# Patient Record
Sex: Male | Born: 1985 | Race: White | Hispanic: No | Marital: Married | Smoking: Never smoker
Health system: Southern US, Community
[De-identification: ages and names within clinical notes are randomized; demographics above are authoritative.]

## PROBLEM LIST (undated history)

## (undated) DIAGNOSIS — F319 Bipolar disorder, unspecified: Secondary | ICD-10-CM

## (undated) DIAGNOSIS — M109 Gout, unspecified: Secondary | ICD-10-CM

## (undated) DIAGNOSIS — I10 Essential (primary) hypertension: Secondary | ICD-10-CM

## (undated) DIAGNOSIS — G4730 Sleep apnea, unspecified: Secondary | ICD-10-CM

## (undated) DIAGNOSIS — F32A Depression, unspecified: Secondary | ICD-10-CM

## (undated) DIAGNOSIS — F329 Major depressive disorder, single episode, unspecified: Secondary | ICD-10-CM

## (undated) DIAGNOSIS — F419 Anxiety disorder, unspecified: Secondary | ICD-10-CM

## (undated) DIAGNOSIS — E785 Hyperlipidemia, unspecified: Secondary | ICD-10-CM

## (undated) DIAGNOSIS — K219 Gastro-esophageal reflux disease without esophagitis: Secondary | ICD-10-CM

## (undated) DIAGNOSIS — F909 Attention-deficit hyperactivity disorder, unspecified type: Secondary | ICD-10-CM

## (undated) DIAGNOSIS — M549 Dorsalgia, unspecified: Secondary | ICD-10-CM

## (undated) DIAGNOSIS — G8929 Other chronic pain: Secondary | ICD-10-CM

## (undated) HISTORY — DX: Bipolar disorder, unspecified: F31.9

## (undated) HISTORY — PX: KIDNEY STONE SURGERY: SHX686

## (undated) HISTORY — DX: Attention-deficit hyperactivity disorder, unspecified type: F90.9

## (undated) HISTORY — DX: Depression, unspecified: F32.A

## (undated) HISTORY — DX: Hyperlipidemia, unspecified: E78.5

## (undated) HISTORY — PX: APPENDECTOMY: SHX54

## (undated) HISTORY — DX: Anxiety disorder, unspecified: F41.9

## (undated) HISTORY — DX: Other chronic pain: G89.29

## (undated) HISTORY — DX: Dorsalgia, unspecified: M54.9

## (undated) HISTORY — DX: Major depressive disorder, single episode, unspecified: F32.9

## (undated) HISTORY — DX: Gout, unspecified: M10.9

## (undated) HISTORY — DX: Gastro-esophageal reflux disease without esophagitis: K21.9

---
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
## 2004-02-18 ENCOUNTER — Emergency Department: Payer: Self-pay | Admitting: General Practice

## 2005-06-29 ENCOUNTER — Emergency Department: Payer: Self-pay | Admitting: Emergency Medicine

## 2005-07-04 ENCOUNTER — Emergency Department: Payer: Self-pay | Admitting: Emergency Medicine

## 2006-03-22 ENCOUNTER — Other Ambulatory Visit: Payer: Self-pay

## 2006-03-22 ENCOUNTER — Emergency Department: Payer: Self-pay | Admitting: Emergency Medicine

## 2006-05-01 ENCOUNTER — Emergency Department: Payer: Self-pay | Admitting: Emergency Medicine

## 2006-10-14 ENCOUNTER — Emergency Department: Payer: Self-pay | Admitting: Emergency Medicine

## 2006-12-16 ENCOUNTER — Emergency Department: Payer: Self-pay | Admitting: Emergency Medicine

## 2007-05-25 ENCOUNTER — Emergency Department: Payer: Self-pay | Admitting: Emergency Medicine

## 2007-12-05 ENCOUNTER — Emergency Department: Payer: Self-pay | Admitting: Emergency Medicine

## 2009-02-17 ENCOUNTER — Encounter: Payer: Self-pay | Admitting: Family Medicine

## 2009-03-01 ENCOUNTER — Encounter: Payer: Self-pay | Admitting: Family Medicine

## 2010-07-23 ENCOUNTER — Ambulatory Visit: Payer: Self-pay | Admitting: Family Medicine

## 2010-07-25 ENCOUNTER — Ambulatory Visit: Payer: Self-pay | Admitting: Family Medicine

## 2012-09-12 DIAGNOSIS — N209 Urinary calculus, unspecified: Secondary | ICD-10-CM | POA: Insufficient documentation

## 2012-09-12 DIAGNOSIS — F419 Anxiety disorder, unspecified: Secondary | ICD-10-CM | POA: Insufficient documentation

## 2013-08-15 DIAGNOSIS — IMO0002 Reserved for concepts with insufficient information to code with codable children: Secondary | ICD-10-CM | POA: Insufficient documentation

## 2014-08-12 ENCOUNTER — Other Ambulatory Visit: Payer: Self-pay | Admitting: Family Medicine

## 2014-08-19 DIAGNOSIS — E782 Mixed hyperlipidemia: Secondary | ICD-10-CM

## 2014-08-19 DIAGNOSIS — F319 Bipolar disorder, unspecified: Secondary | ICD-10-CM

## 2014-08-19 DIAGNOSIS — G8929 Other chronic pain: Secondary | ICD-10-CM

## 2014-08-19 DIAGNOSIS — R740 Nonspecific elevation of levels of transaminase and lactic acid dehydrogenase [LDH]: Principal | ICD-10-CM

## 2014-08-19 DIAGNOSIS — M109 Gout, unspecified: Secondary | ICD-10-CM

## 2014-08-19 DIAGNOSIS — R7401 Elevation of levels of liver transaminase levels: Secondary | ICD-10-CM | POA: Insufficient documentation

## 2014-08-19 DIAGNOSIS — K219 Gastro-esophageal reflux disease without esophagitis: Secondary | ICD-10-CM | POA: Insufficient documentation

## 2014-08-19 DIAGNOSIS — E1169 Type 2 diabetes mellitus with other specified complication: Secondary | ICD-10-CM | POA: Insufficient documentation

## 2014-08-19 DIAGNOSIS — F909 Attention-deficit hyperactivity disorder, unspecified type: Secondary | ICD-10-CM | POA: Insufficient documentation

## 2014-08-19 DIAGNOSIS — F32A Depression, unspecified: Secondary | ICD-10-CM | POA: Insufficient documentation

## 2014-08-19 DIAGNOSIS — F419 Anxiety disorder, unspecified: Secondary | ICD-10-CM

## 2014-08-19 DIAGNOSIS — M549 Dorsalgia, unspecified: Secondary | ICD-10-CM

## 2014-08-19 DIAGNOSIS — E785 Hyperlipidemia, unspecified: Secondary | ICD-10-CM | POA: Insufficient documentation

## 2014-08-19 DIAGNOSIS — R7402 Elevation of levels of lactic acid dehydrogenase (LDH): Secondary | ICD-10-CM

## 2014-08-19 DIAGNOSIS — F329 Major depressive disorder, single episode, unspecified: Secondary | ICD-10-CM | POA: Insufficient documentation

## 2014-08-19 HISTORY — DX: Bipolar disorder, unspecified: F31.9

## 2014-08-20 ENCOUNTER — Ambulatory Visit: Payer: Self-pay | Admitting: Unknown Physician Specialty

## 2014-08-23 ENCOUNTER — Other Ambulatory Visit: Payer: Self-pay | Admitting: Unknown Physician Specialty

## 2014-08-23 MED ORDER — CLONAZEPAM 0.5 MG PO TABS: 0.5000 mg | ORAL_TABLET | Freq: Two times a day (BID) | ORAL | Status: DC | PRN

## 2014-09-08 ENCOUNTER — Other Ambulatory Visit: Payer: Self-pay | Admitting: Unknown Physician Specialty

## 2014-09-08 MED ORDER — ALLOPURINOL 300 MG PO TABS: 300.0000 mg | ORAL_TABLET | Freq: Every day | ORAL | Status: DC

## 2014-09-08 NOTE — Telephone Encounter (Signed)
I'm changing the sig for the allopurinol, he's not tapering up any more He did not keep last appt with Burna Forts -- please make sure patient has upcoming appt with Malachy Mood; he was supposed to have had liver tests done in June Thank you

## 2014-10-14 ENCOUNTER — Other Ambulatory Visit: Payer: Self-pay | Admitting: Family Medicine

## 2014-10-15 NOTE — Telephone Encounter (Signed)
Forwarding to patient's primary provider

## 2014-11-06 ENCOUNTER — Telehealth: Payer: Self-pay | Admitting: Unknown Physician Specialty

## 2014-11-06 MED ORDER — CLONAZEPAM 0.5 MG PO TABS: 0.5000 mg | ORAL_TABLET | Freq: Two times a day (BID) | ORAL | Status: DC | PRN

## 2014-11-06 NOTE — Telephone Encounter (Signed)
Needs seen further refills 

## 2014-11-06 NOTE — Telephone Encounter (Signed)
Patient was last seen 05/07/14 and practice partner number is [id number].

## 2014-11-06 NOTE — Telephone Encounter (Signed)
Pt would like a refill on klonopin

## 2014-11-14 ENCOUNTER — Other Ambulatory Visit: Payer: Self-pay | Admitting: Unknown Physician Specialty

## 2014-11-15 NOTE — Telephone Encounter (Signed)
PP reviewed; patient was due for repeat liver enzymes with CHERYL 3 months after last draw (would have been due in June) Please schedule appt with CHERYL for patient I'll be glad to refill his medicine in the meantime (last creatinine and uric acid were fine) Thank you

## 2014-11-18 NOTE — Telephone Encounter (Signed)
Pt scheduled for follow up on 11/27/14 @ 8:15am. Thanks.

## 2014-11-27 ENCOUNTER — Ambulatory Visit (INDEPENDENT_AMBULATORY_CARE_PROVIDER_SITE_OTHER): Payer: 59 | Admitting: Unknown Physician Specialty

## 2014-11-27 ENCOUNTER — Encounter: Payer: Self-pay | Admitting: Unknown Physician Specialty

## 2014-11-27 VITALS — BP 139/97 | HR 71 | Temp 98.0°F | Ht 65.6 in | Wt 231.2 lb

## 2014-11-27 DIAGNOSIS — K219 Gastro-esophageal reflux disease without esophagitis: Secondary | ICD-10-CM

## 2014-11-27 DIAGNOSIS — F329 Major depressive disorder, single episode, unspecified: Secondary | ICD-10-CM

## 2014-11-27 DIAGNOSIS — I10 Essential (primary) hypertension: Secondary | ICD-10-CM

## 2014-11-27 DIAGNOSIS — F32A Depression, unspecified: Secondary | ICD-10-CM

## 2014-11-27 DIAGNOSIS — F418 Other specified anxiety disorders: Secondary | ICD-10-CM | POA: Diagnosis not present

## 2014-11-27 DIAGNOSIS — F419 Anxiety disorder, unspecified: Secondary | ICD-10-CM

## 2014-11-27 DIAGNOSIS — M109 Gout, unspecified: Secondary | ICD-10-CM | POA: Diagnosis not present

## 2014-11-27 MED ORDER — ALLOPURINOL 300 MG PO TABS: 300.0000 mg | ORAL_TABLET | Freq: Every day | ORAL | Status: DC

## 2014-11-27 MED ORDER — CLONAZEPAM 0.5 MG PO TABS: 0.5000 mg | ORAL_TABLET | Freq: Two times a day (BID) | ORAL | Status: DC | PRN

## 2014-11-27 NOTE — Progress Notes (Signed)
   BP 139/97 mmHg  Pulse 71  Temp(Src) 98 F (36.7 C)  Ht 5' 5.6" (1.666 m)  Wt 231 lb 3.2 oz (104.872 kg)  BMI 37.78 kg/m2  SpO2 97%   Subjective:    Patient ID: Nathan Haws., male    DOB: [birth year], 29 y.o.   MRN: [id number]  HPI: Nathan Pereyra. is a 29 y.o. male  Chief Complaint  Patient presents with  . Depression  . Follow-up    pt is here for a med follow-up   Anxiety Pt has a long history of anxiety and depression and we have tried multiple medications.  He has been maintained on Clonazepam BID for about 6 years.  He is consistent with no more than twice a day.    Depression screen PHQ 2/9 11/27/2014  Decreased Interest 0  Down, Depressed, Hopeless 0  PHQ - 2 Score 0     Gout No gout attack for a year while on Allopurinol.    GERD Doing well with Nexium QD.  Getting it over the counter.    Relevant past medical, surgical, family and social history reviewed and updated as indicated. Interim medical history since our last visit reviewed. Allergies and medications reviewed and updated.  Review of Systems  Constitutional: Negative.   HENT: Negative.   Eyes: Negative.   Respiratory: Negative.   Cardiovascular: Negative.   Gastrointestinal: Negative.   Endocrine: Negative.   Genitourinary: Negative.   Skin: Negative.   Allergic/Immunologic: Negative.   Neurological: Negative.   Hematological: Negative.   Psychiatric/Behavioral: Negative.     Per HPI unless specifically indicated above     Objective:    BP 139/97 mmHg  Pulse 71  Temp(Src) 98 F (36.7 C)  Ht 5' 5.6" (1.666 m)  Wt 231 lb 3.2 oz (104.872 kg)  BMI 37.78 kg/m2  SpO2 97%  Wt Readings from Last 3 Encounters:  11/27/14 231 lb 3.2 oz (104.872 kg)  05/07/14 235 lb (106.595 kg)    Physical Exam  Constitutional: He is oriented to person, place, and time. He appears well-developed and well-nourished. No distress.  HENT:  Head: Normocephalic and atraumatic.  Eyes: Conjunctivae  and lids are normal. Right eye exhibits no discharge. Left eye exhibits no discharge. No scleral icterus.  Cardiovascular: Normal rate and regular rhythm.   Pulmonary/Chest: Effort normal. No respiratory distress.  Abdominal: Normal appearance and bowel sounds are normal. He exhibits no distension. There is no splenomegaly or hepatomegaly. There is no tenderness.  Musculoskeletal: Normal range of motion.  Neurological: He is alert and oriented to person, place, and time.  Skin: Skin is intact. No rash noted. No pallor.  Psychiatric: He has a normal mood and affect. His behavior is normal. Judgment and thought content normal.    No results found for this or any previous visit.    Assessment & Plan:   Problem List Items Addressed This Visit      Unprioritized   Anxiety and depression   GERD (gastroesophageal reflux disease)   Gout - Primary    Other Visit Diagnoses    Essential hypertension        Noted today.  First time.  Pt ed on weight loss.  Recheck at [facility]       Stable, continue present medications.  Follow up plan: Return in about 4 months (around 03/29/2015) for physical.  We will do labs then.  Special attention to BP/

## 2015-02-10 ENCOUNTER — Other Ambulatory Visit: Payer: Self-pay | Admitting: Unknown Physician Specialty

## 2015-02-10 MED ORDER — CLONAZEPAM 0.5 MG PO TABS: 0.5000 mg | ORAL_TABLET | Freq: Two times a day (BID) | ORAL | Status: DC | PRN

## 2015-02-10 NOTE — Telephone Encounter (Signed)
Pt called stated he needs a refill on Klonopin. Please call when RX is ready for pick up. Thanks.

## 2015-02-10 NOTE — Telephone Encounter (Signed)
Patient was last seen 11/27/14 and has appointment scheduled 03/21/15.

## 2015-02-10 NOTE — Telephone Encounter (Signed)
Called and let patient know rx was ready to be picked up.  

## 2015-03-21 ENCOUNTER — Ambulatory Visit (INDEPENDENT_AMBULATORY_CARE_PROVIDER_SITE_OTHER): Payer: Commercial Managed Care - HMO | Admitting: Unknown Physician Specialty

## 2015-03-21 ENCOUNTER — Encounter: Payer: Self-pay | Admitting: Unknown Physician Specialty

## 2015-03-21 VITALS — BP 141/99 | HR 80 | Temp 98.3°F | Ht 64.7 in | Wt 237.6 lb

## 2015-03-21 DIAGNOSIS — F329 Major depressive disorder, single episode, unspecified: Secondary | ICD-10-CM

## 2015-03-21 DIAGNOSIS — G4700 Insomnia, unspecified: Secondary | ICD-10-CM

## 2015-03-21 DIAGNOSIS — F1020 Alcohol dependence, uncomplicated: Secondary | ICD-10-CM

## 2015-03-21 DIAGNOSIS — Z0000 Encounter for general adult medical examination without abnormal findings: Secondary | ICD-10-CM

## 2015-03-21 DIAGNOSIS — I10 Essential (primary) hypertension: Secondary | ICD-10-CM

## 2015-03-21 DIAGNOSIS — F419 Anxiety disorder, unspecified: Secondary | ICD-10-CM

## 2015-03-21 DIAGNOSIS — E1159 Type 2 diabetes mellitus with other circulatory complications: Secondary | ICD-10-CM | POA: Insufficient documentation

## 2015-03-21 DIAGNOSIS — M109 Gout, unspecified: Secondary | ICD-10-CM | POA: Diagnosis not present

## 2015-03-21 DIAGNOSIS — F418 Other specified anxiety disorders: Secondary | ICD-10-CM

## 2015-03-21 DIAGNOSIS — F32A Depression, unspecified: Secondary | ICD-10-CM

## 2015-03-21 LAB — MICROALBUMIN, URINE WAIVED
Creatinine, Urine Waived: 100 mg/dL (ref 10–300)
Microalb, Ur Waived: 10 mg/L (ref 0–19)
Microalb/Creat Ratio: <30 mg/g (ref <30)

## 2015-03-21 MED ORDER — LISINOPRIL 5 MG PO TABS: 5.0000 mg | ORAL_TABLET | Freq: Every day | ORAL | Status: DC

## 2015-03-21 NOTE — Assessment & Plan Note (Signed)
Start Lisinopril 5 mg daily due to chronic elevated BP and multiple risk factors.

## 2015-03-21 NOTE — Assessment & Plan Note (Signed)
Stable, continue present medications.   

## 2015-03-21 NOTE — Assessment & Plan Note (Signed)
Encouraged to quit.  Pt feels this is the only thing that helps with insomnia.

## 2015-03-21 NOTE — Progress Notes (Signed)
--------------------------------- BP 141/99 mmHg  Pulse 80  Temp(Src) 98.3 F (36.8 C)  Ht 5' 4.7" (1.643 m)  Wt 237 lb 9.6 oz (107.775 kg)  BMI 39.92 kg/m2  SpO2 96%   Subjective:    Patient ID: Nathan Haws., male    DOB: May 22, 1985, 30 y.o.   MRN: [id number]  HPI: Nathan Axley. is a 30 y.o. male  Chief Complaint  Patient presents with  . Annual Exam   Anxiety Taking Clonazepam twice a day.  He has been on a nimber of meds in the past and this is the one thing that worked in long term.  He does drink ETOH which helps him sleep.  Without it, pt states he cannot sleep and has no QOL.  We have tried a number of medications that have not worked in the pase  Gout Gout is stable.    GERD Doing well since Nexium has been OTC Relevant past medical, surgical, family and social history reviewed and updated as indicated. Interim medical history since our last visit reviewed. Allergies and medications reviewed and updated.  Review of Systems  Per HPI unless specifically indicated above     Objective:    BP 141/99 mmHg  Pulse 80  Temp(Src) 98.3 F (36.8 C)  Ht 5' 4.7" (1.643 m)  Wt 237 lb 9.6 oz (107.775 kg)  BMI 39.92 kg/m2  SpO2 96%  Wt Readings from Last 3 Encounters:  03/21/15 237 lb 9.6 oz (107.775 kg)  11/27/14 231 lb 3.2 oz (104.872 kg)  05/07/14 235 lb (106.595 kg)    Physical Exam  Constitutional: He is oriented to person, place, and time. He appears well-developed and well-nourished.  HENT:  Head: Normocephalic.  Right Ear: Tympanic membrane, external ear and ear canal normal.  Left Ear: Tympanic membrane, external ear and ear canal normal.  Mouth/Throat: Uvula is midline, oropharynx is clear and moist and mucous membranes are normal.  Eyes: Pupils are equal, round, and reactive to light.  Cardiovascular: Normal rate, regular rhythm and normal heart sounds.  Exam reveals no gallop and no friction rub.   No murmur heard. Pulmonary/Chest: Effort normal  and breath sounds normal. No respiratory distress.  Abdominal: Soft. Bowel sounds are normal. He exhibits no distension. There is no tenderness.  Musculoskeletal: Normal range of motion.  Neurological: He is alert and oriented to person, place, and time. He has normal reflexes.  Skin: Skin is warm and dry.  Psychiatric: He has a normal mood and affect. His behavior is normal. Judgment and thought content normal.    No results found for this or any previous visit.    Assessment & Plan:   Problem List Items Addressed This Visit      Unprioritized   Anxiety and depression - Primary    Stable, continue present medications.        Gout    Stable, continue present medications.        Relevant Orders   Uric acid   EtOH dependence ([place])    Encouraged to quit.  Pt feels this is the only thing that helps with insomnia.      Insomnia   Hypertension    Start Lisinopril 5 mg daily due to chronic elevated BP and multiple risk factors.        Relevant Medications   lisinopril (PRINIVIL,ZESTRIL) 5 MG tablet   Other Relevant Orders   Comprehensive metabolic panel   Lipid Panel w/o Chol/HDL Ratio   Microalbumin, Urine  Waived   Uric acid    Other Visit Diagnoses    Routine general medical examination at a health care facility        Relevant Orders    HIV antibody    TSH    CBC        Follow up plan: Return in about 1 month (around 04/21/2015) for hypertension f/u.

## 2015-03-22 LAB — CBC
HEMATOCRIT: 47.3 % (ref 37.5–51.0)
Hemoglobin: 16.7 g/dL (ref 12.6–17.7)
MCH: 32.1 pg (ref 26.6–33.0)
MCHC: 35.3 g/dL (ref 31.5–35.7)
MCV: 91 fL (ref 79–97)
Platelets: 200 10*3/uL (ref 150–379)
RBC: 5.2 x10E6/uL (ref 4.14–5.80)
RDW: 13.1 % (ref 12.3–15.4)
WBC: 8.1 10*3/uL (ref 3.4–10.8)

## 2015-03-22 LAB — LIPID PANEL W/O CHOL/HDL RATIO
CHOLESTEROL TOTAL: 279 mg/dL — AB (ref 100–199)
HDL: 26 mg/dL — AB (ref >39)
Triglycerides: 443 mg/dL — ABNORMAL HIGH (ref 0–149)

## 2015-03-22 LAB — COMPREHENSIVE METABOLIC PANEL
ALK PHOS: 93 IU/L (ref 39–117)
ALT: 80 IU/L — AB (ref 0–44)
AST: 49 IU/L — AB (ref 0–40)
Albumin/Globulin Ratio: 1.8 (ref 1.1–2.5)
Albumin: 4.9 g/dL (ref 3.5–5.5)
BILIRUBIN TOTAL: 0.5 mg/dL (ref 0.0–1.2)
BUN/Creatinine Ratio: 13 (ref 8–19)
BUN: 13 mg/dL (ref 6–20)
CHLORIDE: 97 mmol/L (ref 96–106)
CO2: 25 mmol/L (ref 18–29)
CREATININE: 0.98 mg/dL (ref 0.76–1.27)
Calcium: 9.7 mg/dL (ref 8.7–10.2)
GFR calc Af Amer: 119 mL/min/{1.73_m2} (ref >59)
GFR calc non Af Amer: 103 mL/min/{1.73_m2} (ref >59)
GLUCOSE: 87 mg/dL (ref 65–99)
Globulin, Total: 2.8 g/dL (ref 1.5–4.5)
Potassium: 4.4 mmol/L (ref 3.5–5.2)
Sodium: 139 mmol/L (ref 134–144)
Total Protein: 7.7 g/dL (ref 6.0–8.5)

## 2015-03-22 LAB — URIC ACID: Uric Acid: 5.6 mg/dL (ref 3.7–8.6)

## 2015-03-22 LAB — HIV ANTIBODY (ROUTINE TESTING W REFLEX): HIV SCREEN 4TH GENERATION: NONREACTIVE

## 2015-03-22 LAB — TSH: TSH: 0.887 u[IU]/mL (ref 0.450–4.500)

## 2015-03-24 ENCOUNTER — Other Ambulatory Visit: Payer: Commercial Managed Care - HMO

## 2015-03-24 ENCOUNTER — Other Ambulatory Visit: Payer: Self-pay | Admitting: Unknown Physician Specialty

## 2015-03-24 DIAGNOSIS — E781 Pure hyperglyceridemia: Secondary | ICD-10-CM

## 2015-03-25 ENCOUNTER — Telehealth: Payer: Self-pay | Admitting: Unknown Physician Specialty

## 2015-03-25 LAB — LIPID PANEL W/O CHOL/HDL RATIO
Cholesterol, Total: 283 mg/dL — ABNORMAL HIGH (ref 100–199)
HDL: 19 mg/dL — AB (ref >39)
TRIGLYCERIDES: 885 mg/dL — AB (ref 0–149)

## 2015-03-25 MED ORDER — GEMFIBROZIL 600 MG PO TABS: 600.0000 mg | ORAL_TABLET | Freq: Two times a day (BID) | ORAL | Status: DC

## 2015-03-25 NOTE — Progress Notes (Signed)
Phone call with patient about very high Triglycerides.  Discussed lower sugar diet and to decrease ETOH.  Will start Lopid 600 mg BID.  Recehck in 6 weeks

## 2015-03-25 NOTE — Telephone Encounter (Signed)
Pt has an appt scheduled for 04/23/15. Thanks.

## 2015-03-25 NOTE — Telephone Encounter (Signed)
Discussed with patient about very high Triglycerides.  Discussed dangers of pancreatitis and decrease ETOH and sugar.  Start Lopid 600 mg BID.  Recheck in 6 weeks

## 2015-04-23 ENCOUNTER — Ambulatory Visit (INDEPENDENT_AMBULATORY_CARE_PROVIDER_SITE_OTHER): Payer: Commercial Managed Care - HMO | Admitting: Unknown Physician Specialty

## 2015-04-23 ENCOUNTER — Encounter: Payer: Self-pay | Admitting: Unknown Physician Specialty

## 2015-04-23 VITALS — BP 148/100 | HR 77 | Temp 98.4°F | Ht 65.0 in | Wt 230.4 lb

## 2015-04-23 DIAGNOSIS — R7402 Elevation of levels of lactic acid dehydrogenase (LDH): Secondary | ICD-10-CM

## 2015-04-23 DIAGNOSIS — E8881 Metabolic syndrome: Secondary | ICD-10-CM | POA: Diagnosis not present

## 2015-04-23 DIAGNOSIS — E782 Mixed hyperlipidemia: Secondary | ICD-10-CM

## 2015-04-23 DIAGNOSIS — R740 Nonspecific elevation of levels of transaminase and lactic acid dehydrogenase [LDH]: Secondary | ICD-10-CM | POA: Diagnosis not present

## 2015-04-23 DIAGNOSIS — R7401 Elevation of levels of liver transaminase levels: Secondary | ICD-10-CM

## 2015-04-23 DIAGNOSIS — I10 Essential (primary) hypertension: Secondary | ICD-10-CM | POA: Diagnosis not present

## 2015-04-23 MED ORDER — FENOFIBRATE MICRONIZED 134 MG PO CAPS: 134.0000 mg | ORAL_CAPSULE | Freq: Every day | ORAL | Status: DC

## 2015-04-23 NOTE — Progress Notes (Signed)
BP 148/100 mmHg  Pulse 77  Temp(Src) 98.4 F (36.9 C)  Ht 5\' 5"  (1.651 m)  Wt 230 lb 6.4 oz (104.509 kg)  BMI 38.34 kg/m2  SpO2 97%   Subjective:    Patient ID: Nathan Haws., male    DOB: 24-Nov-1985, 30 y.o.   MRN: [id number]  HPI: Nathan Eskra. is a 30 y.o. male  Chief Complaint  Patient presents with  . Hypertension    pt states he did not start taking the lisinopril that was given to him last visit   Hypertension Pt stated that he chose to change his diet and cut down on drinking.  He has lost 7 pounds  Elevated liver enzymes Cut back on drinking  High Triglycerides Wants to see the effect of diet and exercise.  Did not take Lipitor.    Relevant past medical, surgical, family and social history reviewed and updated as indicated. Interim medical history since our last visit reviewed. Allergies and medications reviewed and updated.  Review of Systems  Per HPI unless specifically indicated above     Objective:    BP 148/100 mmHg  Pulse 77  Temp(Src) 98.4 F (36.9 C)  Ht 5\' 5"  (1.651 m)  Wt 230 lb 6.4 oz (104.509 kg)  BMI 38.34 kg/m2  SpO2 97%  Wt Readings from Last 3 Encounters:  04/23/15 230 lb 6.4 oz (104.509 kg)  03/21/15 237 lb 9.6 oz (107.775 kg)  11/27/14 231 lb 3.2 oz (104.872 kg)    Physical Exam  Constitutional: He is oriented to person, place, and time. He appears well-developed and well-nourished. No distress.  HENT:  Head: Normocephalic and atraumatic.  Eyes: Conjunctivae and lids are normal. Right eye exhibits no discharge. Left eye exhibits no discharge. No scleral icterus.  Neck: Normal range of motion. Neck supple. No JVD present. Carotid bruit is not present.  Cardiovascular: Normal rate, regular rhythm and normal heart sounds.   Pulmonary/Chest: Effort normal and breath sounds normal. No respiratory distress.  Abdominal: Normal appearance. There is no splenomegaly or hepatomegaly.  Musculoskeletal: Normal range of motion.   Neurological: He is alert and oriented to person, place, and time.  Skin: Skin is warm, dry and intact. No rash noted. No pallor.  Psychiatric: He has a normal mood and affect. His behavior is normal. Judgment and thought content normal.    Results for orders placed or performed in visit on 03/24/15  Lipid Panel w/o Chol/HDL Ratio  Result Value Ref Range   Cholesterol, Total 283 (H) 100 - 199 mg/dL   Triglycerides 885 (HH) 0 - 149 mg/dL   HDL 19 (L) >39 mg/dL   VLDL Cholesterol Cal Comment 5 - 40 mg/dL   LDL Calculated Comment 0 - 99 mg/dL      Assessment & Plan:   Problem List Items Addressed This Visit      Unprioritized   Nonspecific elevation of levels of transaminase or lactic acid dehydrogenase (LDH)    Check CMP      Relevant Orders   Comprehensive metabolic panel   Mixed hyperlipidemia    Sample today was lipemic.  Start Arnell Asal      Relevant Medications   fenofibrate micronized (LOFIBRA) 134 MG capsule   Other Relevant Orders   Lipid Panel [place]col[place], Waived   Hypertension - Primary    Pt agrees to start Lisinopril.  He has a prescription.        Relevant Medications   fenofibrate micronized (LOFIBRA) 134  MG capsule   Metabolic syndrome       Follow up plan: Return in about 3 months (around 07/21/2015).

## 2015-04-23 NOTE — Assessment & Plan Note (Signed)
Check CMP.  ?

## 2015-04-23 NOTE — Assessment & Plan Note (Signed)
Sample today was lipemic.  Start [place]

## 2015-04-23 NOTE — Assessment & Plan Note (Signed)
Pt agrees to start Lisinopril.  He has a prescription.

## 2015-04-24 LAB — COMPREHENSIVE METABOLIC PANEL
A/G RATIO: 1.9 (ref 1.1–2.5)
ALBUMIN: 4.5 g/dL (ref 3.5–5.5)
ALK PHOS: 80 IU/L (ref 39–117)
ALT: 75 IU/L — ABNORMAL HIGH (ref 0–44)
AST: 47 IU/L — AB (ref 0–40)
BUN / CREAT RATIO: 12 (ref 8–19)
BUN: 12 mg/dL (ref 6–20)
Bilirubin Total: 0.6 mg/dL (ref 0.0–1.2)
CO2: 24 mmol/L (ref 18–29)
CREATININE: 0.99 mg/dL (ref 0.76–1.27)
Calcium: 9.6 mg/dL (ref 8.7–10.2)
Chloride: 99 mmol/L (ref 96–106)
GFR calc Af Amer: 118 mL/min/{1.73_m2} (ref >59)
GFR calc non Af Amer: 102 mL/min/{1.73_m2} (ref >59)
GLOBULIN, TOTAL: 2.4 g/dL (ref 1.5–4.5)
Glucose: 84 mg/dL (ref 65–99)
POTASSIUM: 4.8 mmol/L (ref 3.5–5.2)
SODIUM: 138 mmol/L (ref 134–144)
Total Protein: 6.9 g/dL (ref 6.0–8.5)

## 2015-04-24 LAB — LIPID PANEL W/O CHOL/HDL RATIO
CHOLESTEROL TOTAL: 255 mg/dL — AB (ref 100–199)
HDL: 22 mg/dL — ABNORMAL LOW (ref >39)
LDL CALC: 156 mg/dL — AB (ref 0–99)
Triglycerides: 384 mg/dL — ABNORMAL HIGH (ref 0–149)
VLDL Cholesterol Cal: 77 mg/dL — ABNORMAL HIGH (ref 5–40)

## 2015-04-24 LAB — SPECIMEN STATUS REPORT

## 2015-04-25 ENCOUNTER — Encounter: Payer: Self-pay | Admitting: Unknown Physician Specialty

## 2015-04-25 NOTE — Progress Notes (Signed)
Quick Note:  Letter sent. Triglycerides better and liver enzymes stable. Continue diet and exercise changes and I expect Triglycerides to continue decreasing. ______

## 2015-06-13 ENCOUNTER — Other Ambulatory Visit: Payer: Self-pay | Admitting: Unknown Physician Specialty

## 2015-07-23 ENCOUNTER — Ambulatory Visit: Payer: Commercial Managed Care - HMO | Admitting: Unknown Physician Specialty

## 2015-08-11 ENCOUNTER — Other Ambulatory Visit: Payer: Self-pay | Admitting: Unknown Physician Specialty

## 2015-08-14 ENCOUNTER — Telehealth: Payer: Self-pay | Admitting: Unknown Physician Specialty

## 2015-08-14 NOTE — Telephone Encounter (Signed)
Pt needs refill for clonazePAM (KLONOPIN) 0.5 MG tablet

## 2015-08-15 NOTE — Telephone Encounter (Signed)
Called rx into pharmacy.

## 2015-08-15 NOTE — Telephone Encounter (Signed)
Rx was written 08/12/15. Called patient to find out which pharmacy patient would like it called into and he stated [facility].

## 2015-08-25 ENCOUNTER — Other Ambulatory Visit: Payer: Self-pay | Admitting: Unknown Physician Specialty

## 2015-10-31 ENCOUNTER — Other Ambulatory Visit: Payer: Self-pay | Admitting: Unknown Physician Specialty

## 2015-11-03 ENCOUNTER — Other Ambulatory Visit: Payer: Self-pay | Admitting: Unknown Physician Specialty

## 2015-11-04 NOTE — Telephone Encounter (Signed)
Called and scheduled patient an appointment for 12/03/15.

## 2015-11-04 NOTE — Telephone Encounter (Signed)
Needs appointment for follow up ASAP- will get him enough to last to that appointment once booked

## 2015-12-03 ENCOUNTER — Ambulatory Visit: Payer: Self-pay | Admitting: Unknown Physician Specialty

## 2015-12-26 ENCOUNTER — Ambulatory Visit (INDEPENDENT_AMBULATORY_CARE_PROVIDER_SITE_OTHER): Payer: Commercial Managed Care - HMO | Admitting: Unknown Physician Specialty

## 2015-12-26 ENCOUNTER — Encounter: Payer: Self-pay | Admitting: Unknown Physician Specialty

## 2015-12-26 DIAGNOSIS — I10 Essential (primary) hypertension: Secondary | ICD-10-CM | POA: Diagnosis not present

## 2015-12-26 DIAGNOSIS — R000 Tachycardia, unspecified: Secondary | ICD-10-CM

## 2015-12-26 MED ORDER — METOPROLOL SUCCINATE ER 25 MG PO TB24: 25.0000 mg | ORAL_TABLET | Freq: Every day | ORAL | 1 refills | Status: DC

## 2015-12-26 NOTE — Assessment & Plan Note (Signed)
Rx for Metoprolol to help slow down heart rate and control BP

## 2015-12-26 NOTE — Assessment & Plan Note (Signed)
Poor control at times

## 2015-12-26 NOTE — Progress Notes (Signed)
BP (!) 136/95 (BP Location: Left Arm, Cuff Size: Large)   Pulse (!) 102   Temp 98.5 F (36.9 C)   Ht 5' 6.2" (1.681 m) Comment: pt had shoes on  Wt 240 lb 9.6 oz (109.1 kg) Comment: pt had shoes on  SpO2 97%   BMI 38.60 kg/m    Subjective:    Patient ID: Nathan Haws., male    DOB: 27-Mar-1985, 30 y.o.   MRN: [id number]  HPI: [name]. is a 30 y.o. male  Chief Complaint  Patient presents with  . ER Follow Up    pt states he went to the ER at [facility] for hypertension and tachycardia    Reviewed ER notes.  Pt presented to the ER for tachycardia and high blood pressure.  ER diagnosed pt with this due to gastric distress.  He is not sure that is the issue.  Anxiety wasn't the issue according to the pt.  He is already on Lisinopril.    Relevant past medical, surgical, family and social history reviewed and updated as indicated. Interim medical history since our last visit reviewed. Allergies and medications reviewed and updated.  Review of Systems  Per HPI unless specifically indicated above     Objective:    BP (!) 136/95 (BP Location: Left Arm, Cuff Size: Large)   Pulse (!) 102   Temp 98.5 F (36.9 C)   Ht 5' 6.2" (1.681 m) Comment: pt had shoes on  Wt 240 lb 9.6 oz (109.1 kg) Comment: pt had shoes on  SpO2 97%   BMI 38.60 kg/m   Wt Readings from Last 3 Encounters:  12/26/15 240 lb 9.6 oz (109.1 kg)  04/23/15 230 lb 6.4 oz (104.5 kg)  03/21/15 237 lb 9.6 oz (107.8 kg)    Physical Exam  Constitutional: He is oriented to person, place, and time. He appears well-developed and well-nourished. No distress.  HENT:  Head: Normocephalic and atraumatic.  Eyes: Conjunctivae and lids are normal. Right eye exhibits no discharge. Left eye exhibits no discharge. No scleral icterus.  Neck: Normal range of motion. Neck supple. No JVD present. Carotid bruit is not present.  Cardiovascular: Normal rate, regular rhythm and normal heart sounds.   Pulmonary/Chest: Effort  normal and breath sounds normal. No respiratory distress.  Abdominal: Normal appearance. There is no splenomegaly or hepatomegaly.  Musculoskeletal: Normal range of motion.  Neurological: He is alert and oriented to person, place, and time.  Skin: Skin is warm, dry and intact. No rash noted. No pallor.  Psychiatric: He has a normal mood and affect. His behavior is normal. Judgment and thought content normal.    Results for orders placed or performed in visit on 04/23/15  Comprehensive metabolic panel  Result Value Ref Range   Glucose 84 65 - 99 mg/dL   BUN 12 6 - 20 mg/dL   Creatinine, Ser 0.99 0.76 - 1.27 mg/dL   GFR calc non Af Amer 102 >59 mL/min/1.73   GFR calc Af Amer 118 >59 mL/min/1.73   BUN/Creatinine Ratio 12 8 - 19   Sodium 138 134 - 144 mmol/L   Potassium 4.8 3.5 - 5.2 mmol/L   Chloride 99 96 - 106 mmol/L   CO2 24 18 - 29 mmol/L   Calcium 9.6 8.7 - 10.2 mg/dL   Total Protein 6.9 6.0 - 8.5 g/dL   Albumin 4.5 3.5 - 5.5 g/dL   Globulin, Total 2.4 1.5 - 4.5 g/dL   Albumin/Globulin Ratio 1.9  1.1 - 2.5   Bilirubin Total 0.6 0.0 - 1.2 mg/dL   Alkaline Phosphatase 80 39 - 117 IU/L   AST 47 (H) 0 - 40 IU/L   ALT 75 (H) 0 - 44 IU/L  Lipid Panel w/o Chol/HDL Ratio  Result Value Ref Range   Cholesterol, Total 255 (H) 100 - 199 mg/dL   Triglycerides 384 (H) 0 - 149 mg/dL   HDL 22 (L) >39 mg/dL   VLDL Cholesterol Cal 77 (H) 5 - 40 mg/dL   LDL Calculated 156 (H) 0 - 99 mg/dL  Specimen status report  Result Value Ref Range   specimen status report Comment       Assessment & Plan:   Problem List Items Addressed This Visit      Unprioritized   Hypertension    Poor control at times      Relevant Medications   metoprolol succinate (TOPROL-XL) 25 MG 24 hr tablet   Tachycardia    Rx for Metoprolol to help slow down heart rate and control BP       Other Visit Diagnoses   None.      Follow up plan: Return in about 4 weeks (around 01/23/2016).

## 2015-12-29 ENCOUNTER — Other Ambulatory Visit: Payer: Self-pay | Admitting: Unknown Physician Specialty

## 2016-01-20 ENCOUNTER — Other Ambulatory Visit: Payer: Self-pay | Admitting: Unknown Physician Specialty

## 2016-01-25 ENCOUNTER — Other Ambulatory Visit: Payer: Self-pay | Admitting: Unknown Physician Specialty

## 2016-01-28 ENCOUNTER — Encounter: Payer: Self-pay | Admitting: Unknown Physician Specialty

## 2016-01-28 ENCOUNTER — Ambulatory Visit (INDEPENDENT_AMBULATORY_CARE_PROVIDER_SITE_OTHER): Payer: Commercial Managed Care - HMO | Admitting: Unknown Physician Specialty

## 2016-01-28 DIAGNOSIS — G4700 Insomnia, unspecified: Secondary | ICD-10-CM | POA: Diagnosis not present

## 2016-01-28 DIAGNOSIS — R7401 Elevation of levels of liver transaminase levels: Secondary | ICD-10-CM

## 2016-01-28 DIAGNOSIS — E782 Mixed hyperlipidemia: Secondary | ICD-10-CM

## 2016-01-28 DIAGNOSIS — F1020 Alcohol dependence, uncomplicated: Secondary | ICD-10-CM

## 2016-01-28 DIAGNOSIS — I10 Essential (primary) hypertension: Secondary | ICD-10-CM | POA: Diagnosis not present

## 2016-01-28 DIAGNOSIS — R740 Nonspecific elevation of levels of transaminase and lactic acid dehydrogenase [LDH]: Secondary | ICD-10-CM

## 2016-01-28 MED ORDER — LISINOPRIL 10 MG PO TABS: 10.0000 mg | ORAL_TABLET | Freq: Every day | ORAL | 3 refills | Status: DC

## 2016-01-28 NOTE — Progress Notes (Signed)
BP (!) 146/95 (BP Location: Left Arm, Patient Position: Sitting, Cuff Size: Large)   Pulse 89   Temp 98.3 F (36.8 C)   Wt 243 lb 3.2 oz (110.3 kg)   SpO2 97%   BMI 39.02 kg/m    Subjective:    Patient ID: [name]., male    DOB: December 22, 1985, 30 y.o.   MRN: [id number]  HPI: [name]. is a 30 y.o. male  Chief Complaint  Patient presents with  . Hypertension    pt states it has been a ruff morning at work    Hypertension Using medications without difficulty Average home BPs Not checking  No problems or lightheadedness No chest pain with exertion or shortness of breath No Edema Palpitations improved from last visit with the addition of Metoprolol Relevant past medical, surgical, family and social history reviewed and updated as indicated. Interim medical history since our last visit reviewed. Allergies and medications reviewed and updated.  ETOH Alcohol intake has been cut back to 1/2 from where he was 6 months ago.  States it is the only thing he can do to help him sleep at night.  States this is the only thing that keeps him working    Review of Systems  Per HPI unless specifically indicated above     Objective:    BP (!) 146/95 (BP Location: Left Arm, Patient Position: Sitting, Cuff Size: Large)   Pulse 89   Temp 98.3 F (36.8 C)   Wt 243 lb 3.2 oz (110.3 kg)   SpO2 97%   BMI 39.02 kg/m   Wt Readings from Last 3 Encounters:  01/28/16 243 lb 3.2 oz (110.3 kg)  12/26/15 240 lb 9.6 oz (109.1 kg)  04/23/15 230 lb 6.4 oz (104.5 kg)    Physical Exam  Constitutional: He is oriented to person, place, and time. He appears well-developed and well-nourished. No distress.  HENT:  Head: Normocephalic and atraumatic.  Eyes: Conjunctivae and lids are normal. Right eye exhibits no discharge. Left eye exhibits no discharge. No scleral icterus.  Neck: Normal range of motion. Neck supple. No JVD present. Carotid bruit is not present.  Cardiovascular: Normal  rate, regular rhythm and normal heart sounds.   Pulmonary/Chest: Effort normal and breath sounds normal. No respiratory distress.  Abdominal: Normal appearance. There is no splenomegaly or hepatomegaly.  Musculoskeletal: Normal range of motion.  Neurological: He is alert and oriented to person, place, and time.  Skin: Skin is warm, dry and intact. No rash noted. No pallor.  Psychiatric: He has a normal mood and affect. His behavior is normal. Judgment and thought content normal.    Results for orders placed or performed in visit on 04/23/15  Comprehensive metabolic panel  Result Value Ref Range   Glucose 84 65 - 99 mg/dL   BUN 12 6 - 20 mg/dL   Creatinine, Ser 0.99 0.76 - 1.27 mg/dL   GFR calc non Af Amer 102 >59 mL/min/1.73   GFR calc Af Amer 118 >59 mL/min/1.73   BUN/Creatinine Ratio 12 8 - 19   Sodium 138 134 - 144 mmol/L   Potassium 4.8 3.5 - 5.2 mmol/L   Chloride 99 96 - 106 mmol/L   CO2 24 18 - 29 mmol/L   Calcium 9.6 8.7 - 10.2 mg/dL   Total Protein 6.9 6.0 - 8.5 g/dL   Albumin 4.5 3.5 - 5.5 g/dL   Globulin, Total 2.4 1.5 - 4.5 g/dL   Albumin/Globulin Ratio 1.9 1.1 -  2.5   Bilirubin Total 0.6 0.0 - 1.2 mg/dL   Alkaline Phosphatase 80 39 - 117 IU/L   AST 47 (H) 0 - 40 IU/L   ALT 75 (H) 0 - 44 IU/L  Lipid Panel w/o Chol/HDL Ratio  Result Value Ref Range   Cholesterol, Total 255 (H) 100 - 199 mg/dL   Triglycerides 384 (H) 0 - 149 mg/dL   HDL 22 (L) >39 mg/dL   VLDL Cholesterol Cal 77 (H) 5 - 40 mg/dL   LDL Calculated 156 (H) 0 - 99 mg/dL  Specimen status report  Result Value Ref Range   specimen status report Comment       Assessment & Plan:   Problem List Items Addressed This Visit      Unprioritized   EtOH dependence ([place])    Discussed.  Unable to quit at this time      Hypertension    Not to goal.  Increase Lisinopril to 10 mg.        Relevant Medications   lisinopril (PRINIVIL,ZESTRIL) 10 MG tablet   Other Relevant Orders   Comprehensive metabolic  panel   Insomnia    Discussed sleep and ETOH       Mixed hyperlipidemia    Check today      Relevant Medications   lisinopril (PRINIVIL,ZESTRIL) 10 MG tablet   Other Relevant Orders   Comprehensive metabolic panel   Lipid Panel w/o Chol/HDL Ratio   Nonspecific elevation of levels of transaminase or lactic acid dehydrogenase (LDH)    Check CMP today          Follow up plan: Return in about 4 weeks (around 02/25/2016).

## 2016-01-28 NOTE — Assessment & Plan Note (Signed)
Check CMP today 

## 2016-01-28 NOTE — Assessment & Plan Note (Signed)
Check today 

## 2016-01-28 NOTE — Assessment & Plan Note (Signed)
Discussed.  Unable to quit at this time

## 2016-01-28 NOTE — Assessment & Plan Note (Signed)
Not to goal.  Increase Lisinopril to 10 mg.

## 2016-01-28 NOTE — Assessment & Plan Note (Signed)
Discussed sleep and ETOH

## 2016-01-28 NOTE — Patient Instructions (Addendum)
Try [employer] or [employer] for CBT for sleep  [place]

## 2016-01-29 ENCOUNTER — Other Ambulatory Visit: Payer: Self-pay | Admitting: Unknown Physician Specialty

## 2016-01-29 LAB — COMPREHENSIVE METABOLIC PANEL
A/G RATIO: 1.6 (ref 1.2–2.2)
ALBUMIN: 4.6 g/dL (ref 3.5–5.5)
ALT: 102 IU/L — ABNORMAL HIGH (ref 0–44)
AST: 82 IU/L — ABNORMAL HIGH (ref 0–40)
Alkaline Phosphatase: 97 IU/L (ref 39–117)
BILIRUBIN TOTAL: 0.5 mg/dL (ref 0.0–1.2)
BUN / CREAT RATIO: 10 (ref 9–20)
BUN: 11 mg/dL (ref 6–20)
CHLORIDE: 96 mmol/L (ref 96–106)
CO2: 22 mmol/L (ref 18–29)
Calcium: 9.5 mg/dL (ref 8.7–10.2)
Creatinine, Ser: 1.05 mg/dL (ref 0.76–1.27)
GFR calc non Af Amer: 94 mL/min/{1.73_m2} (ref >59)
GFR, EST AFRICAN AMERICAN: 109 mL/min/{1.73_m2} (ref >59)
GLOBULIN, TOTAL: 2.9 g/dL (ref 1.5–4.5)
GLUCOSE: 92 mg/dL (ref 65–99)
Potassium: 4.7 mmol/L (ref 3.5–5.2)
SODIUM: 137 mmol/L (ref 134–144)
TOTAL PROTEIN: 7.5 g/dL (ref 6.0–8.5)

## 2016-01-29 LAB — LIPID PANEL W/O CHOL/HDL RATIO
CHOLESTEROL TOTAL: 295 mg/dL — AB (ref 100–199)
HDL: 17 mg/dL — AB (ref >39)
TRIGLYCERIDES: 797 mg/dL — AB (ref 0–149)

## 2016-03-09 ENCOUNTER — Ambulatory Visit: Payer: Commercial Managed Care - HMO | Admitting: Unknown Physician Specialty

## 2016-03-10 ENCOUNTER — Other Ambulatory Visit: Payer: Self-pay | Admitting: Unknown Physician Specialty

## 2016-03-24 ENCOUNTER — Ambulatory Visit (INDEPENDENT_AMBULATORY_CARE_PROVIDER_SITE_OTHER): Payer: Commercial Managed Care - HMO | Admitting: Unknown Physician Specialty

## 2016-03-24 ENCOUNTER — Encounter: Payer: Self-pay | Admitting: Unknown Physician Specialty

## 2016-03-24 VITALS — BP 140/95 | HR 92 | Temp 97.8°F | Ht 65.9 in | Wt 243.6 lb

## 2016-03-24 DIAGNOSIS — M1A9XX0 Chronic gout, unspecified, without tophus (tophi): Secondary | ICD-10-CM | POA: Diagnosis not present

## 2016-03-24 DIAGNOSIS — I10 Essential (primary) hypertension: Secondary | ICD-10-CM | POA: Diagnosis not present

## 2016-03-24 DIAGNOSIS — F419 Anxiety disorder, unspecified: Secondary | ICD-10-CM

## 2016-03-24 DIAGNOSIS — R05 Cough: Secondary | ICD-10-CM

## 2016-03-24 DIAGNOSIS — F418 Other specified anxiety disorders: Secondary | ICD-10-CM

## 2016-03-24 DIAGNOSIS — F329 Major depressive disorder, single episode, unspecified: Secondary | ICD-10-CM

## 2016-03-24 DIAGNOSIS — F32A Depression, unspecified: Secondary | ICD-10-CM

## 2016-03-24 DIAGNOSIS — F1020 Alcohol dependence, uncomplicated: Secondary | ICD-10-CM

## 2016-03-24 DIAGNOSIS — E782 Mixed hyperlipidemia: Secondary | ICD-10-CM

## 2016-03-24 DIAGNOSIS — R059 Cough, unspecified: Secondary | ICD-10-CM

## 2016-03-24 NOTE — Progress Notes (Signed)
BP (!) 140/95   Pulse 92   Temp 97.8 F (36.6 C)   Ht 5' 5.9" (1.674 m) Comment: pt had shoes on  Wt 243 lb 9.6 oz (110.5 kg) Comment: pt had shoes on  SpO2 98%   BMI 39.44 kg/m    Subjective:    Patient ID: Nathan Haws., male    DOB: [birth year], 31 y.o.   MRN: [id number]  HPI: Nathan Cucinella. is a 31 y.o. male  Chief Complaint  Patient presents with  . Hypertension    pt states he has been taking the 5 mg of lisinopril for the past week by accident. States he took the 10 mg yesterday and today though.     Hypertension Patient reports he accidentally took only 5 mg of his lisinopril daily last week instead of 10 mg. Notes he was taking correct dosage until last week and has resumed the 10 mg for the past two days. States he has been making an effort to eat healthier lately. He does not exercise regularly, but notes that he walks and does a lot of stairs at work. Denies any edema, chest pain or shortness of breath. Admits to some headaches, which he attributes to sinus pressure he has been experiencing with a recent URI.   Anxiety Taking medications without difficulty. Notes he takes at least one clonazepam each night before bed and takes second only when needed, which he reports is about once or twice a month.  EtOH Still drinking roughly the same amount as he was at his last visit. States it is the only thing that helps him sleep at night.   Gout Taking medication without difficulty. Briefly ran out of his medicine recently, but was able to fill and resume taking it within about a week. Has not experienced any gout flares recently.  URI Patient states he has been experiencing a productive cough with some congestion, rhinorrhea, sinus pressure and sneezing for approximately 2 weeks. Reports symptoms were improving on OTC mucinex, however this medicine was causing some decreased appetite so he discontinued it. Noticed symptoms began worsening again after stopping the  mucinex and states that he plans to resume taking it today.  Relevant past medical, surgical, family and social history reviewed and updated as indicated. Interim medical history since our last visit reviewed. Allergies and medications reviewed and updated.  Review of Systems  Constitutional: Positive for activity change.  HENT: Positive for congestion, postnasal drip, rhinorrhea, sinus pain, sinus pressure and sneezing. Negative for ear pain, sore throat and trouble swallowing.   Eyes: Negative for pain, redness and visual disturbance.  Respiratory: Positive for cough. Negative for chest tightness, shortness of breath and wheezing.        SOB with exertion after 3rd floor  Cardiovascular: Negative for chest pain, palpitations and leg swelling.  Gastrointestinal: Positive for diarrhea and nausea. Negative for abdominal pain, constipation and vomiting.       Believes diarrhea is related to alcohol use  Endocrine: Negative for polydipsia and polyuria.  Genitourinary: Negative.   Musculoskeletal: Negative.   Skin: Negative for rash.  Neurological: Positive for headaches.       Believes these are related to some sinus pressure he has been experiencing.  Psychiatric/Behavioral: Negative.     Per HPI unless specifically indicated above     Objective:    BP (!) 140/95   Pulse 92   Temp 97.8 F (36.6 C)   Ht 5' 5.9" (1.674 m)  Comment: pt had shoes on  Wt 243 lb 9.6 oz (110.5 kg) Comment: pt had shoes on  SpO2 98%   BMI 39.44 kg/m   Wt Readings from Last 3 Encounters:  03/24/16 243 lb 9.6 oz (110.5 kg)  01/28/16 243 lb 3.2 oz (110.3 kg)  12/26/15 240 lb 9.6 oz (109.1 kg)    Physical Exam  Constitutional: He is oriented to person, place, and time. He appears well-developed and well-nourished. No distress.  HENT:  Head: Normocephalic and atraumatic.  Eyes: Conjunctivae are normal. Right eye exhibits no discharge. Left eye exhibits no discharge. No scleral icterus.  Cardiovascular:  Normal rate, regular rhythm and normal heart sounds.   Pulmonary/Chest: Effort normal and breath sounds normal. No respiratory distress. He has no wheezes. He has no rales.  Neurological: He is alert and oriented to person, place, and time. He has normal reflexes.  Skin: Skin is warm and dry. He is not diaphoretic.  Psychiatric: He has a normal mood and affect. His behavior is normal. Judgment and thought content normal.  Nursing note and vitals reviewed.   Results for orders placed or performed in visit on 01/28/16  Comprehensive metabolic panel  Result Value Ref Range   Glucose 92 65 - 99 mg/dL   BUN 11 6 - 20 mg/dL   Creatinine, Ser 1.05 0.76 - 1.27 mg/dL   GFR calc non Af Amer 94 >59 mL/min/1.73   GFR calc Af Amer 109 >59 mL/min/1.73   BUN/Creatinine Ratio 10 9 - 20   Sodium 137 134 - 144 mmol/L   Potassium 4.7 3.5 - 5.2 mmol/L   Chloride 96 96 - 106 mmol/L   CO2 22 18 - 29 mmol/L   Calcium 9.5 8.7 - 10.2 mg/dL   Total Protein 7.5 6.0 - 8.5 g/dL   Albumin 4.6 3.5 - 5.5 g/dL   Globulin, Total 2.9 1.5 - 4.5 g/dL   Albumin/Globulin Ratio 1.6 1.2 - 2.2   Bilirubin Total 0.5 0.0 - 1.2 mg/dL   Alkaline Phosphatase 97 39 - 117 IU/L   AST 82 (H) 0 - 40 IU/L   ALT 102 (H) 0 - 44 IU/L  Lipid Panel w/o Chol/HDL Ratio  Result Value Ref Range   Cholesterol, Total 295 (H) 100 - 199 mg/dL   Triglycerides 797 (HH) 0 - 149 mg/dL   HDL 17 (L) >39 mg/dL   VLDL Cholesterol Cal Comment 5 - 40 mg/dL   LDL Calculated Comment 0 - 99 mg/dL      Assessment & Plan:   Problem List Items Addressed This Visit      Cardiovascular and Mediastinum   Hypertension    Blood pressure high today. Patient will resume 10 mg lisinopril and return in 3 months for follow-up.        Other   Mixed hyperlipidemia    Lipid panel will be drawn at the next visit when patient is fasting.      Anxiety and depression    Will continue on current medication.      Gout    Stable. Will continue on current  medication.       Other Visit Diagnoses    Cough    -  Primary   Patient advised to continue with OTC mucinex, rest and drink plenty of hydrating fluids.       Follow up plan: Return in about 3 months (around 06/22/2016).

## 2016-03-24 NOTE — Assessment & Plan Note (Signed)
Stable. Will continue on current medication.

## 2016-03-24 NOTE — Assessment & Plan Note (Addendum)
Lipid panel will be drawn at next visit, when patient is fasting.

## 2016-03-24 NOTE — Assessment & Plan Note (Addendum)
Blood pressure high today. Patient will resume 10 mg lisinopril and return in 3 months for follow-up.

## 2016-03-24 NOTE — Assessment & Plan Note (Signed)
Will continue on current medication. 

## 2016-03-24 NOTE — Assessment & Plan Note (Signed)
Discussed. Unable to quit at this time.

## 2016-04-11 ENCOUNTER — Other Ambulatory Visit: Payer: Self-pay | Admitting: Family Medicine

## 2016-04-12 NOTE — Telephone Encounter (Signed)
Routing to provider. Appt 06/23/16.

## 2016-05-02 ENCOUNTER — Other Ambulatory Visit: Payer: Self-pay | Admitting: Unknown Physician Specialty

## 2016-05-17 ENCOUNTER — Other Ambulatory Visit: Payer: Self-pay | Admitting: Unknown Physician Specialty

## 2016-06-15 ENCOUNTER — Other Ambulatory Visit: Payer: Self-pay | Admitting: Unknown Physician Specialty

## 2016-06-17 ENCOUNTER — Telehealth: Payer: Self-pay | Admitting: Unknown Physician Specialty

## 2016-06-17 NOTE — Telephone Encounter (Signed)
Called and let patient know what Dr. Johnson said.  

## 2016-06-17 NOTE — Telephone Encounter (Signed)
Patient feels he has been having some issues with low blood sugars. He has scheduled appt for 10:30 tomorrow to see [name] but wanted to know should he fast for this appt in case she needs to do labs on him.  Thanks

## 2016-06-17 NOTE — Telephone Encounter (Signed)
Dr. Wynetta Emery, would patient need to be fasting?

## 2016-06-17 NOTE — Telephone Encounter (Signed)
I don't think he'll need to fast, and if he's having low blood sugars, I want him to eat before he comes in.

## 2016-06-18 ENCOUNTER — Encounter: Payer: Self-pay | Admitting: Unknown Physician Specialty

## 2016-06-18 ENCOUNTER — Ambulatory Visit (INDEPENDENT_AMBULATORY_CARE_PROVIDER_SITE_OTHER): Payer: Commercial Managed Care - HMO | Admitting: Unknown Physician Specialty

## 2016-06-18 DIAGNOSIS — I10 Essential (primary) hypertension: Secondary | ICD-10-CM

## 2016-06-18 DIAGNOSIS — E8881 Metabolic syndrome: Secondary | ICD-10-CM

## 2016-06-18 LAB — BAYER DCA HB A1C WAIVED: HB A1C: 5 % (ref <7.0)

## 2016-06-18 MED ORDER — LISINOPRIL 20 MG PO TABS: 20.0000 mg | ORAL_TABLET | Freq: Every day | ORAL | 3 refills | Status: DC

## 2016-06-18 NOTE — Assessment & Plan Note (Signed)
Not to goal.  Increase Lisinopril to 20 mg.

## 2016-06-18 NOTE — Assessment & Plan Note (Addendum)
Worsening symptoms.  Discussed symptoms associated with lower carb and changing body metabolism.  He was fasting when symptoms happened.

## 2016-06-18 NOTE — Progress Notes (Signed)
BP (!) 158/106 (BP Location: Left Arm, Cuff Size: Large)   Pulse 78   Temp 98.1 F (36.7 C)   Wt 240 lb 12.8 oz (109.2 kg)   SpO2 98%   BMI 38.98 kg/m    Subjective:    Patient ID: [name]., male    DOB: [birth year], 31 y.o.   MRN: [id number]  HPI: [name]. is a 31 y.o. male  Chief Complaint  Patient presents with  . other    pt states that he had a weird/strange feeling yesterday. States that it felt like slow reaction times to things throughout the day. Wonders if it could be his blood sugar.    Pt states that he was feeling poorly as described above through the day.  States he got a doughnut and felt fine the rest of the day.  Pt states he has been moody.   Hypertension Poorly controlled today Using medications without difficulty Average home BPs Not checking   No problems or lightheadedness No chest pain with exertion or shortness of breath No Edema  Past Medical History:  Diagnosis Date  . ADHD (attention deficit hyperactivity disorder)   . Anxiety   . Bipolar 1 disorder ([place])   . Chronic back pain   . Depression   . GERD (gastroesophageal reflux disease)   . Gout   . Hyperlipidemia    Family History  Problem Relation Age of Onset  . Hyperlipidemia Father   . Cancer Paternal Grandmother     pancreatic      Relevant past medical, surgical, family and social history reviewed and updated as indicated. Interim medical history since our last visit reviewed. Allergies and medications reviewed and updated.  Review of Systems  Constitutional: Positive for fatigue.  HENT: Negative.   Respiratory: Negative.   Cardiovascular: Negative.   Musculoskeletal: Negative.   Skin: Negative.   Neurological: Positive for dizziness and light-headedness.  Psychiatric/Behavioral: Positive for agitation.    Per HPI unless specifically indicated above     Objective:    BP (!) 158/106 (BP Location: Left Arm, Cuff Size: Large)   Pulse 78   Temp 98.1  F (36.7 C)   Wt 240 lb 12.8 oz (109.2 kg)   SpO2 98%   BMI 38.98 kg/m   Wt Readings from Last 3 Encounters:  06/18/16 240 lb 12.8 oz (109.2 kg)  03/24/16 243 lb 9.6 oz (110.5 kg)  01/28/16 243 lb 3.2 oz (110.3 kg)    Physical Exam  Constitutional: He is oriented to person, place, and time. He appears well-developed and well-nourished. No distress.  HENT:  Head: Normocephalic and atraumatic.  Eyes: Conjunctivae and lids are normal. Right eye exhibits no discharge. Left eye exhibits no discharge. No scleral icterus.  Neck: Normal range of motion. Neck supple. No JVD present. Carotid bruit is not present.  Cardiovascular: Normal rate, regular rhythm and normal heart sounds.   Pulmonary/Chest: Effort normal and breath sounds normal. No respiratory distress.  Abdominal: Normal appearance. There is no splenomegaly or hepatomegaly.  Musculoskeletal: Normal range of motion.  Neurological: He is alert and oriented to person, place, and time.  Skin: Skin is warm, dry and intact. No rash noted. No pallor.  Psychiatric: He has a normal mood and affect. His behavior is normal. Judgment and thought content normal.      Assessment & Plan:   Problem List Items Addressed This Visit      Unprioritized   Hypertension  Not to goal.  Increase Lisinopril to 20 mg.        Relevant Medications   lisinopril (PRINIVIL,ZESTRIL) 20 MG tablet   Metabolic syndrome    Worsening symptoms.  Discussed symptoms associated with lower carb and changing body metabolism.  He was fasting when symptoms happened.        Relevant Orders   Bayer DCA Hb A1c Waived       Follow up plan: Return for next week regular f/u.

## 2016-06-23 ENCOUNTER — Ambulatory Visit: Payer: Commercial Managed Care - HMO | Admitting: Unknown Physician Specialty

## 2016-06-29 ENCOUNTER — Other Ambulatory Visit: Payer: Self-pay | Admitting: Unknown Physician Specialty

## 2016-07-03 ENCOUNTER — Other Ambulatory Visit: Payer: Self-pay | Admitting: Unknown Physician Specialty

## 2016-07-05 ENCOUNTER — Telehealth: Payer: Self-pay

## 2016-07-05 NOTE — Telephone Encounter (Signed)
Clonazepam RX faxed to [facility].

## 2016-07-21 ENCOUNTER — Other Ambulatory Visit: Payer: Self-pay

## 2016-07-21 MED ORDER — ALLOPURINOL 300 MG PO TABS: 300.0000 mg | ORAL_TABLET | Freq: Every day | ORAL | 0 refills | Status: DC

## 2016-07-21 NOTE — Telephone Encounter (Signed)
Last (acute) OV:  Last routine OV: 06/18/16 Next OV: None on file.

## 2016-07-24 ENCOUNTER — Other Ambulatory Visit: Payer: Self-pay | Admitting: Unknown Physician Specialty

## 2016-07-26 ENCOUNTER — Other Ambulatory Visit: Payer: Self-pay | Admitting: Unknown Physician Specialty

## 2016-07-28 ENCOUNTER — Other Ambulatory Visit: Payer: Self-pay | Admitting: Unknown Physician Specialty

## 2016-08-06 ENCOUNTER — Ambulatory Visit (INDEPENDENT_AMBULATORY_CARE_PROVIDER_SITE_OTHER): Payer: Commercial Managed Care - HMO | Admitting: Family Medicine

## 2016-08-06 ENCOUNTER — Encounter: Payer: Self-pay | Admitting: Family Medicine

## 2016-08-06 VITALS — BP 129/89 | HR 89 | Temp 97.5°F | Wt 237.0 lb

## 2016-08-06 DIAGNOSIS — J069 Acute upper respiratory infection, unspecified: Secondary | ICD-10-CM | POA: Diagnosis not present

## 2016-08-06 MED ORDER — AZITHROMYCIN 250 MG PO TABS: ORAL_TABLET | ORAL | 0 refills | Status: DC

## 2016-08-06 NOTE — Progress Notes (Signed)
   BP 129/89   Pulse 89   Temp 97.5 F (36.4 C)   Wt 237 lb (107.5 kg)   SpO2 97%   BMI 38.37 kg/m    Subjective:    Patient ID: Nathan Haws., male    DOB: 03/04/1985, 31 y.o.   MRN: [id number]  HPI: Nathan Laker. is a 31 y.o. male  Chief Complaint  Patient presents with  . Sinusitis    x 3 days. head/chest congestion, productive cough, sinus drainage, runny nose, sneezing, throat sore with coughing. No fever. No ear ache.    Patient presents with almost a week of congestion, productive cough, wheezing, sinus pressure, and sore throat. Denies fever, chills, aches, CP, SOB. Has been taking allergy tablets daily and has been taking some mucinex. Several sick contacts. No hx of pulmonary dz or smoking.   Relevant past medical, surgical, family and social history reviewed and updated as indicated. Interim medical history since our last visit reviewed. Allergies and medications reviewed and updated.  Review of Systems  Constitutional: Negative.   HENT: Positive for congestion.   Eyes: Negative.   Respiratory: Positive for cough and wheezing.   Cardiovascular: Negative.   Gastrointestinal: Negative.   Genitourinary: Negative.   Musculoskeletal: Negative.   Skin: Negative.   Neurological: Negative.   Psychiatric/Behavioral: Negative.    Per HPI unless specifically indicated above     Objective:    BP 129/89   Pulse 89   Temp 97.5 F (36.4 C)   Wt 237 lb (107.5 kg)   SpO2 97%   BMI 38.37 kg/m   Wt Readings from Last 3 Encounters:  08/06/16 237 lb (107.5 kg)  06/18/16 240 lb 12.8 oz (109.2 kg)  03/24/16 243 lb 9.6 oz (110.5 kg)    Physical Exam  Constitutional: He is oriented to person, place, and time. He appears well-developed and well-nourished. No distress.  HENT:  Head: Atraumatic.  Right Ear: External ear normal.  Left Ear: External ear normal.  Thick drainage present b/l nares Oropharynx erythematous and edematous  Eyes: Conjunctivae are normal.  Pupils are equal, round, and reactive to light.  Neck: Normal range of motion. Neck supple.  Cardiovascular: Normal rate and normal heart sounds.   Pulmonary/Chest: Effort normal. No respiratory distress. He has wheezes.  Musculoskeletal: Normal range of motion.  Neurological: He is alert and oriented to person, place, and time.  Skin: Skin is warm and dry.  Psychiatric: He has a normal mood and affect. His behavior is normal.  Nursing note and vitals reviewed.     Assessment & Plan:   Problem List Items Addressed This Visit    None    Visit Diagnoses    Upper respiratory tract infection, unspecified type    -  Primary   Will treat with azithromycin, dulera sample, and plain mucinex. Discussed supportive care measures. F/u if worsening or no improvement   Relevant Medications   azithromycin (ZITHROMAX) 250 MG tablet       Follow up plan: Return if symptoms worsen or fail to improve.

## 2016-08-16 ENCOUNTER — Other Ambulatory Visit: Payer: Self-pay | Admitting: Unknown Physician Specialty

## 2016-08-18 ENCOUNTER — Telehealth: Payer: Self-pay | Admitting: Family Medicine

## 2016-08-18 MED ORDER — DOXYCYCLINE HYCLATE 100 MG PO TABS: 100.0000 mg | ORAL_TABLET | Freq: Two times a day (BID) | ORAL | 0 refills | Status: DC

## 2016-08-18 NOTE — Telephone Encounter (Signed)
Pt called and stated he isn't any better and he would like to have something sent to [facility]. Pt stated he was going out of town and would like to have it sent in as soon as possible.

## 2016-08-18 NOTE — Telephone Encounter (Signed)
Pt notified. He stated that he had already left [facility] but would call to have [facility] transferred [facility].

## 2016-08-18 NOTE — Telephone Encounter (Signed)
Routing to provider  

## 2016-08-18 NOTE — Telephone Encounter (Signed)
Sent different antibiotic to the pharmacy for him to take

## 2016-08-22 ENCOUNTER — Other Ambulatory Visit: Payer: Self-pay | Admitting: Unknown Physician Specialty

## 2016-08-31 ENCOUNTER — Other Ambulatory Visit: Payer: Self-pay | Admitting: Unknown Physician Specialty

## 2016-09-02 ENCOUNTER — Ambulatory Visit (INDEPENDENT_AMBULATORY_CARE_PROVIDER_SITE_OTHER): Payer: Commercial Managed Care - HMO | Admitting: Unknown Physician Specialty

## 2016-09-02 ENCOUNTER — Encounter: Payer: Self-pay | Admitting: Unknown Physician Specialty

## 2016-09-02 VITALS — BP 123/87 | HR 89 | Temp 98.4°F | Wt 230.4 lb

## 2016-09-02 DIAGNOSIS — K625 Hemorrhage of anus and rectum: Secondary | ICD-10-CM | POA: Diagnosis not present

## 2016-09-02 DIAGNOSIS — J320 Chronic maxillary sinusitis: Secondary | ICD-10-CM | POA: Diagnosis not present

## 2016-09-02 LAB — CBC WITH DIFFERENTIAL/PLATELET
Hematocrit: 49.4 % (ref 37.5–51.0)
Hemoglobin: 17.2 g/dL (ref 13.0–17.7)
LYMPHS ABS: 2.2 10*3/uL (ref 0.7–3.1)
LYMPHS: 27 %
MCH: 33.7 pg — ABNORMAL HIGH (ref 26.6–33.0)
MCHC: 34.8 g/dL (ref 31.5–35.7)
MCV: 97 fL (ref 79–97)
MID (Absolute): 0.6 10*3/uL (ref 0.1–1.6)
MID: 7 %
NEUTROS ABS: 5.4 10*3/uL (ref 1.4–7.0)
NEUTROS PCT: 66 %
PLATELETS: 174 10*3/uL (ref 150–379)
RBC: 5.11 x10E6/uL (ref 4.14–5.80)
RDW: 13.5 % (ref 12.3–15.4)
WBC: 8.2 10*3/uL (ref 3.4–10.8)

## 2016-09-02 NOTE — Progress Notes (Signed)
   BP 123/87   Pulse 89   Temp 98.4 F (36.9 C)   Wt 230 lb 6.4 oz (104.5 kg)   SpO2 96%   BMI 37.30 kg/m    Subjective:    Patient ID: Nathan Haws., male    DOB: 10/03/1985, 31 y.o.   MRN: [id number]  HPI: Nathan Spisak. is a 31 y.o. male  Chief Complaint  Patient presents with  . Blood In Stools    pt states that he noticed a small amount of blood in his stool starting last week but has gotten worse since then   Pt has had 1 week of bright red blood in his stool and bright red blood in the toilet.  States he feels he has a "tear" at about 6 o'clock position.    Chest congestion - pt was given Zithromax for chest congestion.  It didn't work.  States he has a productive and hacking cough in the AM.  Was called in Doxycycline but never took it.      Relevant past medical, surgical, family and social history reviewed and updated as indicated. Interim medical history since our last visit reviewed. Allergies and medications reviewed and updated.  Review of Systems  Per HPI unless specifically indicated above     Objective:    BP 123/87   Pulse 89   Temp 98.4 F (36.9 C)   Wt 230 lb 6.4 oz (104.5 kg)   SpO2 96%   BMI 37.30 kg/m   Wt Readings from Last 3 Encounters:  09/02/16 230 lb 6.4 oz (104.5 kg)  08/06/16 237 lb (107.5 kg)  06/18/16 240 lb 12.8 oz (109.2 kg)    Physical Exam  Constitutional: He is oriented to person, place, and time. He appears well-developed and well-nourished. No distress.  HENT:  Head: Normocephalic and atraumatic.  Eyes: Conjunctivae and lids are normal. Right eye exhibits no discharge. Left eye exhibits no discharge. No scleral icterus.  Neck: Normal range of motion. Neck supple. No JVD present. Carotid bruit is not present.  Cardiovascular: Normal rate, regular rhythm and normal heart sounds.   Pulmonary/Chest: Effort normal and breath sounds normal. No respiratory distress.  Abdominal: Normal appearance. There is no splenomegaly  or hepatomegaly.  Musculoskeletal: Normal range of motion.  Neurological: He is alert and oriented to person, place, and time.  Skin: Skin is warm, dry and intact. No rash noted. No pallor.  Psychiatric: He has a normal mood and affect. His behavior is normal. Judgment and thought content normal.   CBC is normal    Assessment & Plan:   Problem List Items Addressed This Visit    None    Visit Diagnoses    Rectal bleeding    -  Primary   Probably due to a rectal fissure with anal pain.  No family history of colon cancer.  Will refer to GI if persistant.  For now, mineral oil and stool softeners.   Relevant Orders   CBC With Differential/Platelet   Maxillary sinusitis, unspecified chronicity       Partially treated with Zithromax.  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  However, allergic to PCN.  Will encourage sunscreen        Follow up plan: Return if symptoms worsen or fail to improve.

## 2016-10-26 ENCOUNTER — Other Ambulatory Visit: Payer: Self-pay | Admitting: Unknown Physician Specialty

## 2016-11-25 ENCOUNTER — Other Ambulatory Visit: Payer: Self-pay | Admitting: Unknown Physician Specialty

## 2016-12-10 ENCOUNTER — Other Ambulatory Visit: Payer: Self-pay | Admitting: Family Medicine

## 2016-12-13 NOTE — Telephone Encounter (Signed)
Your patient 

## 2016-12-22 ENCOUNTER — Other Ambulatory Visit: Payer: Self-pay | Admitting: Unknown Physician Specialty

## 2016-12-23 NOTE — Telephone Encounter (Signed)
Controlled substance 

## 2017-03-18 ENCOUNTER — Other Ambulatory Visit: Payer: Self-pay | Admitting: Unknown Physician Specialty

## 2017-03-22 ENCOUNTER — Other Ambulatory Visit: Payer: Self-pay | Admitting: Unknown Physician Specialty

## 2017-03-22 NOTE — Telephone Encounter (Signed)
Needs appointment

## 2017-03-23 NOTE — Telephone Encounter (Signed)
Spoke with patient. He does not have insurance and is working on obtaining, he would like Malachy Mood if possible to call him in a month or two supply until he gets this taken care of and then he will come in to see her. If he does not have ins in that time he will just pay out of pocket for the appointment.  Please advise.  Thanks

## 2017-04-18 ENCOUNTER — Other Ambulatory Visit: Payer: Self-pay | Admitting: Unknown Physician Specialty

## 2017-06-17 ENCOUNTER — Other Ambulatory Visit: Payer: Self-pay | Admitting: Unknown Physician Specialty

## 2017-06-17 NOTE — Telephone Encounter (Signed)
Metoprolol ER 25 mg tablet refill request  LOV 06/18/16 with Kathrine Haddock - Needs appt.  [facility] [place] - [address]

## 2017-06-18 ENCOUNTER — Other Ambulatory Visit: Payer: Self-pay | Admitting: Family Medicine

## 2017-06-18 NOTE — Telephone Encounter (Signed)
Your patient 

## 2017-06-20 ENCOUNTER — Telehealth: Payer: Self-pay | Admitting: Unknown Physician Specialty

## 2017-06-20 NOTE — Telephone Encounter (Signed)
Copied from [place] [id number]. Topic: Quick Communication - See Telephone Encounter >> Jun 20, 2017  3:47 PM Ether Griffins B wrote: CRM for notification. See Telephone encounter for: 06/20/17.  Pt requesting Wicker's assistant to call him back about question regarding allergies. Wife is taking an antibiotic he is allergic to and wanting to know if intercourse could cause him a reaction. She is taking penicillin

## 2017-06-20 NOTE — Telephone Encounter (Signed)
Routing to provider to advise.  

## 2017-06-21 NOTE — Telephone Encounter (Signed)
Depends how allergic he is.  Some risk with intercourse but less risky with salivary contact (kissing)

## 2017-06-21 NOTE — Telephone Encounter (Signed)
Called and let patient know what Cheryl said.  

## 2017-06-23 ENCOUNTER — Other Ambulatory Visit: Payer: Self-pay | Admitting: Unknown Physician Specialty

## 2017-06-23 NOTE — Telephone Encounter (Signed)
Klonopin 0.5 mg refill request  Last refilled 12/24/16 for  #56.  Over a year since last CPE where this is addressed.  [facility] [place]  [address]

## 2017-06-23 NOTE — Telephone Encounter (Signed)
Pt checking status to see if his clonazePAM (KLONOPIN) 0.5 MG tablet  Can be refilled since he has made an appointment. States he cannot go until July without the medication.

## 2017-06-23 NOTE — Telephone Encounter (Signed)
Scheduled patient for 09/13/2017 due to patient's insurance and new job. Patient stated that he has just obtained a new job and is on a 54 day probation period where he is unable to take off work for the first 90 days. Patient also currently does not have insurance and for his new job his insurance will kick in around the first or second week in July. Informed patient that provider may not be able to prescribe prescription due to patient not being soon recently. Informed patient I would let the provider know.   Please Advise

## 2017-06-23 NOTE — Telephone Encounter (Signed)
Patient unable to schedule an appointment until July 1 due to starting new job.

## 2017-06-23 NOTE — Telephone Encounter (Signed)
Call pt Needs OV with CW

## 2017-06-24 NOTE — Telephone Encounter (Signed)
RX called in verbally to [facility] because they have not received a faxed RX. Patient notified that this was done.

## 2017-06-24 NOTE — Telephone Encounter (Signed)
It looks like Dr. Jeananne Rama printed it off at 4/24

## 2017-08-14 ENCOUNTER — Other Ambulatory Visit: Payer: Self-pay | Admitting: Family Medicine

## 2017-08-17 NOTE — Telephone Encounter (Signed)
Klonopin 0.5 mg refill request  LOV 09/02/16 wit Kathrine Haddock  Last refill:  06/23/17   #56   0 refills  [facility] [place] - [address]

## 2017-08-22 ENCOUNTER — Telehealth: Payer: Self-pay | Admitting: Unknown Physician Specialty

## 2017-08-24 NOTE — Telephone Encounter (Signed)
Attempted to call patient and clarify which pharmacy he is using. He has 2 prescriptions for metoprolol succinate 25 mg at 2 pharmacies. Unable to leave a message because his mail box is full.

## 2017-09-04 ENCOUNTER — Other Ambulatory Visit: Payer: Self-pay | Admitting: Unknown Physician Specialty

## 2017-09-05 MED ORDER — METOPROLOL SUCCINATE ER 25 MG PO TB24: 25.0000 mg | ORAL_TABLET | Freq: Every day | ORAL | 1 refills | Status: DC

## 2017-09-05 NOTE — Telephone Encounter (Signed)
Patient is calling and states it needs to go to   [id number] [place], [place] - [address]  [address] [place] [address]  Phone: [phone] Fax: [phone]

## 2017-09-05 NOTE — Addendum Note (Signed)
Addended by: Kathrine Haddock on: 09/05/2017 12:01 PM   Modules accepted: Orders

## 2017-09-13 ENCOUNTER — Encounter: Payer: Commercial Managed Care - HMO | Admitting: Unknown Physician Specialty

## 2017-09-19 ENCOUNTER — Encounter: Payer: Self-pay | Admitting: Unknown Physician Specialty

## 2017-09-19 ENCOUNTER — Ambulatory Visit (INDEPENDENT_AMBULATORY_CARE_PROVIDER_SITE_OTHER): Payer: Commercial Managed Care - PPO | Admitting: Unknown Physician Specialty

## 2017-09-19 DIAGNOSIS — I10 Essential (primary) hypertension: Secondary | ICD-10-CM

## 2017-09-19 DIAGNOSIS — K219 Gastro-esophageal reflux disease without esophagitis: Secondary | ICD-10-CM

## 2017-09-19 DIAGNOSIS — M1A9XX0 Chronic gout, unspecified, without tophus (tophi): Secondary | ICD-10-CM

## 2017-09-19 DIAGNOSIS — F1020 Alcohol dependence, uncomplicated: Secondary | ICD-10-CM | POA: Diagnosis not present

## 2017-09-19 DIAGNOSIS — R740 Nonspecific elevation of levels of transaminase and lactic acid dehydrogenase [LDH]: Secondary | ICD-10-CM

## 2017-09-19 DIAGNOSIS — R7401 Elevation of levels of liver transaminase levels: Secondary | ICD-10-CM

## 2017-09-19 MED ORDER — METOPROLOL SUCCINATE ER 25 MG PO TB24: 25.0000 mg | ORAL_TABLET | Freq: Every day | ORAL | 1 refills | Status: DC

## 2017-09-19 MED ORDER — ESOMEPRAZOLE MAGNESIUM 40 MG PO CPDR: 40.0000 mg | DELAYED_RELEASE_CAPSULE | Freq: Every day | ORAL | 1 refills | Status: DC

## 2017-09-19 MED ORDER — CLONAZEPAM 0.5 MG PO TABS: 0.5000 mg | ORAL_TABLET | Freq: Two times a day (BID) | ORAL | 0 refills | Status: DC

## 2017-09-19 MED ORDER — LISINOPRIL 20 MG PO TABS: 20.0000 mg | ORAL_TABLET | Freq: Every day | ORAL | 1 refills | Status: DC

## 2017-09-19 NOTE — Assessment & Plan Note (Signed)
Stable, continue present medications.   

## 2017-09-19 NOTE — Assessment & Plan Note (Signed)
Stable with Nexium

## 2017-09-19 NOTE — Assessment & Plan Note (Addendum)
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  I recommend [facility] for withdrawal treatment.  Discussed Naltrexone once past the threat of DTs

## 2017-09-19 NOTE — Assessment & Plan Note (Signed)
Probably due to ETOH.  Will check levels today

## 2017-09-19 NOTE — Assessment & Plan Note (Signed)
On A;llopurinol with no flares.

## 2017-09-19 NOTE — Progress Notes (Signed)
BP 136/90 (BP Location: Left Arm, Cuff Size: Large)   Pulse 88   Temp 98.5 F (36.9 C) (Oral)   Ht 5' 4.6" (1.641 m)   Wt 230 lb 6.4 oz (104.5 kg)   SpO2 97%   BMI 38.82 kg/m    Subjective:    Patient ID: Nathan Haws., male    DOB: 1985/04/30, 32 y.o.   MRN: [id number]  HPI: Nathan Mcquillen. is a 32 y.o. male  Chief Complaint  Patient presents with  . Gastroesophageal Reflux  . Gout  . Hypertension  . Hearing Problem    R ear, patient states he thinks he needs to have his hearing checked   Hypertension Increased BP meds: Lisinopril to 20 mg and added metoprolol.  States he is not seeing much of a difference and not sure it is working Average home BPs About what they are here  No problems or lightheadedness No chest pain with exertion or shortness of breath No Edema  Hyperlipidemia Using medications without problems No Muscle aches   ETOH Pt would like to do something about his alcohol intake.  States he drinks a pint of alcohol/day.  If he drinks he sleeps well.  If he drinks a small amount he wakes up with a panic attack.  He would like to consider taking Clonazepam instead.  States this happened pre-alcohol.    It is important to note that we tried him on a number of medications and only successful with Clonazepam.  He currently takes one Clonazepam/day and sometimes takes another.    Gout No flares since on Allopurinol   Relevant past medical, surgical, family and social history reviewed and updated as indicated. Interim medical history since our last visit reviewed. Allergies and medications reviewed and updated.  Review of Systems  Per HPI unless specifically indicated above     Objective:    BP 136/90 (BP Location: Left Arm, Cuff Size: Large)   Pulse 88   Temp 98.5 F (36.9 C) (Oral)   Ht 5' 4.6" (1.641 m)   Wt 230 lb 6.4 oz (104.5 kg)   SpO2 97%   BMI 38.82 kg/m   Wt Readings from Last 3 Encounters:  09/19/17 230 lb 6.4 oz (104.5 kg)    09/02/16 230 lb 6.4 oz (104.5 kg)  08/06/16 237 lb (107.5 kg)    Physical Exam  Constitutional: He is oriented to person, place, and time. He appears well-developed and well-nourished. No distress.  HENT:  Head: Normocephalic and atraumatic.  Eyes: Conjunctivae and lids are normal. Right eye exhibits no discharge. Left eye exhibits no discharge. No scleral icterus.  Neck: Normal range of motion. Neck supple. No JVD present. Carotid bruit is not present.  Cardiovascular: Normal rate, regular rhythm and normal heart sounds.  Pulmonary/Chest: Effort normal and breath sounds normal. No respiratory distress.  Abdominal: Normal appearance. There is no splenomegaly or hepatomegaly.  Musculoskeletal: Normal range of motion.  Neurological: He is alert and oriented to person, place, and time.  Skin: Skin is warm, dry and intact. No rash noted. No pallor.  Psychiatric: He has a normal mood and affect. His behavior is normal. Judgment and thought content normal.    Results for orders placed or performed in visit on 09/02/16  CBC With Differential/Platelet  Result Value Ref Range   WBC 8.2 3.4 - 10.8 x10E3/uL   RBC 5.11 4.14 - 5.80 x10E6/uL   Hemoglobin 17.2 13.0 - 17.7 g/dL   Hematocrit 49.4  37.5 - 51.0 %   MCV 97 79 - 97 fL   MCH 33.7 (H) 26.6 - 33.0 pg   MCHC 34.8 31.5 - 35.7 g/dL   RDW 13.5 12.3 - 15.4 %   Platelets 174 150 - 379 x10E3/uL   Neutrophils 66 Not Estab. %   Lymphs 27 Not Estab. %   MID 7 Not Estab. %   Neutrophils Absolute 5.4 1.4 - 7.0 x10E3/uL   Lymphocytes Absolute 2.2 0.7 - 3.1 x10E3/uL   MID (Absolute) 0.6 0.1 - 1.6 X10E3/uL      Assessment & Plan:   Problem List Items Addressed This Visit      Unprioritized   Elevated transaminase level    Probably due to ETOH.  Will check levels today      EtOH dependence ([place])    Concern that alcohol withdrawal would be dangerous.  I recommend [facility] for withdrawal treatment.  Discussed Naltrexone once the threat acute  threat of DTs is gone.        GERD (gastroesophageal reflux disease)    Stable with Nexium      Relevant Medications   esomeprazole (NEXIUM) 40 MG capsule   Gout    On A;llopurinol with no flares.        Hypertension    Stable, continue present medications.        Relevant Medications   metoprolol succinate (TOPROL-XL) 25 MG 24 hr tablet   lisinopril (PRINIVIL,ZESTRIL) 20 MG tablet       Follow up plan: Return in about 1 month (around 10/17/2017).

## 2017-09-20 LAB — COMPREHENSIVE METABOLIC PANEL
A/G RATIO: 2 (ref 1.2–2.2)
ALBUMIN: 4.5 g/dL (ref 3.5–5.5)
ALK PHOS: 90 IU/L (ref 39–117)
ALT: 52 IU/L — ABNORMAL HIGH (ref 0–44)
AST: 51 IU/L — ABNORMAL HIGH (ref 0–40)
BILIRUBIN TOTAL: 0.4 mg/dL (ref 0.0–1.2)
BUN / CREAT RATIO: 11 (ref 9–20)
BUN: 10 mg/dL (ref 6–20)
CHLORIDE: 100 mmol/L (ref 96–106)
CO2: 21 mmol/L (ref 20–29)
Calcium: 9.6 mg/dL (ref 8.7–10.2)
Creatinine, Ser: 0.87 mg/dL (ref 0.76–1.27)
GFR calc Af Amer: 131 mL/min/{1.73_m2} (ref >59)
GFR calc non Af Amer: 113 mL/min/{1.73_m2} (ref >59)
GLOBULIN, TOTAL: 2.3 g/dL (ref 1.5–4.5)
Glucose: 109 mg/dL — ABNORMAL HIGH (ref 65–99)
Potassium: 4.4 mmol/L (ref 3.5–5.2)
SODIUM: 140 mmol/L (ref 134–144)
Total Protein: 6.8 g/dL (ref 6.0–8.5)

## 2017-09-20 LAB — LIPID PANEL W/O CHOL/HDL RATIO
CHOLESTEROL TOTAL: 247 mg/dL — AB (ref 100–199)
HDL: 21 mg/dL — ABNORMAL LOW (ref >39)
Triglycerides: 581 mg/dL (ref 0–149)

## 2017-09-20 LAB — CBC WITH DIFFERENTIAL/PLATELET
BASOS ABS: 0 10*3/uL (ref 0.0–0.2)
Basos: 1 %
EOS (ABSOLUTE): 0.1 10*3/uL (ref 0.0–0.4)
EOS: 1 %
HEMATOCRIT: 44.3 % (ref 37.5–51.0)
HEMOGLOBIN: 15.6 g/dL (ref 13.0–17.7)
Immature Grans (Abs): 0 10*3/uL (ref 0.0–0.1)
Immature Granulocytes: 0 %
LYMPHS ABS: 2.6 10*3/uL (ref 0.7–3.1)
Lymphs: 31 %
MCH: 33.9 pg — AB (ref 26.6–33.0)
MCHC: 35.2 g/dL (ref 31.5–35.7)
MCV: 96 fL (ref 79–97)
MONOCYTES: 6 %
Monocytes Absolute: 0.5 10*3/uL (ref 0.1–0.9)
NEUTROS ABS: 5.2 10*3/uL (ref 1.4–7.0)
Neutrophils: 61 %
Platelets: 183 10*3/uL (ref 150–450)
RBC: 4.6 x10E6/uL (ref 4.14–5.80)
RDW: 13.5 % (ref 12.3–15.4)
WBC: 8.4 10*3/uL (ref 3.4–10.8)

## 2017-10-21 ENCOUNTER — Other Ambulatory Visit: Payer: Self-pay

## 2017-10-21 ENCOUNTER — Encounter: Payer: Self-pay | Admitting: Family Medicine

## 2017-10-21 ENCOUNTER — Ambulatory Visit: Payer: Commercial Managed Care - PPO | Admitting: Family Medicine

## 2017-10-21 VITALS — BP 133/88 | HR 100 | Temp 98.3°F | Ht 64.6 in | Wt 233.0 lb

## 2017-10-21 DIAGNOSIS — M1A9XX0 Chronic gout, unspecified, without tophus (tophi): Secondary | ICD-10-CM

## 2017-10-21 DIAGNOSIS — I10 Essential (primary) hypertension: Secondary | ICD-10-CM

## 2017-10-21 DIAGNOSIS — F32A Depression, unspecified: Secondary | ICD-10-CM

## 2017-10-21 DIAGNOSIS — F329 Major depressive disorder, single episode, unspecified: Secondary | ICD-10-CM

## 2017-10-21 DIAGNOSIS — G4700 Insomnia, unspecified: Secondary | ICD-10-CM

## 2017-10-21 DIAGNOSIS — F419 Anxiety disorder, unspecified: Secondary | ICD-10-CM

## 2017-10-21 DIAGNOSIS — F319 Bipolar disorder, unspecified: Secondary | ICD-10-CM

## 2017-10-21 DIAGNOSIS — F10282 Alcohol dependence with alcohol-induced sleep disorder: Secondary | ICD-10-CM

## 2017-10-21 MED ORDER — SUVOREXANT 20 MG PO TABS: 1.0000 | ORAL_TABLET | Freq: Every evening | ORAL | 0 refills | Status: DC | PRN

## 2017-10-21 NOTE — Progress Notes (Signed)
BP 133/88   Pulse 100   Temp 98.3 F (36.8 C) (Oral)   Ht 5' 4.6" (1.641 m)   Wt 233 lb (105.7 kg)   SpO2 97%   BMI 39.25 kg/m    Subjective:    Patient ID: Nathan Haws., male    DOB: [birth year], 32 y.o.   MRN: [id number]  HPI: Nathan Dimmer. is a 32 y.o. male  Chief Complaint  Patient presents with  . Hypertension    24m f/u/ pt states sleep medication f/u  . Gout  . Gastroesophageal Reflux   Here today for follow up insomnia and other chronic illnesses. Currently drinking 1/5 of jack daniels nightly and taking 1-2 klonopin alongside that. Per patient, this is the only way he can get even a few hours of sleep. States this is half of what he was drinking before and that's due to the addition of klonopin as it helps more. States he's tried lots of other things - Paxil had sexual side effects, buspar caused rage, tried seroquel and doesn't remember what happened but didn't tolerate it. In 2011 he took adderall and a sleep medication which worked.   BPs stable and WNL on current regimen. No CP, SOB, dizziness. No recent gout flares on allopurinol.   Depression screen [facility] 2/9 10/21/2017 09/19/2017 03/21/2015  Decreased Interest 0 0 0  Down, Depressed, Hopeless 0 0 0  PHQ - 2 Score 0 0 0  Altered sleeping 0 3 -  Tired, decreased energy 0 1 -  Change in appetite 0 1 -  Feeling bad or failure about yourself  0 0 -  Trouble concentrating 0 0 -  Moving slowly or fidgety/restless 0 0 -  Suicidal thoughts 0 0 -  PHQ-9 Score 0 5 -   Past Medical History:  Diagnosis Date  . ADHD (attention deficit hyperactivity disorder)   . Anxiety   . Bipolar 1 disorder ([place])   . Chronic back pain   . Depression   . GERD (gastroesophageal reflux disease)   . Gout   . Hyperlipidemia    Social History   Socioeconomic History  . Marital status: Married    Spouse name: Not on file  . Number of children: Not on file  . Years of education: Not on file  . Highest education level: Not on  file  Occupational History  . Not on file  Social Needs  . Financial resource strain: Not on file  . Food insecurity:    Worry: Not on file    Inability: Not on file  . Transportation needs:    Medical: Not on file    Non-medical: Not on file  Tobacco Use  . Smoking status: Never Smoker  . Smokeless tobacco: Current User    Types: Chew  Substance and Sexual Activity  . Alcohol use: Yes    Alcohol/week: 8.0 standard drinks    Types: 8 Shots of liquor per week  . Drug use: No  . Sexual activity: Yes  Lifestyle  . Physical activity:    Days per week: Not on file    Minutes per session: Not on file  . Stress: Not on file  Relationships  . Social connections:    Talks on phone: Not on file    Gets together: Not on file    Attends religious service: Not on file    Active member of club or organization: Not on file    Attends meetings of clubs or  organizations: Not on file    Relationship status: Not on file  . Intimate partner violence:    Fear of current or ex partner: Not on file    Emotionally abused: Not on file    Physically abused: Not on file    Forced sexual activity: Not on file  Other Topics Concern  . Not on file  Social History Narrative  . Not on file   Relevant past medical, surgical, family and social history reviewed and updated as indicated. Interim medical history since our last visit reviewed. Allergies and medications reviewed and updated.  Review of Systems  Per HPI unless specifically indicated above     Objective:    BP 133/88   Pulse 100   Temp 98.3 F (36.8 C) (Oral)   Ht 5' 4.6" (1.641 m)   Wt 233 lb (105.7 kg)   SpO2 97%   BMI 39.25 kg/m   Wt Readings from Last 3 Encounters:  10/21/17 233 lb (105.7 kg)  09/19/17 230 lb 6.4 oz (104.5 kg)  09/02/16 230 lb 6.4 oz (104.5 kg)    Physical Exam  Constitutional: He appears well-developed and well-nourished.  HENT:  Head: Atraumatic.  Eyes: Conjunctivae and EOM are normal.  Neck:  Normal range of motion. Neck supple.  Cardiovascular: Normal rate and regular rhythm.  Pulmonary/Chest: Effort normal and breath sounds normal.  Musculoskeletal: Normal range of motion.  Neurological: He is alert.  Skin: Skin is warm and dry.  Psychiatric:  Agitated  Nursing note and vitals reviewed.   Results for orders placed or performed in visit on 09/19/17  CBC with Differential/Platelet  Result Value Ref Range   WBC 8.4 3.4 - 10.8 x10E3/uL   RBC 4.60 4.14 - 5.80 x10E6/uL   Hemoglobin 15.6 13.0 - 17.7 g/dL   Hematocrit 44.3 37.5 - 51.0 %   MCV 96 79 - 97 fL   MCH 33.9 (H) 26.6 - 33.0 pg   MCHC 35.2 31.5 - 35.7 g/dL   RDW 13.5 12.3 - 15.4 %   Platelets 183 150 - 450 x10E3/uL   Neutrophils 61 Not Estab. %   Lymphs 31 Not Estab. %   Monocytes 6 Not Estab. %   Eos 1 Not Estab. %   Basos 1 Not Estab. %   Neutrophils Absolute 5.2 1.4 - 7.0 x10E3/uL   Lymphocytes Absolute 2.6 0.7 - 3.1 x10E3/uL   Monocytes Absolute 0.5 0.1 - 0.9 x10E3/uL   EOS (ABSOLUTE) 0.1 0.0 - 0.4 x10E3/uL   Basophils Absolute 0.0 0.0 - 0.2 x10E3/uL   Immature Granulocytes 0 Not Estab. %   Immature Grans (Abs) 0.0 0.0 - 0.1 x10E3/uL  Comprehensive metabolic panel  Result Value Ref Range   Glucose 109 (H) 65 - 99 mg/dL   BUN 10 6 - 20 mg/dL   Creatinine, Ser 0.87 0.76 - 1.27 mg/dL   GFR calc non Af Amer 113 >59 mL/min/1.73   GFR calc Af Amer 131 >59 mL/min/1.73   BUN/Creatinine Ratio 11 9 - 20   Sodium 140 134 - 144 mmol/L   Potassium 4.4 3.5 - 5.2 mmol/L   Chloride 100 96 - 106 mmol/L   CO2 21 20 - 29 mmol/L   Calcium 9.6 8.7 - 10.2 mg/dL   Total Protein 6.8 6.0 - 8.5 g/dL   Albumin 4.5 3.5 - 5.5 g/dL   Globulin, Total 2.3 1.5 - 4.5 g/dL   Albumin/Globulin Ratio 2.0 1.2 - 2.2   Bilirubin Total 0.4 0.0 - 1.2 mg/dL  Alkaline Phosphatase 90 39 - 117 IU/L   AST 51 (H) 0 - 40 IU/L   ALT 52 (H) 0 - 44 IU/L  Lipid Panel w/o Chol/HDL Ratio  Result Value Ref Range   Cholesterol, Total 247 (H) 100 -  199 mg/dL   Triglycerides 581 (HH) 0 - 149 mg/dL   HDL 21 (L) >39 mg/dL   VLDL Cholesterol Cal Comment 5 - 40 mg/dL   LDL Calculated Comment 0 - 99 mg/dL      Assessment & Plan:   Problem List Items Addressed This Visit      Cardiovascular and Mediastinum   Hypertension - Primary    Stable and WNL. Continue current regimen        Other   Bipolar 1 disorder (HCC)    Declines any mood stabilizers, states he's tried lots of things in the past and they make him sicker than the alcohol. Refuses psychiatry referral as he "does not have time for that and they never helped him anyway".       Anxiety and depression    PHQ-9 results today unreliable as pt marked down 0 yet appears significantly uncontrolled on anxiety, sleep, moods. Unwilling to initiate new medication today, refusing psychiatry referral.       Gout    Under good control with allopurinol and prn colchicine. Strongly encouraged d/c of alcohol and multiple treatment options given. Pt agitated and states his drinking is not the issue he has and he has no desire to quit      EtOH dependence [facility])    Lengthy discussion today about importance of alcohol cessation, especially given multiple complications at such a young age including chronic gout on 2 medications, hx of alcoholic pancreatitis, worsening mood and sleep disorders, and chronically elevated LFTs. Pt quite agitated when told he should not be taking a benzo with alcohol, particularly an entire bottle of jack daniels nightly. Do not feel comfortable refilling his klonopin, particularly as he's sometimes taking both together with that much alcohol. Will start belsomra to help with sleep in hopes this will encourage him to taper down. Was referred to a substance abuse program in June but has no interest in going. Recommended counseling or Psychiatry, he declines.       Insomnia    Will start belsomra and work on slowly tapering down on alcohol. Pt does not seem inclined in  any way to quit altogether, but perhaps if medication helps he will feel differently. Risks and benefits reviewed          Follow up plan: Return in about 4 weeks (around 11/18/2017) for Sleep f/u.

## 2017-10-24 NOTE — Assessment & Plan Note (Signed)
Stable and WNL. Continue current regimen 

## 2017-10-24 NOTE — Assessment & Plan Note (Signed)
Lengthy discussion today about importance of alcohol cessation, especially given multiple complications at such a young age including chronic gout on 2 medications, hx of alcoholic pancreatitis, worsening mood and sleep disorders, and chronically elevated LFTs. Pt quite agitated when told he should not be taking a benzo with alcohol, particularly an entire bottle of jack daniels nightly. Do not feel comfortable refilling his klonopin, particularly as he's sometimes taking both together with that much alcohol. Will start belsomra to help with sleep in hopes this will encourage him to taper down. Was referred to a substance abuse program in June but has no interest in going. Recommended counseling or Psychiatry, he declines.

## 2017-10-24 NOTE — Assessment & Plan Note (Signed)
Under good control with allopurinol and prn colchicine. Strongly encouraged d/c of alcohol and multiple treatment options given. Pt agitated and states his drinking is not the issue he has and he has no desire to quit

## 2017-10-24 NOTE — Assessment & Plan Note (Signed)
Declines any mood stabilizers, states he's tried lots of things in the past and they make him sicker than the alcohol. Refuses psychiatry referral as he "does not have time for that and they never helped him anyway".

## 2017-10-24 NOTE — Assessment & Plan Note (Signed)
PHQ-9 results today unreliable as pt marked down 0 yet appears significantly uncontrolled on anxiety, sleep, moods. Unwilling to initiate new medication today, refusing psychiatry referral.

## 2017-10-24 NOTE — Assessment & Plan Note (Signed)
Will start belsomra and work on slowly tapering down on alcohol. Pt does not seem inclined in any way to quit altogether, but perhaps if medication helps he will feel differently. Risks and benefits reviewed

## 2017-10-24 NOTE — Patient Instructions (Signed)
Follow up in 1 month   

## 2017-10-25 ENCOUNTER — Telehealth: Payer: Self-pay

## 2017-10-25 NOTE — Telephone Encounter (Signed)
P. A. For Belsomra 20 MG was denied by insurance. Plan preferences are: Zolpidem tartrate, zolpidem tartrate er, eszopiclone, zaleplon, rozerem

## 2017-10-26 MED ORDER — ZOLPIDEM TARTRATE 5 MG PO TABS: 5.0000 mg | ORAL_TABLET | Freq: Every evening | ORAL | 0 refills | Status: DC | PRN

## 2017-10-26 NOTE — Telephone Encounter (Signed)
I had given him a savings card for the belsomra that should work, but if not I sent over [place] for him to try. I don't think it will work as well as the belsomra for him but if the savings card didn't work he can start there

## 2017-10-26 NOTE — Telephone Encounter (Signed)
Left message on machine for pt to return call to the office. CRM created.  

## 2017-10-28 ENCOUNTER — Telehealth: Payer: Self-pay | Admitting: Physician Assistant

## 2017-10-28 NOTE — Telephone Encounter (Signed)
Per notes, medication was discontinued due to cost and Lorrin Mais was sent through. Will not send new Rx to pharmacy. Sent to last provider who saw him to see if we can shed some light on this.

## 2017-10-28 NOTE — Telephone Encounter (Signed)
Pt's wife states medication only cost $30.00 and would like [facility] [place].

## 2017-10-28 NOTE — Telephone Encounter (Signed)
Jaramiah Bossard pt's wife came in to inquire about having a paper prescription or to have prescription [facility] for control substance belsama

## 2017-10-28 NOTE — Telephone Encounter (Signed)
Patient notified and verbalized understanding about medications.

## 2017-10-31 NOTE — Telephone Encounter (Signed)
The [facility] was [facility] and savings card for 30 dollar copay given with instructions during OV. Ambien was then sent d/t cost per pt request. They should still have an active script [facility] but prior to me resending we will need to call over and cancel the original belsomra and the ambien.

## 2017-11-01 ENCOUNTER — Telehealth: Payer: Self-pay | Admitting: Family Medicine

## 2017-11-01 NOTE — Telephone Encounter (Signed)
Called pharmacy, [facility] ready for pick-up and will be in stock tomorrow. Tried to D/C Ambien but it was picked up on the 30th. Explained pharmacy to hold [place] until further notice till I got in touch with Apolonio Schneiders. Called Rachel's cell phone due to her being gone for the day and she agreed and the patient possibly needs to be discharged.   Called patient and he was very upset. He said that it was our fault, not his. The Belsomra wasn't at the pharmacy and the Ambien was picked up by accident. Patient was very aggressive over the phone. Patient stated "This y'alls damn fault and now I have to wait a month for my medication." I explained he may could come in and dispose of medication, patient interrupted and stated that was a problem and she knows, patient began to holler and say "now I have to find the time to get off work, to have an appointment for your screw up." I remained calm and stated Apolonio Schneiders was out for the day, but I could talk with her tomorrow. Patient was very irrate and stated we canceled the Belmomra and sent in Ambien and now he has to suffer for it. He stated he wanted to see Dr. Jeananne Rama, I explained to patient Dr. Jeananne Rama was not retiring patient state then "he'd find a new damn doctor and hung up the phone".  Gerda Diss heard conversation. Was very professional and calm with patient.

## 2017-11-01 NOTE — Telephone Encounter (Signed)
Spoke with patient's wife. She stated the only reason they said [facility] in [place] is because Belsomra wasn't in stock in [place], but will be tomorrow.   Patient wants to discontinue Ambien and take Belsomra. [facility].

## 2017-11-01 NOTE — Telephone Encounter (Signed)
[facility] already [facility] the [facility], we will need to cancel the [place] script as well

## 2017-11-01 NOTE — Telephone Encounter (Addendum)
Husband called very upset.  He said he's going to find a new provider. He also said he should have had the medication on Friday night.  He doesn't have time to chase down medication and he hung up the phone.

## 2017-11-01 NOTE — Telephone Encounter (Signed)
Copied from [place] [phone]. Topic: General - Other >> Nov 01, 2017 12:05 PM Ivar Drape wrote: Reason for CRM: Husband called in very upset.  He says he's going to find a new provider. He also said he should have had the medication on Friday night.  He doesn't have time to chase down medication and he hung up the phone. >> Nov 01, 2017  1:45 PM Lennox Solders wrote: Pt would like to know if dr Wynetta Emery will accept him as a new pt . Pt was seeing dr Julian Hy. Dr Wynetta Emery spoke to his wife heather 10-28-17 >> Nov 01, 2017  3:23 PM Shaune Pollack wrote: See message please advise thank you

## 2017-11-02 NOTE — Telephone Encounter (Signed)
Saw pt 8/23 where belsomra was ordered and savings card for 30 dollar co pay was given with instructions. Was then notified 8/27 that it wasn't covered by insurance and that insurance preferred [place], so assuming pt did not want to use copay card sent over [place] instead. Pt's wife then walks in Friday 8/30 10-15 min before 5 pm asking for [place] to transfer the belsomra to a different [facility] since [facility] told her they were out of belsomra. I was out of clinic at that time and got the message Tuesday morning after the holiday weekend. [facility] and Ambien had been picked up already on 8/30. Pt was notified that he would not be able to have the belsomra until next month as they had already picked up the [place]. See phone messages for specific conversation details.

## 2017-11-03 NOTE — Telephone Encounter (Signed)
I am not comfortable taking over the care of this patient at this time.

## 2017-11-11 ENCOUNTER — Ambulatory Visit (INDEPENDENT_AMBULATORY_CARE_PROVIDER_SITE_OTHER): Payer: Commercial Managed Care - PPO | Admitting: Unknown Physician Specialty

## 2017-11-11 ENCOUNTER — Encounter: Payer: Self-pay | Admitting: Unknown Physician Specialty

## 2017-11-11 DIAGNOSIS — G4700 Insomnia, unspecified: Secondary | ICD-10-CM

## 2017-11-11 DIAGNOSIS — I10 Essential (primary) hypertension: Secondary | ICD-10-CM

## 2017-11-11 MED ORDER — CLONAZEPAM 0.5 MG PO TABS: 0.5000 mg | ORAL_TABLET | Freq: Two times a day (BID) | ORAL | 0 refills | Status: DC | PRN

## 2017-11-11 NOTE — Assessment & Plan Note (Signed)
Did not take BP meds this AM

## 2017-11-11 NOTE — Assessment & Plan Note (Signed)
Using alcohol for sleep. Goal is to be off alcohol by Thanksgiving.  In the meantime would like Clonazepam which he is not taking but gives security.  Aware of the risks of death and respiratory suppression by mixing alcohol with controlled substances.  Goal is to try Belsomra once he is alcohol free.  Discussed CBT for sleep.  Resouces given.

## 2017-11-11 NOTE — Patient Instructions (Signed)
CBT for sleep: Check out [facility]

## 2017-11-11 NOTE — Progress Notes (Signed)
BP (!) 149/90   Pulse 67   Temp 98.4 F (36.9 C) (Oral)   Ht 5' 4.5" (1.638 m)   Wt 236 lb 9.6 oz (107.3 kg)   SpO2 98%   BMI 39.99 kg/m    Subjective:    Patient ID: Nathan Haws., male    DOB: 12-18-1985, 32 y.o.   MRN: [id number]  HPI: Nathan Pruss. is a 32 y.o. male  Chief Complaint  Patient presents with  . Insomnia    pt states he is not taking Ambien or Belsomra    Pt is here to f/u on medications.  States he was not able to get Belsomra filled.  Pt was taking .5 mg Clonazepam.  He also stopped Clonazepam.  Pt is self tapering alcohol use he does not feel he needs rehab.  He know he would like an rx for Clonazepam when he stops alcohol use.  Pt states he does not have alcohol cravings.  He understands the risks of respiratory cessation and death by mixing alcohol and Clonazepam.  He would like to try Belsomra as an option.    Relevant past medical, surgical, family and social history reviewed and updated as indicated. Interim medical history since our last visit reviewed. Allergies and medications reviewed and updated.  Review of Systems  Constitutional: Negative.   HENT: Negative.   Eyes: Negative.   Respiratory: Negative.   Cardiovascular: Negative.   Gastrointestinal: Negative.   Endocrine: Negative.   Genitourinary: Negative.   Skin: Negative.   Allergic/Immunologic: Negative.   Neurological: Negative.   Hematological: Negative.   Psychiatric/Behavioral: Negative.     Per HPI unless specifically indicated above     Objective:    BP (!) 149/90   Pulse 67   Temp 98.4 F (36.9 C) (Oral)   Ht 5' 4.5" (1.638 m)   Wt 236 lb 9.6 oz (107.3 kg)   SpO2 98%   BMI 39.99 kg/m   Wt Readings from Last 3 Encounters:  11/11/17 236 lb 9.6 oz (107.3 kg)  10/21/17 233 lb (105.7 kg)  09/19/17 230 lb 6.4 oz (104.5 kg)    Physical Exam  Constitutional: He is oriented to person, place, and time. He appears well-developed and well-nourished. No distress.    HENT:  Head: Normocephalic and atraumatic.  Eyes: Conjunctivae and lids are normal. Right eye exhibits no discharge. Left eye exhibits no discharge. No scleral icterus.  Cardiovascular: Normal rate.  Pulmonary/Chest: Effort normal.  Abdominal: Normal appearance. There is no splenomegaly or hepatomegaly.  Musculoskeletal: Normal range of motion.  Neurological: He is alert and oriented to person, place, and time.  Skin: Skin is intact. No rash noted. No pallor.  Psychiatric: He has a normal mood and affect. His behavior is normal. Judgment and thought content normal.    Results for orders placed or performed in visit on 09/19/17  CBC with Differential/Platelet  Result Value Ref Range   WBC 8.4 3.4 - 10.8 x10E3/uL   RBC 4.60 4.14 - 5.80 x10E6/uL   Hemoglobin 15.6 13.0 - 17.7 g/dL   Hematocrit 44.3 37.5 - 51.0 %   MCV 96 79 - 97 fL   MCH 33.9 (H) 26.6 - 33.0 pg   MCHC 35.2 31.5 - 35.7 g/dL   RDW 13.5 12.3 - 15.4 %   Platelets 183 150 - 450 x10E3/uL   Neutrophils 61 Not Estab. %   Lymphs 31 Not Estab. %   Monocytes 6 Not Estab. %  Eos 1 Not Estab. %   Basos 1 Not Estab. %   Neutrophils Absolute 5.2 1.4 - 7.0 x10E3/uL   Lymphocytes Absolute 2.6 0.7 - 3.1 x10E3/uL   Monocytes Absolute 0.5 0.1 - 0.9 x10E3/uL   EOS (ABSOLUTE) 0.1 0.0 - 0.4 x10E3/uL   Basophils Absolute 0.0 0.0 - 0.2 x10E3/uL   Immature Granulocytes 0 Not Estab. %   Immature Grans (Abs) 0.0 0.0 - 0.1 x10E3/uL  Comprehensive metabolic panel  Result Value Ref Range   Glucose 109 (H) 65 - 99 mg/dL   BUN 10 6 - 20 mg/dL   Creatinine, Ser 0.87 0.76 - 1.27 mg/dL   GFR calc non Af Amer 113 >59 mL/min/1.73   GFR calc Af Amer 131 >59 mL/min/1.73   BUN/Creatinine Ratio 11 9 - 20   Sodium 140 134 - 144 mmol/L   Potassium 4.4 3.5 - 5.2 mmol/L   Chloride 100 96 - 106 mmol/L   CO2 21 20 - 29 mmol/L   Calcium 9.6 8.7 - 10.2 mg/dL   Total Protein 6.8 6.0 - 8.5 g/dL   Albumin 4.5 3.5 - 5.5 g/dL   Globulin, Total 2.3 1.5 -  4.5 g/dL   Albumin/Globulin Ratio 2.0 1.2 - 2.2   Bilirubin Total 0.4 0.0 - 1.2 mg/dL   Alkaline Phosphatase 90 39 - 117 IU/L   AST 51 (H) 0 - 40 IU/L   ALT 52 (H) 0 - 44 IU/L  Lipid Panel w/o Chol/HDL Ratio  Result Value Ref Range   Cholesterol, Total 247 (H) 100 - 199 mg/dL   Triglycerides 581 (HH) 0 - 149 mg/dL   HDL 21 (L) >39 mg/dL   VLDL Cholesterol Cal Comment 5 - 40 mg/dL   LDL Calculated Comment 0 - 99 mg/dL      Assessment & Plan:   Problem List Items Addressed This Visit      Unprioritized   Hypertension    Did not take BP meds this AM      Insomnia    Using alcohol for sleep. Goal is to be off alcohol by Thanksgiving.  In the meantime would like Clonazepam which he is not taking but gives security.  Aware of the risks of death and respiratory suppression by mixing alcohol with controlled substances.  Goal is to try Belsomra once he is alcohol free.  Discussed CBT for sleep.  Resouces given.           Will see q 4 weeks for monitoring and managing care and working on sobriety. Consider Naltrexone if unable to quite ETOH on his own  Greater than 50% of this  25 minute visit was spent in counseling/coordination of care regarding sleep and alcohol use.     Follow up plan: Return in about 4 weeks (around 12/09/2017).

## 2017-11-22 ENCOUNTER — Ambulatory Visit: Payer: Commercial Managed Care - PPO | Admitting: Family Medicine

## 2017-12-08 DIAGNOSIS — M542 Cervicalgia: Secondary | ICD-10-CM | POA: Diagnosis not present

## 2017-12-08 DIAGNOSIS — H9201 Otalgia, right ear: Secondary | ICD-10-CM | POA: Diagnosis not present

## 2017-12-08 DIAGNOSIS — I6522 Occlusion and stenosis of left carotid artery: Secondary | ICD-10-CM | POA: Diagnosis not present

## 2017-12-09 ENCOUNTER — Ambulatory Visit: Payer: Commercial Managed Care - PPO | Admitting: Nurse Practitioner

## 2017-12-12 DIAGNOSIS — M542 Cervicalgia: Secondary | ICD-10-CM | POA: Diagnosis not present

## 2017-12-16 ENCOUNTER — Ambulatory Visit: Payer: Commercial Managed Care - PPO | Admitting: Nurse Practitioner

## 2018-04-02 ENCOUNTER — Other Ambulatory Visit: Payer: Self-pay | Admitting: Unknown Physician Specialty

## 2018-04-03 NOTE — Telephone Encounter (Signed)
Requested medication (s) are due for refill today: Yes  Requested medication (s) are on the active medication list: Yes  Last refill:  03/18/17  Future visit scheduled: No  Notes to clinic:  Expired, no uric acid level since 2017, unable to refill     Requested Prescriptions  Pending Prescriptions Disp Refills   allopurinol (ZYLOPRIM) 300 MG tablet [Pharmacy Med Name: ALLOPURINOL 300 MG TABLET] 90 tablet 3    Sig: TAKE [place] DAY     Endocrinology:  Gout Agents Failed - 04/02/2018  9:25 PM      Failed - Uric Acid in normal range and within 360 days    Uric Acid  Date Value Ref Range Status  03/21/2015 5.6 3.7 - 8.6 mg/dL Final    Comment:               Therapeutic target for gout patients: <6.0         Passed - Cr in normal range and within 360 days    Creatinine, Ser  Date Value Ref Range Status  09/19/2017 0.87 0.76 - 1.27 mg/dL Final         Passed - Valid encounter within last 12 months    Recent Outpatient Visits          4 months ago Insomnia, unspecified type   [facility] Kathrine Haddock, NP   5 months ago Essential hypertension   [facility] [place], [place]   6 months ago Uncomplicated alcohol dependence ([place])   [place] Kathrine Haddock, NP   1 year ago Rectal bleeding   [facility] Kathrine Haddock, NP   1 year ago Upper respiratory tract infection, unspecified type   [facility], [place], [place]

## 2018-04-04 ENCOUNTER — Other Ambulatory Visit: Payer: Self-pay | Admitting: Unknown Physician Specialty

## 2018-04-04 NOTE — Telephone Encounter (Signed)
Requested Prescriptions  Pending Prescriptions Disp Refills  . lisinopril (PRINIVIL,ZESTRIL) 20 MG tablet [Pharmacy Med Name: LISINOPRIL 20 MG TABLET] 90 tablet 0    Sig: TAKE 1 TABLET BY MOUTH EVERY DAY     Cardiovascular:  ACE Inhibitors Failed - 04/04/2018  2:06 AM      Failed - Cr in normal range and within 180 days    Creatinine, Ser  Date Value Ref Range Status  09/19/2017 0.87 0.76 - 1.27 mg/dL Final         Failed - K in normal range and within 180 days    Potassium  Date Value Ref Range Status  09/19/2017 4.4 3.5 - 5.2 mmol/L Final         Failed - Last BP in normal range    BP Readings from Last 1 Encounters:  11/11/17 (!) 149/90         Passed - Patient is not pregnant      Passed - Valid encounter within last 6 months    Recent Outpatient Visits          4 months ago Insomnia, unspecified type   [facility] Kathrine Haddock, NP   5 months ago Essential hypertension   [facility] Roof [place], [place]   6 months ago Uncomplicated alcohol dependence ([place])   [facility] Kathrine Haddock, NP   1 year ago Rectal bleeding   [facility] Kathrine Haddock, NP   1 year ago Upper respiratory tract infection, unspecified type   [facility], [place], [place]           . metoprolol succinate (TOPROL-XL) 25 MG 24 hr tablet [Pharmacy Med Name: METOPROLOL SUCC ER 25 MG TAB] 90 tablet 0    Sig: TAKE 1 TABLET BY MOUTH EVERY DAY     Cardiovascular:  Beta Blockers Failed - 04/04/2018  2:06 AM      Failed - Last BP in normal range    BP Readings from Last 1 Encounters:  11/11/17 (!) 149/90         Passed - Last Heart Rate in normal range    Pulse Readings from Last 1 Encounters:  11/11/17 67         Passed - Valid encounter within last 6 months    Recent Outpatient Visits          4 months ago Insomnia, unspecified type   [facility] Kathrine Haddock, NP   5 months ago Essential  hypertension   [facility] Ro[facility] [place], [place]   6 months ago Uncomplicated alcohol dependence ([place])   [place] Kathrine Haddock, NP   1 year ago Rectal bleeding   [facility] Kathrine Haddock, NP   1 year ago Upper respiratory tract infection, unspecified type   [facility], [place], [place]

## 2018-05-22 ENCOUNTER — Telehealth: Payer: Self-pay | Admitting: Nurse Practitioner

## 2018-05-22 DIAGNOSIS — K5289 Other specified noninfective gastroenteritis and colitis: Secondary | ICD-10-CM | POA: Diagnosis not present

## 2018-05-22 NOTE — Telephone Encounter (Signed)
Copied from [place] [id number]. Topic: General - Other >> May 22, 2018 11:07 AM Reyne Dumas L wrote: Reason for CRM:   Pt wasn't feeling well Thursday and Friday and called out of work.  Pt was told that he can't return until he has a doctors note stating he is okay to return to work.  Pt states he is feeling fine today but would need note to cover today since he is out. Pt can be reached at [phone]

## 2018-05-22 NOTE — Telephone Encounter (Signed)
Patient called back and he was scheduled for E- visit tomorrow w/ Dr. Marnee Guarneri, DNP @ 10 a.m.

## 2018-05-22 NOTE — Telephone Encounter (Signed)
Attempted to reach patient. VM box was full and unable to leave VM.

## 2018-05-23 ENCOUNTER — Telehealth: Payer: Self-pay | Admitting: Nurse Practitioner

## 2018-05-23 ENCOUNTER — Ambulatory Visit: Payer: Self-pay | Admitting: Nurse Practitioner

## 2018-05-23 NOTE — Telephone Encounter (Signed)
Thank you.  If does not come in, then can not supply note.

## 2018-05-23 NOTE — Telephone Encounter (Signed)
Called pt to see if he would be able to come in for an appointment this afternoon, no answer voicemail full unable to leave a message.

## 2018-07-06 ENCOUNTER — Other Ambulatory Visit: Payer: Self-pay | Admitting: Unknown Physician Specialty

## 2018-07-06 NOTE — Telephone Encounter (Signed)
For further refills patient will need to schedule appointment for follow-up.

## 2018-10-12 ENCOUNTER — Ambulatory Visit (INDEPENDENT_AMBULATORY_CARE_PROVIDER_SITE_OTHER): Payer: Commercial Managed Care - PPO | Admitting: Nurse Practitioner

## 2018-10-12 ENCOUNTER — Encounter: Payer: Self-pay | Admitting: Nurse Practitioner

## 2018-10-12 ENCOUNTER — Other Ambulatory Visit: Payer: Self-pay

## 2018-10-12 DIAGNOSIS — R51 Headache: Secondary | ICD-10-CM

## 2018-10-12 DIAGNOSIS — R519 Headache, unspecified: Secondary | ICD-10-CM | POA: Insufficient documentation

## 2018-10-12 MED ORDER — CYCLOBENZAPRINE HCL 10 MG PO TABS: 10.0000 mg | ORAL_TABLET | Freq: Three times a day (TID) | ORAL | 0 refills | Status: DC | PRN

## 2018-10-12 NOTE — Progress Notes (Signed)
There were no vitals taken for this visit.   Subjective:    Patient ID: [name]., male    DOB: [birth year], 33 y.o.   MRN: [id number]  HPI: [name]. is a 33 y.o. male  Chief Complaint  Patient presents with  . Headache    pt states he has had a headache for 5 days. Has tried mulitple OTC meds, states he gets no relief from these     . This visit was completed via telephone due to the restrictions of the COVID-19 pandemic. All issues as above were discussed and addressed but no physical exam was performed. If it was felt that the patient should be evaluated in the office, they were directed there. The patient verbally consented to this visit. Patient was unable to complete an audio/visual visit due to Technical difficulties,Lack of internet. Due to the catastrophic nature of the COVID-19 pandemic, this visit was done through audio contact only. . Location of the patient: home . Location of the provider: home . Those involved with this call:  . Provider: Marnee Guarneri, DNP . CMA: Yvonna Alanis, CMA . Front Desk/Registration: Jill Side  . Time spent on call: 15 minutes on the phone discussing health concerns. 10 minutes total spent in review of patient's record and preparation of their chart.  . I verified patient identity using two factors (patient name and date of birth). Patient consents verbally to being seen via telemedicine visit today.    HEADACHE: Has had a headache for 5 days.  He feels it was caused by drinking too much Saturday night and was hung over Sunday, then he pulled muscle in neck on Sunday when moving.  States his neck is stiff, but can move it.  Has had similar issue in past.  Has a history of alcohol dependence, currently drinks about 5-6 drinks a week and reports he has been cutting back.  Has tried Excedrin, Tylenol, and Ibuprofen.  Denies worst headache of life, thunderclap, or neurologic changes. Duration: days Onset: gradual  Severity: 8/10 Quality: dull, aching and throbbing "in back where muscle attaches to skull" Frequency: intermittent Location:  Headache duration: Radiation: no Time of day headache occurs:  Alleviating factors:  Aggravating factors:  Headache status at time of visit: current headache Treatments attempted: Treatments attempted: APAP, ibuprofen and aleve", excedrine   Aura: no Nausea:  yes Vomiting: no Photophobia:  no Phonophobia:  no Effect on social functioning:  yes Numbers of missed days of school/work each month: missing work today Confusion:  no Gait disturbance/ataxia:  no Behavioral changes:  no Fevers:  no  Relevant past medical, surgical, family and social history reviewed and updated as indicated. Interim medical history since our last visit reviewed. Allergies and medications reviewed and updated.  Review of Systems  Constitutional: Negative for activity change, diaphoresis, fatigue and fever.  Respiratory: Negative for cough, chest tightness, shortness of breath and wheezing.   Cardiovascular: Negative for chest pain, palpitations and leg swelling.  Gastrointestinal: Negative for abdominal distention, abdominal pain, constipation, diarrhea, nausea and vomiting.  Neurological: Positive for headaches. Negative for dizziness, syncope, weakness, light-headedness and numbness.  Psychiatric/Behavioral: Negative.     Per HPI unless specifically indicated above     Objective:    There were no vitals taken for this visit.  Wt Readings from Last 3 Encounters:  11/11/17 236 lb 9.6 oz (107.3 kg)  10/21/17 233 lb (105.7 kg)  09/19/17 230 lb 6.4 oz (104.5 kg)  Physical Exam   Unable to perform due to telephone visit only.  Results for orders placed or performed in visit on 09/19/17  CBC with Differential/Platelet  Result Value Ref Range   WBC 8.4 3.4 - 10.8 x10E3/uL   RBC 4.60 4.14 - 5.80 x10E6/uL   Hemoglobin 15.6 13.0 - 17.7 g/dL   Hematocrit 44.3 37.5 -  51.0 %   MCV 96 79 - 97 fL   MCH 33.9 (H) 26.6 - 33.0 pg   MCHC 35.2 31.5 - 35.7 g/dL   RDW 13.5 12.3 - 15.4 %   Platelets 183 150 - 450 x10E3/uL   Neutrophils 61 Not Estab. %   Lymphs 31 Not Estab. %   Monocytes 6 Not Estab. %   Eos 1 Not Estab. %   Basos 1 Not Estab. %   Neutrophils Absolute 5.2 1.4 - 7.0 x10E3/uL   Lymphocytes Absolute 2.6 0.7 - 3.1 x10E3/uL   Monocytes Absolute 0.5 0.1 - 0.9 x10E3/uL   EOS (ABSOLUTE) 0.1 0.0 - 0.4 x10E3/uL   Basophils Absolute 0.0 0.0 - 0.2 x10E3/uL   Immature Granulocytes 0 Not Estab. %   Immature Grans (Abs) 0.0 0.0 - 0.1 x10E3/uL  Comprehensive metabolic panel  Result Value Ref Range   Glucose 109 (H) 65 - 99 mg/dL   BUN 10 6 - 20 mg/dL   Creatinine, Ser 0.87 0.76 - 1.27 mg/dL   GFR calc non Af Amer 113 >59 mL/min/1.73   GFR calc Af Amer 131 >59 mL/min/1.73   BUN/Creatinine Ratio 11 9 - 20   Sodium 140 134 - 144 mmol/L   Potassium 4.4 3.5 - 5.2 mmol/L   Chloride 100 96 - 106 mmol/L   CO2 21 20 - 29 mmol/L   Calcium 9.6 8.7 - 10.2 mg/dL   Total Protein 6.8 6.0 - 8.5 g/dL   Albumin 4.5 3.5 - 5.5 g/dL   Globulin, Total 2.3 1.5 - 4.5 g/dL   Albumin/Globulin Ratio 2.0 1.2 - 2.2   Bilirubin Total 0.4 0.0 - 1.2 mg/dL   Alkaline Phosphatase 90 39 - 117 IU/L   AST 51 (H) 0 - 40 IU/L   ALT 52 (H) 0 - 44 IU/L  Lipid Panel w/o Chol/HDL Ratio  Result Value Ref Range   Cholesterol, Total 247 (H) 100 - 199 mg/dL   Triglycerides 581 (HH) 0 - 149 mg/dL   HDL 21 (L) >39 mg/dL   VLDL Cholesterol Cal Comment 5 - 40 mg/dL   LDL Calculated Comment 0 - 99 mg/dL      Assessment & Plan:   Problem List Items Addressed This Visit      Other   Headache    Acute, suspect secondary to posterior neck muscle strain.  Patient denies red flag symptoms.  Script for Flexeril sent to take as needed, recommend not to take this with alcohol.  Advise to continue to cut back on alcohol intake.  May use minimal Tylenol and Ibuprofen, discussed preferably using OTC  Biofreeze or gels to neck for discomfort vs oral medications due to HTN and alcohol intake history.  Alternate ice/heat to neck for comfort. He is aware to return to office or immediately go to ER for worsening symptoms.        Relevant Medications   cyclobenzaprine (FLEXERIL) 10 MG tablet      I discussed the assessment and treatment plan with the patient. The patient was provided an opportunity to ask questions and all were answered. The patient agreed with the  plan and demonstrated an understanding of the instructions.   The patient was advised to call back or seek an in-person evaluation if the symptoms worsen or if the condition fails to improve as anticipated.   I provided 15 minutes of time during this encounter.  Follow up plan: Return if symptoms worsen or fail to improve.

## 2018-10-12 NOTE — Assessment & Plan Note (Signed)
Acute, suspect secondary to posterior neck muscle strain.  Patient denies red flag symptoms.  Script for Flexeril sent to take as needed, recommend not to take this with alcohol.  Advise to continue to cut back on alcohol intake.  May use minimal Tylenol and Ibuprofen, discussed preferably using OTC Biofreeze or gels to neck for discomfort vs oral medications due to HTN and alcohol intake history.  Alternate ice/heat to neck for comfort. He is aware to return to office or immediately go to ER for worsening symptoms.

## 2018-10-12 NOTE — Patient Instructions (Signed)
General Headache Without Cause °A headache is pain or discomfort that is felt around the head or neck area. There are many causes and types of headaches. In some cases, the cause may not be found. °Follow these instructions at home: °Watch your condition for any changes. Let your doctor know about them. Take these steps to help with your condition: °Managing pain ° °  ° °· Take over-the-counter and prescription medicines only as told by your doctor. °· Lie down in a dark, quiet room when you have a headache. °· If told, put ice on your head and neck area: °? Put ice in a plastic bag. °? Place a towel between your skin and the bag. °? Leave the ice on for 20 minutes, 2-3 times per day. °· If told, put heat on the affected area. Use the heat source that your doctor recommends, such as a moist heat pack or a heating pad. °? Place a towel between your skin and the heat source. °? Leave the heat on for 20-30 minutes. °? Remove the heat if your skin turns bright red. This is very important if you are unable to feel pain, heat, or cold. You may have a greater risk of getting burned. °· Keep lights dim if bright lights bother you or make your headaches worse. °Eating and drinking °· Eat meals on a regular schedule. °· If you drink alcohol: °? Limit how much you use to: °§ 0-1 drink a day for women. °§ 0-2 drinks a day for men. °? Be aware of how much alcohol is in your drink. In the [place] one drink equals one 12 oz bottle of beer (355 mL), one 5 oz glass of wine (148 mL), or one 1½ oz glass of hard liquor (44 mL). °· Stop drinking caffeine, or reduce how much caffeine you drink. °General instructions ° °· Keep a journal to find out if certain things bring on headaches. For example, write down: °? What you eat and drink. °? How much sleep you get. °? Any change to your diet or medicines. °· Get a massage or try other ways to relax. °· Limit stress. °· Sit up straight. Do not tighten (tense) your muscles. °· Do not use any  products that contain nicotine or tobacco. This includes cigarettes, e-cigarettes, and chewing tobacco. If you need help quitting, ask your doctor. °· Exercise regularly as told by your doctor. °· Get enough sleep. This often means 7-9 hours of sleep each night. °· Keep all follow-up visits as told by your doctor. This is important. °Contact a doctor if: °· Your symptoms are not helped by medicine. °· You have a headache that feels different than the other headaches. °· You feel sick to your stomach (nauseous) or you throw up (vomit). °· You have a fever. °Get help right away if: °· Your headache gets very bad quickly. °· Your headache gets worse after a lot of physical activity. °· You keep throwing up. °· You have a stiff neck. °· You have trouble seeing. °· You have trouble speaking. °· You have pain in the eye or ear. °· Your muscles are weak or you lose muscle control. °· You lose your balance or have trouble walking. °· You feel like you will pass out (faint) or you pass out. °· You are mixed up (confused). °· You have a seizure. °Summary °· A headache is pain or discomfort that is felt around the head or neck area. °· There are many causes and   types of headaches. In some cases, the cause may not be found. °· Keep a journal to help find out what causes your headaches. Watch your condition for any changes. Let your doctor know about them. °· Contact a doctor if you have a headache that is different from usual, or if your headache is not helped by medicine. °· Get help right away if your headache gets very bad, you throw up, you have trouble seeing, you lose your balance, or you have a seizure. °This information is not intended to replace advice given to you by your health care provider. Make sure you discuss any questions you have with your health care provider. °Document Released: 11/25/2007 Document Revised: 09/05/2017 Document Reviewed: 09/05/2017 °Elsevier Patient Education © 2020 [employer] ° °

## 2018-10-12 NOTE — Progress Notes (Signed)
Work excuse

## 2018-10-16 ENCOUNTER — Ambulatory Visit: Payer: Self-pay | Admitting: *Deleted

## 2018-10-16 NOTE — Telephone Encounter (Signed)
Noted.  I mentioned to him during visit not to taking while driving or working, only when resting.

## 2018-10-16 NOTE — Telephone Encounter (Signed)
Called with dizziness several hours after taking flexeril 10 mg tab as prescribed for a sore muscle. Has taken for 4 days without this side effect. He was climbing a ladder when the dizziness occurred, now sitting on the ground. Also reports blurred vision. Denies CP/SOB/Fever. Feels shaky. Reports he ate breakfast around 7am and drinking fluids. Advised stay seated and drink fluids. Attempted to reach PCP. Advised lying with feet up/eat crackers and sip on juice. No answer at PCP. Attempted PCP for acute appointment, no answer. Advised Urgent care or ED visit for someone to drive him. Patient inquired about taking his anti anxiety medication-advised against taking at this time. Stated he understood recommendations.   Reason for Disposition . [1] Caller has URGENT medication question about med that PCP or specialist prescribed AND [2] triager unable to answer question . [1] MODERATE dizziness (e.g., interferes with normal activities) AND [2] has NOT been evaluated by physician for this  (Exception: dizziness caused by heat exposure, sudden standing, or poor fluid intake)  Answer Assessment - Initial Assessment Questions 1.   NAME of MEDICATION: "What medicine are you calling about?"     Flexeril 2.   QUESTION: "What is your question?"     Has taken it for 4 days and this is the first time he has felt dizzy after taking it. 3.   PRESCRIBING HCP: "Who prescribed it?" Reason: if prescribed by specialist, call should be referred to that group.     Pulled muscle 4. SYMPTOMS: "Do you have any symptoms?"     Stated blurred vision  5. SEVERITY: If symptoms are present, ask "Are they mild, moderate or severe?"     severe 6.  PREGNANCY:  "Is there any chance that you are pregnant?" "When was your last menstrual period?"     na  Answer Assessment - Initial Assessment Questions 1. DESCRIPTION: "Describe your dizziness."    lightheaded 2. LIGHTHEADED: "Do you feel lightheaded?" (e.g., somewhat faint,  woozy, weak upon standing)     yes 3. VERTIGO: "Do you feel like either you or the room is spinning or tilting?" (i.e. vertigo)    unsure 4. SEVERITY: "How bad is it?"  "Do you feel like you are going to faint?" "Can you stand and walk?"   - MILD - walking normally   - MODERATE - interferes with normal activities (e.g., work, school)    - SEVERE - unable to stand, requires support to walk, feels like passing out now.      severe 5. ONSET:  "When did the dizziness begin?"     Just now 6. AGGRAVATING FACTORS: "Does anything make it worse?" (e.g., standing, change in head position)     Not going to stand up 7. HEART RATE: "Can you tell me your heart rate?" "How many beats in 15 seconds?"  (Note: not all patients can do this)        8. CAUSE: "What do you think is causing the dizziness?"     flexeril 9. RECURRENT SYMPTOM: "Have you had dizziness before?" If so, ask: "When was the last time?" "What happened that time?"     no 10. OTHER SYMPTOMS: "Do you have any other symptoms?" (e.g., fever, chest pain, vomiting, diarrhea, bleeding)       no 11. PREGNANCY: "Is there any chance you are pregnant?" "When was your last menstrual period?"  Protocols used: MEDICATION QUESTION CALL-A-AH, DIZZINESS - [facility]

## 2018-10-16 NOTE — Telephone Encounter (Signed)
Pt called back in stating that he hadn't received a call back. Pt states that he did not go to UC or ED. Talked to [facility] she wanted me to tell pt that he should hold off on taking medication for 24 hrs and to only take medication when at night while resting, not working or driving and not with alcohol. Jolene also wanted to inform pt that if he starts feeling like he did before to go to [facility] or ED. Pt verbalized understanding.

## 2018-10-17 ENCOUNTER — Ambulatory Visit: Payer: Self-pay

## 2018-10-17 NOTE — Telephone Encounter (Signed)
Pt. Reports he went home after his episode of dizziness yesterday and rested. Has not taken any more Flexeril. Feels anxious this morning. Feels like he needs "some general lab work to check things out." Is not dizzy today. Denies any other symptoms.Spoke to Mathews in the practice and will send triage for review. Pt. Verbalizes understanding.  Answer Assessment - Initial Assessment Questions 1. DESCRIPTION: "Describe your dizziness."     No dizziness today 2. LIGHTHEADED: "Do you feel lightheaded?" (e.g., somewhat faint, woozy, weak upon standing)     Lightheaded yesterday 3. VERTIGO: "Do you feel like either you or the room is spinning or tilting?" (i.e. vertigo)     No 4. SEVERITY: "How bad is it?"  "Do you feel like you are going to faint?" "Can you stand and walk?"   - MILD - walking normally   - MODERATE - interferes with normal activities (e.g., work, school)    - SEVERE - unable to stand, requires support to walk, feels like passing out now.      None day 5. ONSET:  "When did the dizziness begin?"     Yesterday 6. AGGRAVATING FACTORS: "Does anything make it worse?" (e.g., standing, change in head position)     Was working in the heat 7. HEART RATE: "Can you tell me your heart rate?" "How many beats in 15 seconds?"  (Note: not all patients can do this)       N/A 8. CAUSE: "What do you think is causing the dizziness?"     Unsure 9. RECURRENT SYMPTOM: "Have you had dizziness before?" If so, ask: "When was the last time?" "What happened that time?"     No 10. OTHER SYMPTOMS: "Do you have any other symptoms?" (e.g., fever, chest pain, vomiting, diarrhea, bleeding)       Anxiety 11. PREGNANCY: "Is there any chance you are pregnant?" "When was your last menstrual period?"       n/a  Protocols used: DIZZINESS [facility]

## 2018-10-17 NOTE — Telephone Encounter (Signed)
Called pt and told him that provider would like for him to go to UC if he is still feeling bad. Pt verbalized understanding.

## 2018-10-17 NOTE — Telephone Encounter (Signed)
Due to schedule would advise urgent care today if still feeling bad.  Advise this for further work-up and labs.

## 2018-10-18 ENCOUNTER — Other Ambulatory Visit: Payer: Self-pay | Admitting: Nurse Practitioner

## 2018-10-25 ENCOUNTER — Other Ambulatory Visit: Payer: Self-pay

## 2018-10-25 ENCOUNTER — Ambulatory Visit: Payer: Commercial Managed Care - PPO | Admitting: Nurse Practitioner

## 2018-10-25 ENCOUNTER — Encounter: Payer: Self-pay | Admitting: Nurse Practitioner

## 2018-10-25 VITALS — BP 146/88 | HR 94 | Temp 98.3°F

## 2018-10-25 DIAGNOSIS — F32A Depression, unspecified: Secondary | ICD-10-CM

## 2018-10-25 DIAGNOSIS — F419 Anxiety disorder, unspecified: Secondary | ICD-10-CM | POA: Diagnosis not present

## 2018-10-25 DIAGNOSIS — E8881 Metabolic syndrome: Secondary | ICD-10-CM

## 2018-10-25 DIAGNOSIS — E782 Mixed hyperlipidemia: Secondary | ICD-10-CM

## 2018-10-25 DIAGNOSIS — F329 Major depressive disorder, single episode, unspecified: Secondary | ICD-10-CM

## 2018-10-25 DIAGNOSIS — F10282 Alcohol dependence with alcohol-induced sleep disorder: Secondary | ICD-10-CM

## 2018-10-25 DIAGNOSIS — I10 Essential (primary) hypertension: Secondary | ICD-10-CM

## 2018-10-25 MED ORDER — CLONAZEPAM 0.5 MG PO TABS: 0.5000 mg | ORAL_TABLET | Freq: Two times a day (BID) | ORAL | 2 refills | Status: DC | PRN

## 2018-10-25 MED ORDER — CLONAZEPAM 0.5 MG PO TABS: 0.5000 mg | ORAL_TABLET | Freq: Two times a day (BID) | ORAL | 0 refills | Status: DC | PRN

## 2018-10-25 MED ORDER — LISINOPRIL 40 MG PO TABS: 40.0000 mg | ORAL_TABLET | Freq: Every day | ORAL | 3 refills | Status: DC

## 2018-10-25 NOTE — Assessment & Plan Note (Signed)
Recommend continued reduction of alcohol intake, which he is currently working with his wife on.  Offered referral to psychiatry or rehab center, which he refuses at this time.

## 2018-10-25 NOTE — Assessment & Plan Note (Signed)
Chronic, ongoing with BP above goal.  Recommended continued reduction of alcohol intake + monitoring BP at home daily in morning.  Will increase Lisinopril to 40 MG daily and continue Metoprolol, script sent.  Referral to weight management to discuss safer alternatives to Phentermine for weight loss.  Labs today.  Return in 4 weeks.

## 2018-10-25 NOTE — Assessment & Plan Note (Signed)
Check lipid panel today 

## 2018-10-25 NOTE — Assessment & Plan Note (Signed)
Ongoing issue, with PHQ9 = 7 and GAD 14 today.  Denies SI/HI.  Currently exacerbated by loss of wife's job.  Had at length discussion with him that he is not to take Klonopin with alcohol. Will obtain UDS and have signed controlled substance agreement today.  He refuses trial of SSRI, mood stabilizer, or Seroquel today reporting these have increased agitation in past.  Discussed with him that Klonopin would be refilled today, but if requires longer than 6 months he will require psychiatry referral and assessment for further refills.  He verbalized understanding of this and agreed not to take benzo with alcohol.  Refuses rehab or psychiatry referral today.

## 2018-10-25 NOTE — Assessment & Plan Note (Addendum)
Referral to weight management to discuss safer alternatives to Phentermine for weight loss in patient with HTN and underlying mental health issues.

## 2018-10-25 NOTE — Patient Instructions (Signed)

## 2018-10-25 NOTE — Progress Notes (Signed)
BP (!) 146/88 (BP Location: Left Arm, Patient Position: Sitting)   Pulse 94 Comment: apical  Temp 98.3 F (36.8 C) (Oral)   SpO2 97%    Subjective:    Patient ID: Nathan Haws., male    DOB: 1985-06-24, 33 y.o.   MRN: [id number]  HPI: Nathan Melchert. is a 33 y.o. male  Chief Complaint  Patient presents with  . Anxiety   HYPERTENSION Continues on Metoprolol XL 25 MG QDAY and Lisinopril 20 MG QDAY.  Feels this is not working as well for him.  Hypertension status: stable  Satisfied with current treatment? yes Duration of hypertension: chronic BP monitoring frequency:  not checking BP range:  BP medication side effects:  no Medication compliance: good compliance Aspirin: no Recurrent headaches: no Visual changes: no Palpitations: no Dyspnea: no Chest pain: no Lower extremity edema: no Dizzy/lightheaded: no   Weight loss clinic  ANXIETY/STRESS Has diagnosis of Bipolar in chart, but reports this is not correct and [name] was not diagnosed with this by psychiatry in past.  Previously has taken Klonopin 0.5 MG BID as needed, on review of PMP last fill on this 12/13/17.  Reports previous provider alerted him not to take this while drinking, which [name] does not.  States [name] only takes during day if feeling anxious.  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  Does have history of alcohol abuse, at this time reports drinking a "shot a night on work night".  States his "wife is shutting my drinking down", she does not drink.   States [name] has taken SSRI or mood stabilizers + antipsychotic medication (like Seroquel) in past, reports these have not worked for him and caused him to have "rage episodes".   Duration:exacerbated Anxious mood: yes  Excessive worrying: yes Irritability: yes  Sweating: no Nausea: no Palpitations:no Hyperventilation: no Panic attacks: yes Agoraphobia: no   Obscessions/compulsions: no Depressed mood: no Depression screen [facility] 2/9 10/25/2018 10/21/2017 09/19/2017 03/21/2015 11/27/2014  Decreased Interest 0 0 0 0 0  Down, Depressed, Hopeless 1 0 0 0 0  PHQ - 2 Score 1 0 0 0 0  Altered sleeping 3 0 3 - -  Tired, decreased energy 1 0 1 - -  Change in appetite 1 0 1 - -  Feeling bad or failure about yourself  1 0 0 - -  Trouble concentrating 0 0 0 - -  Moving slowly or fidgety/restless 0 0 0 - -  Suicidal thoughts 0 0 0 - -  PHQ-9 Score 7 0 5 - -   Anhedonia: no Weight changes: no Insomnia: yes hard to fall asleep  Hypersomnia: no Fatigue/loss of energy: no Feelings of worthlessness: no Feelings of guilt: no Impaired concentration/indecisiveness: yes Suicidal ideations: no  Crying spells: no Recent Stressors/Life Changes: yes   Relationship problems: no   Family stress: yes     Financial stress: yes    Job stress: no    Recent death/loss: no GAD 7 : Generalized Anxiety Score 10/25/2018 10/21/2017  Nervous, Anxious, on Edge 2 1  Control/stop worrying 2 0  Worry too much - different things 2 0  Trouble relaxing 2 0  Restless 2 0  Easily annoyed or irritable 2 0  Afraid - awful might happen 2 0  Total GAD 7 Score 14 1   WEIGHT MANAGEMENT: Of note when checked PMP saw [name] is  also taking Phentermine with last fill 04/05/2018 for #30 pills, prescribed by Dr. Amalia Hailey .  [name] states provider is also an OB/GYN, but was working at a health clinic locally and prescribed to him, but this has now shut down and [name] inquired as to whether this provider could prescribe.  Discussed that this could not be prescribed in this clinic.  Offered referral to weight loss clinic, which [name] agrees with.  Discussed with him that it may be beneficial to look into alternate weight loss medication options due to his underlying anxiety and HTN.  [name] reports [name] feels phentermine "helped my anxiety because you can see I hardly used my Klonopin and the prescription has last me since  October last year".  Relevant past medical, surgical, family and social history reviewed and updated as indicated. Interim medical history since our last visit reviewed. Allergies and medications reviewed and updated.  Review of Systems  Constitutional: Negative for activity change, diaphoresis, fatigue and fever.  Respiratory: Negative for cough, chest tightness, shortness of breath and wheezing.   Cardiovascular: Negative for chest pain, palpitations and leg swelling.  Gastrointestinal: Negative for abdominal distention, abdominal pain, constipation, diarrhea, nausea and vomiting.  Neurological: Negative for dizziness, syncope, weakness, light-headedness, numbness and headaches.  Psychiatric/Behavioral: Positive for decreased concentration and sleep disturbance. Negative for self-injury and suicidal ideas. The patient is nervous/anxious.     Per HPI unless specifically indicated above     Objective:    BP (!) 146/88 (BP Location: Left Arm, Patient Position: Sitting)   Pulse 94 Comment: apical  Temp 98.3 F (36.8 C) (Oral)   SpO2 97%   Wt Readings from Last 3 Encounters:  11/11/17 236 lb 9.6 oz (107.3 kg)  10/21/17 233 lb (105.7 kg)  09/19/17 230 lb 6.4 oz (104.5 kg)    Physical Exam Vitals signs and nursing note reviewed.  Constitutional:      General: [name] is awake. [name] is not in acute distress.    Appearance: [name] is well-developed. [name] is obese. [name] is not ill-appearing.  HENT:     Head: Normocephalic and atraumatic.     Right Ear: Hearing normal. No drainage.     Left Ear: Hearing normal. No drainage.  Eyes:     General: Lids are normal.        Right eye: No discharge.        Left eye: No discharge.     Conjunctiva/sclera: Conjunctivae normal.     Pupils: Pupils are equal, round, and reactive to light.  Neck:     Musculoskeletal: Normal range of motion and neck supple.     Thyroid: No thyromegaly.     Vascular: No carotid bruit.  Cardiovascular:     Rate and Rhythm:  Normal rate and regular rhythm.     Heart sounds: Normal heart sounds, S1 normal and S2 normal. No murmur. No gallop.   Pulmonary:     Effort: Pulmonary effort is normal. No accessory muscle usage or respiratory distress.     Breath sounds: Normal breath sounds.  Abdominal:     General: Bowel sounds are normal.     Palpations: Abdomen is soft.  Musculoskeletal: Normal range of motion.     Right lower leg: No edema.     Left lower leg: No edema.  Skin:    General: Skin is warm and dry.  Neurological:     Mental Status: [name] is alert and oriented to person, place, and time.  Psychiatric:  Mood and Affect: Mood is anxious.        Behavior: Behavior normal.        Thought Content: Thought content normal.        Judgment: Judgment normal.     Results for orders placed or performed in visit on 09/19/17  CBC with Differential/Platelet  Result Value Ref Range   WBC 8.4 3.4 - 10.8 x10E3/uL   RBC 4.60 4.14 - 5.80 x10E6/uL   Hemoglobin 15.6 13.0 - 17.7 g/dL   Hematocrit 44.3 37.5 - 51.0 %   MCV 96 79 - 97 fL   MCH 33.9 (H) 26.6 - 33.0 pg   MCHC 35.2 31.5 - 35.7 g/dL   RDW 13.5 12.3 - 15.4 %   Platelets 183 150 - 450 x10E3/uL   Neutrophils 61 Not Estab. %   Lymphs 31 Not Estab. %   Monocytes 6 Not Estab. %   Eos 1 Not Estab. %   Basos 1 Not Estab. %   Neutrophils Absolute 5.2 1.4 - 7.0 x10E3/uL   Lymphocytes Absolute 2.6 0.7 - 3.1 x10E3/uL   Monocytes Absolute 0.5 0.1 - 0.9 x10E3/uL   EOS (ABSOLUTE) 0.1 0.0 - 0.4 x10E3/uL   Basophils Absolute 0.0 0.0 - 0.2 x10E3/uL   Immature Granulocytes 0 Not Estab. %   Immature Grans (Abs) 0.0 0.0 - 0.1 x10E3/uL  Comprehensive metabolic panel  Result Value Ref Range   Glucose 109 (H) 65 - 99 mg/dL   BUN 10 6 - 20 mg/dL   Creatinine, Ser 0.87 0.76 - 1.27 mg/dL   GFR calc non Af Amer 113 >59 mL/min/1.73   GFR calc Af Amer 131 >59 mL/min/1.73   BUN/Creatinine Ratio 11 9 - 20   Sodium 140 134 - 144 mmol/L   Potassium 4.4 3.5 - 5.2  mmol/L   Chloride 100 96 - 106 mmol/L   CO2 21 20 - 29 mmol/L   Calcium 9.6 8.7 - 10.2 mg/dL   Total Protein 6.8 6.0 - 8.5 g/dL   Albumin 4.5 3.5 - 5.5 g/dL   Globulin, Total 2.3 1.5 - 4.5 g/dL   Albumin/Globulin Ratio 2.0 1.2 - 2.2   Bilirubin Total 0.4 0.0 - 1.2 mg/dL   Alkaline Phosphatase 90 39 - 117 IU/L   AST 51 (H) 0 - 40 IU/L   ALT 52 (H) 0 - 44 IU/L  Lipid Panel w/o Chol/HDL Ratio  Result Value Ref Range   Cholesterol, Total 247 (H) 100 - 199 mg/dL   Triglycerides 581 (HH) 0 - 149 mg/dL   HDL 21 (L) >39 mg/dL   VLDL Cholesterol Cal Comment 5 - 40 mg/dL   LDL Calculated Comment 0 - 99 mg/dL      Assessment & Plan:   Problem List Items Addressed This Visit      Cardiovascular and Mediastinum   Hypertension    Chronic, ongoing with BP above goal.  Recommended continued reduction of alcohol intake + monitoring BP at home daily in morning.  Will increase Lisinopril to 40 MG daily and continue Metoprolol, script sent.  Referral to weight management to discuss safer alternatives to Phentermine for weight loss.  Labs today.  Return in 4 weeks.      Relevant Medications   lisinopril (ZESTRIL) 40 MG tablet   Other Relevant Orders   Comprehensive metabolic panel     Other   Mixed hyperlipidemia    Check lipid panel today.      Relevant Medications   lisinopril (ZESTRIL) 40 MG  tablet   Other Relevant Orders   Comprehensive metabolic panel   Lipid Panel w/o Chol/HDL Ratio   Anxiety and depression - Primary    Ongoing issue, with PHQ9 = 7 and GAD 14 today.  Denies SI/HI.  Currently exacerbated by loss of wife's job.  Had at length discussion with him that [name] is not to take Klonopin with alcohol. Will obtain UDS and have signed controlled substance agreement today.  [name] refuses trial of SSRI, mood stabilizer, or Seroquel today reporting these have increased agitation in past.  Discussed with him that Klonopin would be refilled today, but if requires longer than 6 months [name] will  require psychiatry referral and assessment for further refills.  [name] verbalized understanding of this and agreed not to take benzo with alcohol.  Refuses rehab or psychiatry referral today.        Relevant Orders   Urine drugs of abuse scrn w alc, routine (Ref Lab)   EtOH dependence ([place])    Recommend continued reduction of alcohol intake, which [name] is currently working with his wife on.  Offered referral to psychiatry or rehab center, which [name] refuses at this time.        Metabolic syndrome    Referral to weight management to discuss safer alternatives to Phentermine for weight loss in patient with HTN and underlying mental health issues.      Relevant Orders   Amb Ref to Medical Weight Management       Follow up plan: Return in about 4 weeks (around 11/22/2018) for HTN .

## 2018-10-26 ENCOUNTER — Telehealth: Payer: Self-pay | Admitting: Nurse Practitioner

## 2018-10-26 LAB — COMPREHENSIVE METABOLIC PANEL
ALT: 48 IU/L — ABNORMAL HIGH (ref 0–44)
AST: 35 IU/L (ref 0–40)
Albumin/Globulin Ratio: 1.8 (ref 1.2–2.2)
Albumin: 4.6 g/dL (ref 4.0–5.0)
Alkaline Phosphatase: 82 IU/L (ref 39–117)
BUN/Creatinine Ratio: 10 (ref 9–20)
BUN: 10 mg/dL (ref 6–20)
Bilirubin Total: 0.5 mg/dL (ref 0.0–1.2)
CO2: 23 mmol/L (ref 20–29)
Calcium: 9.4 mg/dL (ref 8.7–10.2)
Chloride: 98 mmol/L (ref 96–106)
Creatinine, Ser: 1.02 mg/dL (ref 0.76–1.27)
GFR calc Af Amer: 110 mL/min/{1.73_m2} (ref >59)
GFR calc non Af Amer: 95 mL/min/{1.73_m2} (ref >59)
Globulin, Total: 2.5 g/dL (ref 1.5–4.5)
Glucose: 102 mg/dL — ABNORMAL HIGH (ref 65–99)
Potassium: 4.2 mmol/L (ref 3.5–5.2)
Sodium: 140 mmol/L (ref 134–144)
Total Protein: 7.1 g/dL (ref 6.0–8.5)

## 2018-10-26 LAB — LIPID PANEL W/O CHOL/HDL RATIO
Cholesterol, Total: 281 mg/dL — ABNORMAL HIGH (ref 100–199)
HDL: 31 mg/dL — ABNORMAL LOW (ref >39)
Triglycerides: 883 mg/dL (ref 0–149)

## 2018-10-26 MED ORDER — GEMFIBROZIL 600 MG PO TABS: 600.0000 mg | ORAL_TABLET | Freq: Two times a day (BID) | ORAL | 3 refills | Status: DC

## 2018-10-26 NOTE — Telephone Encounter (Signed)
Called patient, notified of medication and appointment scheduled in 4 weeks.

## 2018-10-26 NOTE — Telephone Encounter (Signed)
Spoke to [name] on phone to review lab results.   Praised him as liver function tests are coming down, this is good, the lowest in 3 years with his reduction in alcohol intake.  Recommend continuing to reduce the amount of alcohol he drinks to avoid chronic damage to the liver.  Of concern is his triglyceride number, 883.  In past he has been on medication for this and discussed need to restart.  Will order Lopid, which he tolerated in past.  Discussed with him need to continue to reduce alcohol intake and high sugar foods as he is at high risk of pancreatitis, which would lead to hospital admission.  Sent script to pharmacy and will recheck levels at his next visit in 4 weeks.

## 2018-10-27 LAB — URINE DRUGS OF ABUSE SCREEN W ALC, ROUTINE (REF LAB)
Amphetamines, Urine: NEGATIVE ng/mL
Barbiturate Quant, Ur: NEGATIVE ng/mL
Benzodiazepine Quant, Ur: NEGATIVE ng/mL
Cannabinoid Quant, Ur: NEGATIVE ng/mL
Cocaine (Metab.): NEGATIVE ng/mL
Methadone Screen, Urine: NEGATIVE ng/mL
Opiate Quant, Ur: NEGATIVE ng/mL
PCP Quant, Ur: NEGATIVE ng/mL
Propoxyphene: NEGATIVE ng/mL

## 2018-10-27 LAB — ETHANOL CONFIRM, URINE
Ethanol, Ur - Confirmation: 0.104 %
Ethanol, Urine: POSITIVE — AB

## 2018-11-29 ENCOUNTER — Other Ambulatory Visit: Payer: Self-pay

## 2018-11-29 ENCOUNTER — Encounter: Payer: Self-pay | Admitting: Nurse Practitioner

## 2018-11-29 ENCOUNTER — Ambulatory Visit: Payer: Commercial Managed Care - PPO | Admitting: Nurse Practitioner

## 2018-11-29 VITALS — BP 138/90 | HR 83 | Temp 98.3°F | Ht 65.0 in | Wt 218.0 lb

## 2018-11-29 DIAGNOSIS — F10282 Alcohol dependence with alcohol-induced sleep disorder: Secondary | ICD-10-CM | POA: Diagnosis not present

## 2018-11-29 DIAGNOSIS — F329 Major depressive disorder, single episode, unspecified: Secondary | ICD-10-CM | POA: Diagnosis not present

## 2018-11-29 DIAGNOSIS — F32A Depression, unspecified: Secondary | ICD-10-CM

## 2018-11-29 DIAGNOSIS — I10 Essential (primary) hypertension: Secondary | ICD-10-CM

## 2018-11-29 DIAGNOSIS — F419 Anxiety disorder, unspecified: Secondary | ICD-10-CM

## 2018-11-29 NOTE — Assessment & Plan Note (Signed)
Recommend continued reduction of alcohol intake, which he is currently working with his wife on.  Offered referral to psychiatry or rehab center, which he refuses at this time.  Requesting psychiatric evaluation and discussing alcohol cessation is big step for patient, praised for this.

## 2018-11-29 NOTE — Assessment & Plan Note (Addendum)
Urgent referral to psychiatry placed. Denies SI/HI.  He has not taken Klonopin and is now interested in further psychiatric evaluation and maintenance medication he can take daily + alcohol cessation (although he refuses therapy, medication, or rehab for this element).  Requesting psychiatric evaluation and discussing alcohol cessation is big step for patient, praised for this.

## 2018-11-29 NOTE — Assessment & Plan Note (Signed)
Chronic, ongoing with initial BP elevated but repeat improved.  Will continue current medication regimen and place urgent referral to psychiatry.  If we can get anxiety and alcohol use under control this would benefit BP.  Return in 2 months.

## 2018-11-29 NOTE — Progress Notes (Signed)
BP 138/90 (BP Location: Left Arm, Patient Position: Sitting)   Pulse 83   Temp 98.3 F (36.8 C) (Oral)   Ht 5\' 5"  (1.651 m)   Wt 218 lb (98.9 kg)   SpO2 98%   BMI 36.28 kg/m    Subjective:    Patient ID: Nathan Haws., male    DOB: [birth year], 33 y.o.   MRN: [id number]  HPI: [name]. is a 33 y.o. male  Chief Complaint  Patient presents with  . Hypertension  . Anxiety   HYPERTENSION Continues on Metoprolol XL 25 MG QDAY and Lisinopril 40 MG QDAY (increased last visit).  Has not been checking BP at home as he continues with anxiety and knows it will be high often. Hypertension status: stable  Satisfied with current treatment? yes Duration of hypertension: chronic BP monitoring frequency:  not checking BP range:  BP medication side effects:  no Medication compliance: good compliance Aspirin: no Recurrent headaches: no Visual changes: no Palpitations: no Dyspnea: no Chest pain: no Lower extremity edema: no Dizzy/lightheaded: no   ANXIETY/STRESS Has diagnosis of Bipolar in chart, but reports this is not correct and he was not diagnosed with this by psychiatry in past.  Previously has taken Klonopin 0.5 MG BID as needed, on review of PMP last fill on this 12/13/17.  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  Does have history of alcohol abuse.  Mom is alcoholic and dad's side had members who were as well.    He refused trial of SSRI, mood stabilizer, or Seroquel last visit  reporting these have increased agitation in past.  Refill of Klonopin provided last visit, but was made aware if he requires longer than 6 months he will require psychiatry referral and assessment for further refills. Today he reports he has not taken any of the Klonopin, he reached point after our discussion and decided he would like a psychiatry referral.  Has been using Valor essential oil at  home, which he reports has helped some.  At this time he is interested in quitting drinking, does not want therapy/rehab/or medications, but wishes to see psychiatry and further work on his mental health first and he has been working on cutting back drinking on his own.   Duration:uncontrolled Anxious mood: yes  Excessive worrying: yes Irritability: yes  Sweating: no Nausea: no Palpitations:no Hyperventilation: no Panic attacks: yes Agoraphobia: no  Obscessions/compulsions: no Depressed mood: no Depression screen [facility] 2/9 10/25/2018 10/21/2017 09/19/2017 03/21/2015 11/27/2014  Decreased Interest 0 0 0 0 0  Down, Depressed, Hopeless 1 0 0 0 0  PHQ - 2 Score 1 0 0 0 0  Altered sleeping 3 0 3 - -  Tired, decreased energy 1 0 1 - -  Change in appetite 1 0 1 - -  Feeling bad or failure about yourself  1 0 0 - -  Trouble concentrating 0 0 0 - -  Moving slowly or fidgety/restless 0 0 0 - -  Suicidal thoughts 0 0 0 - -  PHQ-9 Score 7 0 5 - -   Anhedonia: no Weight changes: no Insomnia: yes hard to stay asleep  Hypersomnia: no Fatigue/loss of energy: no Feelings of worthlessness: no Feelings of guilt: no Impaired concentration/indecisiveness: yes Suicidal ideations: no  Crying spells: no Recent Stressors/Life Changes: yes   Relationship problems: no   Family stress: no  Financial stress: yes    Job stress: no    Recent death/loss: no  Relevant past medical, surgical, family and social history reviewed and updated as indicated. Interim medical history since our last visit reviewed. Allergies and medications reviewed and updated.  Review of Systems  Constitutional: Negative for activity change, diaphoresis, fatigue and fever.  Respiratory: Negative for cough, chest tightness, shortness of breath and wheezing.   Cardiovascular: Negative for chest pain, palpitations and leg swelling.  Gastrointestinal: Negative for abdominal distention, abdominal pain, constipation, diarrhea, nausea  and vomiting.  Neurological: Negative for dizziness, syncope, weakness, light-headedness, numbness and headaches.  Psychiatric/Behavioral: Positive for decreased concentration and sleep disturbance. Negative for self-injury and suicidal ideas. The patient is nervous/anxious.     Per HPI unless specifically indicated above     Objective:    BP 138/90 (BP Location: Left Arm, Patient Position: Sitting)   Pulse 83   Temp 98.3 F (36.8 C) (Oral)   Ht 5\' 5"  (1.651 m)   Wt 218 lb (98.9 kg)   SpO2 98%   BMI 36.28 kg/m   Wt Readings from Last 3 Encounters:  11/29/18 218 lb (98.9 kg)  11/11/17 236 lb 9.6 oz (107.3 kg)  10/21/17 233 lb (105.7 kg)    Physical Exam Vitals signs and nursing note reviewed.  Constitutional:      General: He is awake. He is not in acute distress.    Appearance: He is well-developed. He is obese. He is not ill-appearing.  HENT:     Head: Normocephalic and atraumatic.     Right Ear: Hearing normal. No drainage.     Left Ear: Hearing normal. No drainage.  Eyes:     General: Lids are normal.        Right eye: No discharge.        Left eye: No discharge.     Conjunctiva/sclera: Conjunctivae normal.     Pupils: Pupils are equal, round, and reactive to light.  Neck:     Musculoskeletal: Normal range of motion and neck supple.     Thyroid: No thyromegaly.     Vascular: No carotid bruit.  Cardiovascular:     Rate and Rhythm: Normal rate and regular rhythm.     Heart sounds: Normal heart sounds, S1 normal and S2 normal. No murmur. No gallop.   Pulmonary:     Effort: Pulmonary effort is normal. No accessory muscle usage or respiratory distress.     Breath sounds: Normal breath sounds.  Abdominal:     General: Bowel sounds are normal.     Palpations: Abdomen is soft.  Musculoskeletal: Normal range of motion.     Right lower leg: No edema.     Left lower leg: No edema.  Skin:    General: Skin is warm and dry.  Neurological:     Mental Status: He is alert  and oriented to person, place, and time.  Psychiatric:        Mood and Affect: Mood is anxious.        Behavior: Behavior normal.        Thought Content: Thought content normal.        Judgment: Judgment normal.     Results for orders placed or performed in visit on 10/25/18  Urine drugs of abuse scrn w alc, routine (Ref Lab)  Result Value Ref Range   Amphetamines, Urine Negative Cutoff=1000 ng/mL   Barbiturate Quant, Ur Negative Cutoff=300 ng/mL   Benzodiazepine Quant, Ur Negative Cutoff=300 ng/mL  Cannabinoid Quant, Ur Negative Cutoff=50 ng/mL   Cocaine (Metab.) Negative Cutoff=300 ng/mL   Opiate Quant, Ur Negative Cutoff=300 ng/mL   PCP Quant, Ur Negative Cutoff=25 ng/mL   Methadone Screen, Urine Negative Cutoff=300 ng/mL   Propoxyphene Negative Cutoff=300 ng/mL   Ethanol, Urine See Final Results Cutoff=0.020 %  Comprehensive metabolic panel  Result Value Ref Range   Glucose 102 (H) 65 - 99 mg/dL   BUN 10 6 - 20 mg/dL   Creatinine, Ser 1.02 0.76 - 1.27 mg/dL   GFR calc non Af Amer 95 >59 mL/min/1.73   GFR calc Af Amer 110 >59 mL/min/1.73   BUN/Creatinine Ratio 10 9 - 20   Sodium 140 134 - 144 mmol/L   Potassium 4.2 3.5 - 5.2 mmol/L   Chloride 98 96 - 106 mmol/L   CO2 23 20 - 29 mmol/L   Calcium 9.4 8.7 - 10.2 mg/dL   Total Protein 7.1 6.0 - 8.5 g/dL   Albumin 4.6 4.0 - 5.0 g/dL   Globulin, Total 2.5 1.5 - 4.5 g/dL   Albumin/Globulin Ratio 1.8 1.2 - 2.2   Bilirubin Total 0.5 0.0 - 1.2 mg/dL   Alkaline Phosphatase 82 39 - 117 IU/L   AST 35 0 - 40 IU/L   ALT 48 (H) 0 - 44 IU/L  Lipid Panel w/o Chol/HDL Ratio  Result Value Ref Range   Cholesterol, Total 281 (H) 100 - 199 mg/dL   Triglycerides 883 (HH) 0 - 149 mg/dL   HDL 31 (L) >39 mg/dL   VLDL Cholesterol Cal Comment 5 - 40 mg/dL   LDL Calculated Comment 0 - 99 mg/dL  Ethanol Confirm, Urine  Result Value Ref Range   Ethanol, Urine Positive (A) Cutoff=0.020   Ethanol, Ur - Confirmation 0.104 Cutoff=0.020 %       Assessment & Plan:   Problem List Items Addressed This Visit      Cardiovascular and Mediastinum   Hypertension    Chronic, ongoing with initial BP elevated but repeat improved.  Will continue current medication regimen and place urgent referral to psychiatry.  If we can get anxiety and alcohol use under control this would benefit BP.  Return in 2 months.        Other   Anxiety and depression    Urgent referral to psychiatry placed. He has not taken Klonopin and is now interested in further psychiatric evaluation and maintenance medication he can take daily + alcohol cessation (although he refuses therapy, medication, or rehab for this element).  Requesting psychiatric evaluation and discussing alcohol cessation is big step for patient, praised for this.        Relevant Orders   Ambulatory referral to Psychiatry   EtOH dependence ([place]) - Primary    Recommend continued reduction of alcohol intake, which he is currently working with his wife on.  Offered referral to psychiatry or rehab center, which he refuses at this time.  Requesting psychiatric evaluation and discussing alcohol cessation is big step for patient, praised for this.            Follow up plan: Return in about 2 months (around 01/29/2019) for HTN and Mood.

## 2018-11-29 NOTE — Patient Instructions (Signed)

## 2018-12-05 ENCOUNTER — Ambulatory Visit: Payer: Self-pay | Admitting: Nurse Practitioner

## 2018-12-21 ENCOUNTER — Other Ambulatory Visit: Payer: Self-pay | Admitting: Nurse Practitioner

## 2018-12-21 NOTE — Telephone Encounter (Signed)
Forwarding medication refill request to PCP for review. 

## 2019-01-15 ENCOUNTER — Ambulatory Visit: Payer: Self-pay | Admitting: Nurse Practitioner

## 2019-02-05 ENCOUNTER — Ambulatory Visit (INDEPENDENT_AMBULATORY_CARE_PROVIDER_SITE_OTHER): Payer: Commercial Managed Care - PPO | Admitting: Nurse Practitioner

## 2019-02-05 ENCOUNTER — Encounter: Payer: Self-pay | Admitting: Nurse Practitioner

## 2019-02-05 ENCOUNTER — Ambulatory Visit (INDEPENDENT_AMBULATORY_CARE_PROVIDER_SITE_OTHER): Payer: Commercial Managed Care - PPO | Admitting: Licensed Clinical Social Worker

## 2019-02-05 ENCOUNTER — Other Ambulatory Visit: Payer: Self-pay

## 2019-02-05 DIAGNOSIS — F1020 Alcohol dependence, uncomplicated: Secondary | ICD-10-CM

## 2019-02-05 DIAGNOSIS — F10282 Alcohol dependence with alcohol-induced sleep disorder: Secondary | ICD-10-CM

## 2019-02-05 DIAGNOSIS — F319 Bipolar disorder, unspecified: Secondary | ICD-10-CM

## 2019-02-05 DIAGNOSIS — F329 Major depressive disorder, single episode, unspecified: Secondary | ICD-10-CM | POA: Diagnosis not present

## 2019-02-05 DIAGNOSIS — F419 Anxiety disorder, unspecified: Secondary | ICD-10-CM

## 2019-02-05 DIAGNOSIS — F32A Depression, unspecified: Secondary | ICD-10-CM

## 2019-02-05 NOTE — Patient Instructions (Signed)
Alcohol Abuse and Dependence Information, Adult Alcohol is a widely available drug. People drink alcohol in different amounts. People who drink alcohol very often and in large amounts often have problems during and after drinking. They may develop what is called an alcohol use disorder. There are two main types of alcohol use disorders:  Alcohol abuse. This is when you use alcohol too much or too often. You may use alcohol to make yourself feel happy or to reduce stress. You may have a hard time setting a limit on the amount you drink.  Alcohol dependence. This is when you use alcohol consistently for a period of time, and your body changes as a result. This can make it hard to stop drinking because you may start to feel sick or feel different when you do not use alcohol. These symptoms are known as withdrawal. How can alcohol abuse and dependence affect me? Alcohol abuse and dependence can have a negative effect on your life. Drinking too much can lead to addiction. You may feel like you need alcohol to function normally. You may drink alcohol before work in the morning, during the day, or as soon as you get home from work in the evening. These actions can result in:  Poor work performance.  Job loss.  Financial problems.  Car crashes or criminal charges from driving after drinking alcohol.  Problems in your relationships with friends and family.  Losing the trust and respect of coworkers, friends, and family. Drinking heavily over a long period of time can permanently damage your body and brain, and can cause lifelong health issues, such as:  Damage to your liver or pancreas.  Heart problems, high blood pressure, or stroke.  Certain cancers.  Decreased ability to fight infections.  Brain or nerve damage.  Depression.  Early (premature) death. If you are careless or you crave alcohol, it is easy to drink more than your body can handle (overdose). Alcohol overdose is a serious  situation that requires hospitalization. It may lead to permanent injuries or death. What can increase my risk?  Having a family history of alcohol abuse.  Having depression or other mental health conditions.  Beginning to drink at an early age.  Binge drinking often.  Experiencing trauma, stress, and an unstable home life during childhood.  Spending time with people who drink often. What actions can I take to prevent or manage alcohol abuse and dependence?  Do not drink alcohol if: ? Your health care provider tells you not to drink. ? You are pregnant, may be pregnant, or are planning to become pregnant.  If you drink alcohol: ? Limit how much you use to:  0-1 drink a day for women.  0-2 drinks a day for men. ? Be aware of how much alcohol is in your drink. In the [place] one drink equals one 12 oz bottle of beer (355 mL), one 5 oz glass of wine (148 mL), or one 1 oz glass of hard liquor (44 mL).  Stop drinking if you have been drinking too much. This can be very hard to do if you are used to abusing alcohol. If you begin to have withdrawal symptoms, talk with your health care provider or a person that you trust. These symptoms may include anxiety, shaky hands, headache, nausea, sweating, or not being able to sleep.  Choose to drink nonalcoholic beverages in social gatherings and places where there may be alcohol. Activity  Spend more time on activities that you enjoy that do   not involve alcohol, like hobbies or exercise.  Find healthy ways to cope with stress, such as exercise, meditation, or spending time with people you care about. General information  Talk to your family, coworkers, and friends about supporting you in your efforts to stop drinking. If they drink, ask them not to drink around you. Spend more time with people who do not drink alcohol.  If you think that you have an alcohol dependency problem: ? Tell friends or family about your concerns. ? Talk with your  health care provider or another health professional about where to get help. ? Work with a therapist and a chemical dependency counselor. ? Consider joining a support group for people who struggle with alcohol abuse and dependence. Where to find support   Your health care provider.  SMART Recovery: www.smartrecovery.org Therapy and support groups  Local treatment centers or chemical dependency counselors.  Local AA groups in your community: www.aa.org Where to find more information  Centers for Disease Control and Prevention: www.cdc.gov  National Institute on Alcohol Abuse and Alcoholism: www.niaaa.nih.gov  Alcoholics Anonymous (AA): www.aa.org Contact a health care provider if:  You drank more or for longer than you intended on more than one occasion.  You tried to stop drinking or to cut back on how much you drink, but you were not able to.  You often drink to the point of vomiting or passing out.  You want to drink so badly that you cannot think about anything else.  You have problems in your life due to drinking, but you continue to drink.  You keep drinking even though you feel anxious, depressed, or have experienced memory loss.  You have stopped doing the things you used to enjoy in order to drink.  You have to drink more than you used to in order to get the effect you want.  You experience anxiety, sweating, nausea, shakiness, and trouble sleeping when you try to stop drinking. Get help right away if:  You have thoughts about hurting yourself or others.  You have serious withdrawal symptoms, including: ? Confusion. ? Racing heart. ? High blood pressure. ? Fever. If you ever feel like you may hurt yourself or others, or have thoughts about taking your own life, get help right away. You can go to your nearest emergency department or call:  Your local emergency services (911 in the [place]  A suicide crisis helpline, such as the National Suicide Prevention  Lifeline at 1-800-273-8255. This is open 24 hours a day. Summary  Alcohol abuse and dependence can have a negative effect on your life. Drinking too much or too often can lead to addiction.  If you drink alcohol, limit how much you use.  If you are having trouble keeping your drinking under control, find ways to change your behavior. Hobbies, calming activities, exercise, or support groups can help.  If you feel you need help with changing your drinking habits, talk with your health care provider, a good friend, or a therapist, or go to an AA group. This information is not intended to replace advice given to you by your health care provider. Make sure you discuss any questions you have with your health care provider. Document Released: 02/10/2016 Document Revised: 06/06/2018 Document Reviewed: 04/25/2018 Elsevier Patient Education  2020 Elsevier Inc.  

## 2019-02-05 NOTE — Assessment & Plan Note (Signed)
Praised patient for recognizing his dependence and being at the change stage.  This is big step for him and first time he has introduced this topic to provider.  Have recommended he reach out to his psychiatrist and alert them Gabapentin not helping and inquire into Naltrexone, if agreeable can either obtain script from psychiatry or PCP.  He agrees to referral to CCM SW, will place this and work on attempting to find local resources available to start journey towards cessation.  Return in 3 weeks.

## 2019-02-05 NOTE — Chronic Care Management (AMB) (Signed)
Chronic Care Management    Clinical Social Work Follow Up Note  02/05/2019 Name: [name]. MRN: [id number] DOB: 11/12/1985  Nathan Haws. is a 33 y.o. year old male who is a primary care patient of Cannady, Barbaraann Faster, NP. The CCM team was consulted for assistance with Mental Health Counseling and Resources.   Review of patient status, including review of consultants reports, other relevant assessments, and collaboration with appropriate care team members and the patient's provider was performed as part of comprehensive patient evaluation and provision of chronic care management services.    SDOH (Social Determinants of Health) screening performed today: Depression   Stress. See Care Plan for related entries.   Advanced Directives Status: <no information> See Care Plan for related entries.   Outpatient Encounter Medications as of 02/05/2019  Medication Sig  . allopurinol (ZYLOPRIM) 300 MG tablet TAKE 1 TABLET BY MOUTH EVERY DAY  . clonazePAM (KLONOPIN) 0.5 MG tablet Take 1 tablet (0.5 mg total) by mouth 2 (two) times daily as needed for anxiety.  . colchicine 0.6 MG tablet Take 0.6 mg by mouth daily as needed.   Marland Kitchen esomeprazole (NEXIUM) 40 MG capsule Take 1 capsule (40 mg total) by mouth daily at 12 noon.  . gabapentin (NEURONTIN) 300 MG capsule Take 300 mg by mouth 2 (two) times daily.  Marland Kitchen gemfibrozil (LOPID) 600 MG tablet Take 1 tablet (600 mg total) by mouth 2 (two) times daily before a meal. (Patient not taking: Reported on 02/05/2019)  . lisinopril (ZESTRIL) 40 MG tablet Take 1 tablet (40 mg total) by mouth daily.  . metoprolol succinate (TOPROL-XL) 25 MG 24 hr tablet TAKE 1 TABLET BY MOUTH EVERY DAY   No facility-administered encounter medications on file as of 02/05/2019.      Goals Addressed    . "[name] is wanting to stop drinking and work on his stress management/anxiety." (pt-stated)       Current Barriers:  . Chronic Mental Health needs related to Anxiety, Stress  Management and Alcoholism  . Limited social support . Mental Health Concerns  . Limited education about Anxiety Management* . Lacks knowledge of community resource: Available SA resources within the area and local AA support groups . Suicidal Ideation/Homicidal Ideation: No  Clinical Social Work Goal(s):  Marland Kitchen Over the next 120 days, patient will work with SW  bi-monthly  by telephone or in person to reduce or manage symptoms related to decreasing drinking and improving mental health  . Over the next 120 days, patient will demonstrate improved health management independence as evidenced by attending AA meetings   Interventions: . Patient interviewed and appropriate assessments performed: brief mental health assessment . Patient interviewed and appropriate assessments performed . Discussed plans with patient for ongoing care management follow up and provided patient with direct contact information for care management team . LCSW provided education on Anxiety Management, Stress Management, Crisis Support and Substance Abuse resources . Advised patient to check their email and print resources. Spouse agreeable to hand deliver them to patient once he gets home from work . Assisted patient/caregiver with obtaining information about health plan benefits . Referred patient to [facility] (mental health provider) for long term follow up and therapy/counseling . Solution-Focused Strategies provided during session . Patient recently changed his gabapentin medication that was prescribed at [facility] but had adverse side effects. Patient is interested in Naltrexone  Patient Self Care Activities:  . Ability for insight . Motivation for treatment . Strong family or  social support  Patient Coping Strengths:  . Hopefulness . Self Advocate  Patient Self Care Deficits:  . Lacks social connections  Initial goal documentation     Follow Up Plan: SW will follow up with patient by phone over  the next 2 weeks  Eula Fried, [place], MSW, [email or web address] Phone: [phone]
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
## 2019-02-16 ENCOUNTER — Ambulatory Visit: Payer: Self-pay

## 2019-02-18 NOTE — Chronic Care Management (AMB) (Signed)
  Care Management   Follow Up Note   02/18/2019 Name: [name]. MRN: [id number] DOB: 25-Mar-1985  Referred by: Venita Lick, NP Reason for referral : Care Coordination   [name] is a 33 y.o. year old male who is a primary care patient of Cannady, Barbaraann Faster, NP. The care management team was consulted for assistance with care management and care coordination needs.    Review of patient status, including review of consultants reports, relevant laboratory and other test results, and collaboration with appropriate care team members and the patient's provider was performed as part of comprehensive patient evaluation and provision of chronic care management services.    LCSW completed CCM outreach attempt today but was unable to reach patient successfully. A HIPPA compliant voice message was left encouraging patient to return call once available. LCSW rescheduled CCM SW appointment as well.  A HIPPA compliant phone message was left for the patient providing contact information and requesting a return call.   Eula Fried, BSW, MSW, [facility] [email or web address] Phone: [phone]

## 2019-02-26 ENCOUNTER — Ambulatory Visit (INDEPENDENT_AMBULATORY_CARE_PROVIDER_SITE_OTHER): Payer: Commercial Managed Care - PPO | Admitting: Nurse Practitioner

## 2019-02-26 ENCOUNTER — Other Ambulatory Visit: Payer: Self-pay

## 2019-02-26 ENCOUNTER — Encounter: Payer: Self-pay | Admitting: Nurse Practitioner

## 2019-02-26 DIAGNOSIS — R7401 Elevation of levels of liver transaminase levels: Secondary | ICD-10-CM | POA: Diagnosis not present

## 2019-02-26 DIAGNOSIS — Z20822 Contact with and (suspected) exposure to covid-19: Secondary | ICD-10-CM | POA: Insufficient documentation

## 2019-02-26 DIAGNOSIS — Z20828 Contact with and (suspected) exposure to other viral communicable diseases: Secondary | ICD-10-CM | POA: Diagnosis not present

## 2019-02-26 DIAGNOSIS — F319 Bipolar disorder, unspecified: Secondary | ICD-10-CM | POA: Diagnosis not present

## 2019-02-26 DIAGNOSIS — F10282 Alcohol dependence with alcohol-induced sleep disorder: Secondary | ICD-10-CM

## 2019-02-26 MED ORDER — ALBUTEROL SULFATE HFA 108 (90 BASE) MCG/ACT IN AERS: 2.0000 | INHALATION_SPRAY | Freq: Four times a day (QID) | RESPIRATORY_TRACT | 3 refills | Status: DC | PRN

## 2019-02-26 MED ORDER — BENZONATATE 100 MG PO CAPS: 200.0000 mg | ORAL_CAPSULE | Freq: Three times a day (TID) | ORAL | 0 refills | Status: DC | PRN

## 2019-02-26 MED ORDER — GUAIFENESIN ER 600 MG PO TB12: 600.0000 mg | ORAL_TABLET | Freq: Two times a day (BID) | ORAL | 0 refills | Status: DC | PRN

## 2019-02-26 NOTE — Patient Instructions (Signed)

## 2019-02-26 NOTE — Progress Notes (Signed)
There were no vitals taken for this visit.   Subjective:    Patient ID: Nathan Haws., male    DOB: 03/04/1985, 33 y.o.   MRN: [id number]  HPI: Nathan Lattanzi. is a 33 y.o. male  Chief Complaint  Patient presents with  . Alcohol Problem    pt states he quit drinking  . Depression  . COVID    pt states his wife tested positive for COVID over the weekend, states his test was negative    . This visit was completed via telephone due to the restrictions of the COVID-19 pandemic. All issues as above were discussed and addressed but no physical exam was performed. If it was felt that the patient should be evaluated in the office, they were directed there. The patient verbally consented to this visit. Patient was unable to complete an audio/visual visit due to Technical difficulties,Lack of internet. Due to the catastrophic nature of the COVID-19 pandemic, this visit was done through audio contact only. . Location of the patient: home . Location of the provider: work . Those involved with this call:  . Provider: Marnee Guarneri, DNP . CMA: Yvonna Alanis, CMA . Front Desk/Registration: Jill Side  . Time spent on call: 15 minutes on the phone discussing health concerns. 10 minutes total spent in review of patient's record and preparation of their chart.  . I verified patient identity using two factors (patient name and date of birth). Patient consents verbally to being seen via telemedicine visit today.    ALCOHOL ABUSE: Reports he quit drinking, Wednesday night was last drink.  He called treatment centers and they are discussing how they could afford it.  Reports that he has been tolerating without withdrawal symptoms.  Has been 4 days without alcohol use and denies cravings/urges, not looking or wanting alcohol.  Is going to [place] groups and has a support person.    DEPRESSION Reports his mood "has been fine".  Takes Klonopin minimally, took one over 4 days ago, but rarely  uses. Mood status: stable Satisfied with current treatment?: yes Symptom severity: mild  Duration of current treatment : chronic Side effects: no Medication compliance: good compliance Psychotherapy/counseling: none Previous psychiatric medications: Klonopin Depressed mood: no Anxious mood: yes Anhedonia: no Significant weight loss or gain: no Insomnia: yes hard to fall asleep Fatigue: no Feelings of worthlessness or guilt: no Impaired concentration/indecisiveness: no Suicidal ideations: no Hopelessness: no Crying spells: no Depression screen [facility] 2/9 02/26/2019 02/05/2019 10/25/2018 10/21/2017 09/19/2017  Decreased Interest 0 0 0 0 0  Down, Depressed, Hopeless 0 0 1 0 0  PHQ - 2 Score 0 0 1 0 0  Altered sleeping 1 3 3  0 3  Tired, decreased energy 1 0 1 0 1  Change in appetite 1 0 1 0 1  Feeling bad or failure about yourself  0 0 1 0 0  Trouble concentrating 0 0 0 0 0  Moving slowly or fidgety/restless 0 0 0 0 0  Suicidal thoughts 0 0 0 0 0  PHQ-9 Score 3 3 7  0 5  Difficult doing work/chores Not difficult at all Not difficult at all - - -   COVID EXPOSURE He reports he started coughing today.  His wife tested positive yesterday.  Reports he tested yesterday and was negative.  His father was in house recently and he tested positive.  He has no loss of taste of smell or taste.  Both were tested at [facility] urgent care.  Fever: no Cough: yes Shortness of breath: occasional Wheezing: no Chest pain: no Chest tightness: no Chest congestion: no Nasal congestion: yes Runny nose: yes Post nasal drip: yes Sneezing: no Sore throat: no Swollen glands: no Sinus pressure: no Headache: no Face pain: no Toothache: no Ear pain: none Ear pressure: none Eyes red/itching:no Eye drainage/crusting: no  Vomiting: no Rash: no Fatigue: yes  Relevant past medical, surgical, family and social history reviewed and updated as indicated. Interim medical history since our last visit  reviewed. Allergies and medications reviewed and updated.  Review of Systems  Constitutional: Negative for activity change, diaphoresis, fatigue and fever.  HENT: Positive for congestion, postnasal drip and rhinorrhea. Negative for sinus pressure, sinus pain and sore throat.   Respiratory: Positive for cough and shortness of breath. Negative for chest tightness and wheezing.   Cardiovascular: Negative for chest pain, palpitations and leg swelling.  Gastrointestinal: Negative.   Neurological: Negative for dizziness, syncope, weakness, light-headedness, numbness and headaches.  Psychiatric/Behavioral: Negative for decreased concentration, self-injury, sleep disturbance and suicidal ideas. The patient is nervous/anxious.     Per HPI unless specifically indicated above     Objective:    There were no vitals taken for this visit.  Wt Readings from Last 3 Encounters:  11/29/18 218 lb (98.9 kg)  11/11/17 236 lb 9.6 oz (107.3 kg)  10/21/17 233 lb (105.7 kg)    Physical Exam   Unable to perform due to telephone visit only.  Results for orders placed or performed in visit on 10/25/18  Urine drugs of abuse scrn w alc, routine (Ref Lab)  Result Value Ref Range   Amphetamines, Urine Negative Cutoff=1000 ng/mL   Barbiturate Quant, Ur Negative Cutoff=300 ng/mL   Benzodiazepine Quant, Ur Negative Cutoff=300 ng/mL   Cannabinoid Quant, Ur Negative Cutoff=50 ng/mL   Cocaine (Metab.) Negative Cutoff=300 ng/mL   Opiate Quant, Ur Negative Cutoff=300 ng/mL   PCP Quant, Ur Negative Cutoff=25 ng/mL   Methadone Screen, Urine Negative Cutoff=300 ng/mL   Propoxyphene Negative Cutoff=300 ng/mL   Ethanol, Urine See Final Results Cutoff=0.020 %  Comprehensive metabolic panel  Result Value Ref Range   Glucose 102 (H) 65 - 99 mg/dL   BUN 10 6 - 20 mg/dL   Creatinine, Ser 1.02 0.76 - 1.27 mg/dL   GFR calc non Af Amer 95 >59 mL/min/1.73   GFR calc Af Amer 110 >59 mL/min/1.73   BUN/Creatinine Ratio 10 9  - 20   Sodium 140 134 - 144 mmol/L   Potassium 4.2 3.5 - 5.2 mmol/L   Chloride 98 96 - 106 mmol/L   CO2 23 20 - 29 mmol/L   Calcium 9.4 8.7 - 10.2 mg/dL   Total Protein 7.1 6.0 - 8.5 g/dL   Albumin 4.6 4.0 - 5.0 g/dL   Globulin, Total 2.5 1.5 - 4.5 g/dL   Albumin/Globulin Ratio 1.8 1.2 - 2.2   Bilirubin Total 0.5 0.0 - 1.2 mg/dL   Alkaline Phosphatase 82 39 - 117 IU/L   AST 35 0 - 40 IU/L   ALT 48 (H) 0 - 44 IU/L  Lipid Panel w/o Chol/HDL Ratio  Result Value Ref Range   Cholesterol, Total 281 (H) 100 - 199 mg/dL   Triglycerides 883 (HH) 0 - 149 mg/dL   HDL 31 (L) >39 mg/dL   VLDL Cholesterol Cal Comment 5 - 40 mg/dL   LDL Calculated Comment 0 - 99 mg/dL  Ethanol Confirm, Urine  Result Value Ref Range   Ethanol, Urine Positive (A) Cutoff=0.020  Ethanol, Ur - Confirmation 0.104 Cutoff=0.020 %      Assessment & Plan:   Problem List Items Addressed This Visit      Other   Elevated transaminase level    Recheck in 4 weeks now has cessation of alcohol use.      Bipolar 1 disorder (HCC)    Chronic, stable.  Minimal use of Klonopin, praised for this.  Goal is for short term use only.  Would benefit from psychiatry in future if ongoing use needed.  He is aware of this.      EtOH dependence ([place]) - Primary    Has recently quit drinking, 4 days ago, with no withdrawal symptoms.  Is attending [place] meetings and has support person + is looking into rehab.  Praised him for these first steps on his journey and recommended continued cessation.  Will plan on rechecking LFT and cholesterol levels + BP in 4 weeks with cessation of alcohol use.      Close exposure to COVID-19 virus    Exposure to wife and father.  Plan to repeat Covid testing now symptoms present, suspect he is positive.  Script for [employer], Mucinex, and Albuterol sent.  Would avoid narcotic cough medicine due to history of alcohol abuse with recent goal to work towards complete cessation.  He is aware of quarantine  guidelines.  To maintain for 10 days, longer if continued symptoms.        Relevant Orders   Novel Coronavirus, NAA ([facility])      I discussed the assessment and treatment plan with the patient. The patient was provided an opportunity to ask questions and all were answered. The patient agreed with the plan and demonstrated an understanding of the instructions.   The patient was advised to call back or seek an in-person evaluation if the symptoms worsen or if the condition fails to improve as anticipated.   I provided 15 minutes of time during this encounter.  Follow up plan: Return in about 4 weeks (around 03/26/2019) for BP check and labs in office visit, HLD.

## 2019-02-26 NOTE — Assessment & Plan Note (Signed)
Exposure to wife and father.  Plan to repeat Covid testing now symptoms present, suspect he is positive.  Script for [employer], Mucinex, and Albuterol sent.  Would avoid narcotic cough medicine due to history of alcohol abuse with recent goal to work towards complete cessation.  He is aware of quarantine guidelines.  To maintain for 10 days, longer if continued symptoms.

## 2019-02-26 NOTE — Assessment & Plan Note (Signed)
Has recently quit drinking, 4 days ago, with no withdrawal symptoms.  Is attending [place] meetings and has support person + is looking into rehab.  Praised him for these first steps on his journey and recommended continued cessation.  Will plan on rechecking LFT and cholesterol levels + BP in 4 weeks with cessation of alcohol use.

## 2019-02-26 NOTE — Assessment & Plan Note (Signed)
Recheck in 4 weeks now has cessation of alcohol use.

## 2019-02-26 NOTE — Assessment & Plan Note (Signed)
Chronic, stable.  Minimal use of Klonopin, praised for this.  Goal is for short term use only.  Would benefit from psychiatry in future if ongoing use needed.  He is aware of this.

## 2019-02-27 ENCOUNTER — Encounter: Payer: Self-pay | Admitting: Nurse Practitioner

## 2019-02-27 NOTE — Progress Notes (Signed)
Lvm for 4 weeks in office BP check and labs in office visit, [place], Sent letter.

## 2019-03-30 ENCOUNTER — Telehealth: Payer: Self-pay | Admitting: Nurse Practitioner

## 2019-03-30 NOTE — Telephone Encounter (Signed)
LVM for pt to call back.

## 2019-03-30 NOTE — Telephone Encounter (Signed)
-----   Message from Venita Lick, NP sent at 02/26/2019 10:42 AM EST ----- 4 weeks in office BP check and labs in office visit, HLD

## 2019-04-06 ENCOUNTER — Encounter: Payer: Self-pay | Admitting: Nurse Practitioner

## 2019-04-06 ENCOUNTER — Ambulatory Visit (INDEPENDENT_AMBULATORY_CARE_PROVIDER_SITE_OTHER): Payer: Commercial Managed Care - PPO | Admitting: Nurse Practitioner

## 2019-04-06 ENCOUNTER — Other Ambulatory Visit: Payer: Self-pay

## 2019-04-06 VITALS — BP 139/93 | HR 80 | Temp 98.5°F

## 2019-04-06 DIAGNOSIS — E782 Mixed hyperlipidemia: Secondary | ICD-10-CM

## 2019-04-06 DIAGNOSIS — F32A Depression, unspecified: Secondary | ICD-10-CM

## 2019-04-06 DIAGNOSIS — F329 Major depressive disorder, single episode, unspecified: Secondary | ICD-10-CM

## 2019-04-06 DIAGNOSIS — E8881 Metabolic syndrome: Secondary | ICD-10-CM

## 2019-04-06 DIAGNOSIS — I10 Essential (primary) hypertension: Secondary | ICD-10-CM | POA: Diagnosis not present

## 2019-04-06 DIAGNOSIS — F419 Anxiety disorder, unspecified: Secondary | ICD-10-CM

## 2019-04-06 DIAGNOSIS — F10282 Alcohol dependence with alcohol-induced sleep disorder: Secondary | ICD-10-CM | POA: Diagnosis not present

## 2019-04-06 NOTE — Assessment & Plan Note (Signed)
Chronic, ongoing.  Not taking Lopid as recommended.  Recheck cholesterol panel today, ate at 0700 this morning.  Continue to support on alcohol cessation journey.

## 2019-04-06 NOTE — Assessment & Plan Note (Signed)
Praised for continued focus on cessation with cut back approach and working with AA, continue to support him on this trajectory.  Could consider addition of Naltrexone in future to assist in reduction.

## 2019-04-06 NOTE — Patient Instructions (Signed)
DASH Eating Plan DASH stands for "Dietary Approaches to Stop Hypertension." The DASH eating plan is a healthy eating plan that has been shown to reduce high blood pressure (hypertension). It may also reduce your risk for type 2 diabetes, heart disease, and stroke. The DASH eating plan may also help with weight loss. What are tips for following this plan?  General guidelines  Avoid eating more than 2,300 mg (milligrams) of salt (sodium) a day. If you have hypertension, you may need to reduce your sodium intake to 1,500 mg a day.  Limit alcohol intake to no more than 1 drink a day for nonpregnant women and 2 drinks a day for men. One drink equals 12 oz of beer, 5 oz of wine, or 1 oz of hard liquor.  Work with your health care provider to maintain a healthy body weight or to lose weight. Ask what an ideal weight is for you.  Get at least 30 minutes of exercise that causes your heart to beat faster (aerobic exercise) most days of the week. Activities may include walking, swimming, or biking.  Work with your health care provider or diet and nutrition specialist (dietitian) to adjust your eating plan to your individual calorie needs. Reading food labels   Check food labels for the amount of sodium per serving. Choose foods with less than 5 percent of the Daily Value of sodium. Generally, foods with less than 300 mg of sodium per serving fit into this eating plan.  To find whole grains, look for the word "whole" as the first word in the ingredient list. Shopping  Buy products labeled as "low-sodium" or "no salt added."  Buy fresh foods. Avoid canned foods and premade or frozen meals. Cooking  Avoid adding salt when cooking. Use salt-free seasonings or herbs instead of table salt or sea salt. Check with your health care provider or pharmacist before using salt substitutes.  Do not fry foods. Cook foods using healthy methods such as baking, boiling, grilling, and broiling instead.  Cook with  heart-healthy oils, such as olive, canola, soybean, or sunflower oil. Meal planning  Eat a balanced diet that includes: ? 5 or more servings of fruits and vegetables each day. At each meal, try to fill half of your plate with fruits and vegetables. ? Up to 6-8 servings of whole grains each day. ? Less than 6 oz of lean meat, poultry, or fish each day. A 3-oz serving of meat is about the same size as a deck of cards. One egg equals 1 oz. ? 2 servings of low-fat dairy each day. ? A serving of nuts, seeds, or beans 5 times each week. ? Heart-healthy fats. Healthy fats called Omega-3 fatty acids are found in foods such as flaxseeds and coldwater fish, like sardines, salmon, and mackerel.  Limit how much you eat of the following: ? Canned or prepackaged foods. ? Food that is high in trans fat, such as fried foods. ? Food that is high in saturated fat, such as fatty meat. ? Sweets, desserts, sugary drinks, and other foods with added sugar. ? Full-fat dairy products.  Do not salt foods before eating.  Try to eat at least 2 vegetarian meals each week.  Eat more home-cooked food and less restaurant, buffet, and fast food.  When eating at a restaurant, ask that your food be prepared with less salt or no salt, if possible. What foods are recommended? The items listed may not be a complete list. Talk with your dietitian about   what dietary choices are best for you. Grains Whole-grain or whole-wheat bread. Whole-grain or whole-wheat pasta. Brown rice. Oatmeal. Quinoa. Bulgur. Whole-grain and low-sodium cereals. Pita bread. Low-fat, low-sodium crackers. Whole-wheat flour tortillas. Vegetables Fresh or frozen vegetables (raw, steamed, roasted, or grilled). Low-sodium or reduced-sodium tomato and vegetable juice. Low-sodium or reduced-sodium tomato sauce and tomato paste. Low-sodium or reduced-sodium canned vegetables. Fruits All fresh, dried, or frozen fruit. Canned fruit in natural juice (without  added sugar). Meat and other protein foods Skinless chicken or turkey. Ground chicken or turkey. Pork with fat trimmed off. Fish and seafood. Egg whites. Dried beans, peas, or lentils. Unsalted nuts, nut butters, and seeds. Unsalted canned beans. Lean cuts of beef with fat trimmed off. Low-sodium, lean deli meat. Dairy Low-fat (1%) or fat-free (skim) milk. Fat-free, low-fat, or reduced-fat cheeses. Nonfat, low-sodium ricotta or cottage cheese. Low-fat or nonfat yogurt. Low-fat, low-sodium cheese. Fats and oils Soft margarine without trans fats. Vegetable oil. Low-fat, reduced-fat, or light mayonnaise and salad dressings (reduced-sodium). Canola, safflower, olive, soybean, and sunflower oils. Avocado. Seasoning and other foods Herbs. Spices. Seasoning mixes without salt. Unsalted popcorn and pretzels. Fat-free sweets. What foods are not recommended? The items listed may not be a complete list. Talk with your dietitian about what dietary choices are best for you. Grains Baked goods made with fat, such as croissants, muffins, or some breads. Dry pasta or rice meal packs. Vegetables Creamed or fried vegetables. Vegetables in a cheese sauce. Regular canned vegetables (not low-sodium or reduced-sodium). Regular canned tomato sauce and paste (not low-sodium or reduced-sodium). Regular tomato and vegetable juice (not low-sodium or reduced-sodium). Pickles. Olives. Fruits Canned fruit in a light or heavy syrup. Fried fruit. Fruit in cream or butter sauce. Meat and other protein foods Fatty cuts of meat. Ribs. Fried meat. Bacon. Sausage. Bologna and other processed lunch meats. Salami. Fatback. Hotdogs. Bratwurst. Salted nuts and seeds. Canned beans with added salt. Canned or smoked fish. Whole eggs or egg yolks. Chicken or turkey with skin. Dairy Whole or 2% milk, cream, and half-and-half. Whole or full-fat cream cheese. Whole-fat or sweetened yogurt. Full-fat cheese. Nondairy creamers. Whipped toppings.  Processed cheese and cheese spreads. Fats and oils Butter. Stick margarine. Lard. Shortening. Ghee. Bacon fat. Tropical oils, such as coconut, palm kernel, or palm oil. Seasoning and other foods Salted popcorn and pretzels. Onion salt, garlic salt, seasoned salt, table salt, and sea salt. Worcestershire sauce. Tartar sauce. Barbecue sauce. Teriyaki sauce. Soy sauce, including reduced-sodium. Steak sauce. Canned and packaged gravies. Fish sauce. Oyster sauce. Cocktail sauce. Horseradish that you find on the shelf. Ketchup. Mustard. Meat flavorings and tenderizers. Bouillon cubes. Hot sauce and Tabasco sauce. Premade or packaged marinades. Premade or packaged taco seasonings. Relishes. Regular salad dressings. Where to find more information:  National Heart, Lung, and Blood Institute: www.nhlbi.nih.gov  American Heart Association: www.heart.org Summary  The DASH eating plan is a healthy eating plan that has been shown to reduce high blood pressure (hypertension). It may also reduce your risk for type 2 diabetes, heart disease, and stroke.  With the DASH eating plan, you should limit salt (sodium) intake to 2,300 mg a day. If you have hypertension, you may need to reduce your sodium intake to 1,500 mg a day.  When on the DASH eating plan, aim to eat more fresh fruits and vegetables, whole grains, lean proteins, low-fat dairy, and heart-healthy fats.  Work with your health care provider or diet and nutrition specialist (dietitian) to adjust your eating plan to your   individual calorie needs. This information is not intended to replace advice given to you by your health care provider. Make sure you discuss any questions you have with your health care provider. Document Revised: 01/28/2017 Document Reviewed: 02/09/2016 Elsevier Patient Education  2020 Elsevier Inc.  

## 2019-04-06 NOTE — Progress Notes (Signed)
BP (!) 139/93 (BP Location: Left Arm, Cuff Size: Normal)   Pulse 80   Temp 98.5 F (36.9 C) (Oral)   SpO2 96%    Subjective:    Patient ID: Nathan Haws., male    DOB: May 27, 1985, 34 y.o.   MRN: [id number]  HPI: Nathan Proietti. is a 34 y.o. male  Chief Complaint  Patient presents with  . Hypertension  . Drug / Alcohol Assessment   HYPERTENSION Continues Metoprolol 25 MG daily and  Lisinopril 20 MG daily (not taking 40 MG, but plans to restart as has cut back on drinking and BP still elevated).  Not taking Lopid at this time.  Ate at 0700 today.  Currently being followed by weight management and is taking Adipex-P prescribed by them, initial consult 02/15/2019. Hypertension status: stable  Satisfied with current treatment? yes Duration of hypertension: chronic BP monitoring frequency:  not checking BP range:  BP medication side effects:  no Medication compliance: good compliance Aspirin: no Recurrent headaches: no Visual changes: no Palpitations: no Dyspnea: no Chest pain: no Lower extremity edema: no Dizzy/lightheaded: no  The ASCVD Risk score Mikey Bussing DC Jr., et al., 2013) failed to calculate for the following reasons:   The 2013 ASCVD risk score is only valid for ages 68 to 60   ALCOHOL ABUSE: Not drinking on the weekends and only after work at this time, 2-3 alcoholic drinks. Reports that he has been tolerating without withdrawal symptoms, went 11 days sober during Christmas when off work.  Is going to [employer] groups, just received book to work on.  Is very focused on changing.  Can not afford rehab services.  DEPRESSION Reports his mood "has been fine".  Takes Klonopin minimally, has not used this since December.  Last PMP fill 12/23/2018.  Mood status: stable Satisfied with current treatment?: yes Symptom severity: mild  Duration of current treatment : chronic Side effects: no Medication compliance: good compliance Psychotherapy/counseling: none Previous  psychiatric medications: Klonopin Depressed mood: no Anxious mood: yes Anhedonia: no Significant weight loss or gain: no Insomnia: yes hard to fall asleep Fatigue: no Feelings of worthlessness or guilt: no Impaired concentration/indecisiveness: no Suicidal ideations: no Hopelessness: no Crying spells: no Depression screen [facility] 2/9 02/26/2019 02/05/2019 10/25/2018 10/21/2017 09/19/2017  Decreased Interest 0 0 0 0 0  Down, Depressed, Hopeless 0 0 1 0 0  PHQ - 2 Score 0 0 1 0 0  Altered sleeping 1 3 3  0 3  Tired, decreased energy 1 0 1 0 1  Change in appetite 1 0 1 0 1  Feeling bad or failure about yourself  0 0 1 0 0  Trouble concentrating 0 0 0 0 0  Moving slowly or fidgety/restless 0 0 0 0 0  Suicidal thoughts 0 0 0 0 0  PHQ-9 Score 3 3 7  0 5  Difficult doing work/chores Not difficult at all Not difficult at all - - -     Relevant past medical, surgical, family and social history reviewed and updated as indicated. Interim medical history since our last visit reviewed. Allergies and medications reviewed and updated.  Review of Systems  Constitutional: Negative for activity change, diaphoresis, fatigue and fever.  Respiratory: Negative for cough, chest tightness, shortness of breath and wheezing.   Cardiovascular: Negative for chest pain, palpitations and leg swelling.  Gastrointestinal: Negative.   Skin: Negative.   Neurological: Negative.   Psychiatric/Behavioral: Negative for decreased concentration, self-injury, sleep disturbance and suicidal ideas.  Per HPI unless specifically indicated above     Objective:    BP (!) 139/93 (BP Location: Left Arm, Cuff Size: Normal)   Pulse 80   Temp 98.5 F (36.9 C) (Oral)   SpO2 96%   Wt Readings from Last 3 Encounters:  11/29/18 218 lb (98.9 kg)  11/11/17 236 lb 9.6 oz (107.3 kg)  10/21/17 233 lb (105.7 kg)    Physical Exam Vitals and nursing note reviewed.  Constitutional:      General: He is not in acute distress.     Appearance: He is well-developed and well-groomed. He is obese. He is not ill-appearing.  HENT:     Head: Normocephalic and atraumatic.     Right Ear: Hearing normal. No drainage.     Left Ear: Hearing normal. No drainage.  Eyes:     General: Lids are normal.        Right eye: No discharge.        Left eye: No discharge.     Conjunctiva/sclera: Conjunctivae normal.     Pupils: Pupils are equal, round, and reactive to light.  Neck:     Thyroid: No thyromegaly.     Vascular: No carotid bruit.  Cardiovascular:     Rate and Rhythm: Normal rate and regular rhythm.     Heart sounds: Normal heart sounds, S1 normal and S2 normal. No murmur. No gallop.   Pulmonary:     Effort: Pulmonary effort is normal. No accessory muscle usage or respiratory distress.     Breath sounds: Normal breath sounds.  Abdominal:     General: Bowel sounds are normal.     Palpations: Abdomen is soft.  Musculoskeletal:        General: Normal range of motion.     Cervical back: Normal range of motion and neck supple.     Right lower leg: No edema.     Left lower leg: No edema.  Skin:    General: Skin is warm and dry.  Neurological:     Mental Status: He is alert and oriented to person, place, and time.  Psychiatric:        Mood and Affect: Mood normal.        Behavior: Behavior normal.        Thought Content: Thought content normal.        Judgment: Judgment normal.    Results for orders placed or performed in visit on 10/25/18  Urine drugs of abuse scrn w alc, routine (Ref Lab)  Result Value Ref Range   Amphetamines, Urine Negative Cutoff=1000 ng/mL   Barbiturate Quant, Ur Negative Cutoff=300 ng/mL   Benzodiazepine Quant, Ur Negative Cutoff=300 ng/mL   Cannabinoid Quant, Ur Negative Cutoff=50 ng/mL   Cocaine (Metab.) Negative Cutoff=300 ng/mL   Opiate Quant, Ur Negative Cutoff=300 ng/mL   PCP Quant, Ur Negative Cutoff=25 ng/mL   Methadone Screen, Urine Negative Cutoff=300 ng/mL   Propoxyphene Negative  Cutoff=300 ng/mL   Ethanol, Urine See Final Results Cutoff=0.020 %  Comprehensive metabolic panel  Result Value Ref Range   Glucose 102 (H) 65 - 99 mg/dL   BUN 10 6 - 20 mg/dL   Creatinine, Ser 1.02 0.76 - 1.27 mg/dL   GFR calc non Af Amer 95 >59 mL/min/1.73   GFR calc Af Amer 110 >59 mL/min/1.73   BUN/Creatinine Ratio 10 9 - 20   Sodium 140 134 - 144 mmol/L   Potassium 4.2 3.5 - 5.2 mmol/L   Chloride 98 96 - 106 mmol/L  CO2 23 20 - 29 mmol/L   Calcium 9.4 8.7 - 10.2 mg/dL   Total Protein 7.1 6.0 - 8.5 g/dL   Albumin 4.6 4.0 - 5.0 g/dL   Globulin, Total 2.5 1.5 - 4.5 g/dL   Albumin/Globulin Ratio 1.8 1.2 - 2.2   Bilirubin Total 0.5 0.0 - 1.2 mg/dL   Alkaline Phosphatase 82 39 - 117 IU/L   AST 35 0 - 40 IU/L   ALT 48 (H) 0 - 44 IU/L  Lipid Panel w/o Chol/HDL Ratio  Result Value Ref Range   Cholesterol, Total 281 (H) 100 - 199 mg/dL   Triglycerides 883 (HH) 0 - 149 mg/dL   HDL 31 (L) >39 mg/dL   VLDL Cholesterol Cal Comment 5 - 40 mg/dL   LDL Calculated Comment 0 - 99 mg/dL  Ethanol Confirm, Urine  Result Value Ref Range   Ethanol, Urine Positive (A) Cutoff=0.020   Ethanol, Ur - Confirmation 0.104 Cutoff=0.020 %      Assessment & Plan:   Problem List Items Addressed This Visit      Cardiovascular and Mediastinum   Hypertension    Chronic, ongoing with initial BP mildly elevated, but improved from previous in office BP.  Will continue current medication regimen and adjust as needed.  Recommend he take Lisinopril 40 MG as ordered and not 1/2 tablet.  Check BP at home at least 3 mornings a week.  Continue cutting back on alcohol use.  Return in 3 months.  Labs today, CMP and TSH.      Relevant Orders   Comprehensive metabolic panel   TSH     Other   Mixed hyperlipidemia    Chronic, ongoing.  Not taking Lopid as recommended.  Recheck cholesterol panel today, ate at 0700 this morning.  Continue to support on alcohol cessation journey.      Relevant Orders   Lipid  Panel w/o Chol/HDL Ratio   Anxiety and depression    Chronic, ongoing.  Is minimally using Klonopin now that is cutting back on alcohol.  Continue minimal use of this.  Pt is aware of risks of benzo medication use to include increased sedation, respiratory suppression, falls, dependence and cardiovascular events.  Pt would like to continue treatment as benefit determined to outweigh risk.  UDS and contract next visit.  Continue to recommend alternate medications and support on alcohol cessation.       EtOH dependence ([place]) - Primary    Praised for continued focus on cessation with cut back approach and working with AA, continue to support him on this trajectory.  Could consider addition of Naltrexone in future to assist in reduction.        Metabolic syndrome    Continue collaboration with weight management group and support patient on weight loss journey.  Labs today.          Follow up plan: Return in about 3 months (around 07/04/2019) for HTN/HLD, Alcohol abuse.

## 2019-04-06 NOTE — Assessment & Plan Note (Signed)
Chronic, ongoing with initial BP mildly elevated, but improved from previous in office BP.  Will continue current medication regimen and adjust as needed.  Recommend he take Lisinopril 40 MG as ordered and not 1/2 tablet.  Check BP at home at least 3 mornings a week.  Continue cutting back on alcohol use.  Return in 3 months.  Labs today, CMP and TSH.

## 2019-04-06 NOTE — Assessment & Plan Note (Signed)
Chronic, ongoing.  Is minimally using Klonopin now that is cutting back on alcohol.  Continue minimal use of this.  Pt is aware of risks of benzo medication use to include increased sedation, respiratory suppression, falls, dependence and cardiovascular events.  Pt would like to continue treatment as benefit determined to outweigh risk.  UDS and contract next visit.  Continue to recommend alternate medications and support on alcohol cessation.

## 2019-04-06 NOTE — Assessment & Plan Note (Signed)
Continue collaboration with weight management group and support patient on weight loss journey.  Labs today.

## 2019-04-07 LAB — LIPID PANEL W/O CHOL/HDL RATIO
Cholesterol, Total: 255 mg/dL — ABNORMAL HIGH (ref 100–199)
HDL: 32 mg/dL — ABNORMAL LOW (ref >39)
LDL Chol Calc (NIH): 143 mg/dL — ABNORMAL HIGH (ref 0–99)
Triglycerides: 432 mg/dL — ABNORMAL HIGH (ref 0–149)
VLDL Cholesterol Cal: 80 mg/dL — ABNORMAL HIGH (ref 5–40)

## 2019-04-07 LAB — COMPREHENSIVE METABOLIC PANEL
ALT: 65 IU/L — ABNORMAL HIGH (ref 0–44)
AST: 58 IU/L — ABNORMAL HIGH (ref 0–40)
Albumin/Globulin Ratio: 1.9 (ref 1.2–2.2)
Albumin: 4.7 g/dL (ref 4.0–5.0)
Alkaline Phosphatase: 105 IU/L (ref 39–117)
BUN/Creatinine Ratio: 8 — ABNORMAL LOW (ref 9–20)
BUN: 8 mg/dL (ref 6–20)
Bilirubin Total: 0.9 mg/dL (ref 0.0–1.2)
CO2: 22 mmol/L (ref 20–29)
Calcium: 9.6 mg/dL (ref 8.7–10.2)
Chloride: 100 mmol/L (ref 96–106)
Creatinine, Ser: 1.04 mg/dL (ref 0.76–1.27)
GFR calc Af Amer: 108 mL/min/{1.73_m2} (ref >59)
GFR calc non Af Amer: 93 mL/min/{1.73_m2} (ref >59)
Globulin, Total: 2.5 g/dL (ref 1.5–4.5)
Glucose: 89 mg/dL (ref 65–99)
Potassium: 4.3 mmol/L (ref 3.5–5.2)
Sodium: 139 mmol/L (ref 134–144)
Total Protein: 7.2 g/dL (ref 6.0–8.5)

## 2019-04-07 LAB — TSH: TSH: 0.76 u[IU]/mL (ref 0.450–4.500)

## 2019-04-07 NOTE — Progress Notes (Signed)
Contacted via MyChart The ASCVD Risk score Mikey Bussing DC Jr., et al., 2013) failed to calculate for the following reasons:   The 2013 ASCVD risk score is only valid for ages 84 to 13

## 2019-04-12 NOTE — Progress Notes (Signed)
Did not view MyChart message, please alert him via letter or phone of following: Good morning [name] you are well this weekend.  Your labs have returned.  Cholesterol levels remain high, especially triglycerides.  I would recommend taking the Lopid daily to avoid pancreatitis, higher triglycerides can cause this.  They are lower than previous, but would like to see them lower.  Liver function tests remain slightly elevated, continue cutting back on alcohol intake like you are.  Great job!!  Thyroid test normal.  Keep up the great work!!

## 2019-04-22 ENCOUNTER — Other Ambulatory Visit: Payer: Self-pay | Admitting: Unknown Physician Specialty

## 2019-04-23 NOTE — Telephone Encounter (Signed)
Next appt is in May. 

## 2019-04-25 ENCOUNTER — Ambulatory Visit: Payer: Self-pay

## 2019-04-25 NOTE — Chronic Care Management (AMB) (Signed)
  Chronic Care Management    Clinical Social Work Follow Up Note  04/25/2019 Name: Edilson Gan. MRN: [id number] DOB: [birth year]  [name] is a 34 y.o. year old male who is a primary care patient of Cannady, Barbaraann Faster, NP. The CCM team was consulted for assistance with Mental Health Counseling and Resources regarding substance use.   Review of patient status, including review of consultants reports, other relevant assessments, and collaboration with appropriate care team members and the patient's provider was performed as part of comprehensive patient evaluation and provision of chronic care management services.    SDOH (Social Determinants of Health) assessments performed: No    Advanced Directives Status: <no information> See Care Plan for related entries.   Outpatient Encounter Medications as of 04/25/2019  Medication Sig  . albuterol (VENTOLIN HFA) 108 (90 Base) MCG/ACT inhaler Inhale 2 puffs into the lungs every 6 (six) hours as needed for wheezing or shortness of breath.  . allopurinol (ZYLOPRIM) 300 MG tablet TAKE 1 TABLET BY MOUTH EVERY DAY  . clonazePAM (KLONOPIN) 0.5 MG tablet Take 1 tablet (0.5 mg total) by mouth 2 (two) times daily as needed for anxiety.  . colchicine 0.6 MG tablet Take 0.6 mg by mouth daily as needed.   Marland Kitchen esomeprazole (NEXIUM) 40 MG capsule Take 1 capsule (40 mg total) by mouth daily at 12 noon.  . gabapentin (NEURONTIN) 300 MG capsule Take 300 mg by mouth 2 (two) times daily.  Marland Kitchen gemfibrozil (LOPID) 600 MG tablet Take 1 tablet (600 mg total) by mouth 2 (two) times daily before a meal. (Patient not taking: Reported on 02/05/2019)  . lisinopril (ZESTRIL) 40 MG tablet Take 1 tablet (40 mg total) by mouth daily.  . metoprolol succinate (TOPROL-XL) 25 MG 24 hr tablet TAKE 1 TABLET BY MOUTH EVERY DAY  . phentermine (ADIPEX-P) 37.5 MG tablet Take 1 tablet by mouth daily.   No facility-administered encounter medications on file as of 04/25/2019.     LCSW  completed CCM follow up call and patient answered but stated this was not a good time for him to talk. He ask that LCSW reschedule appointment for 04/26/2018. Appointment successfully rescheduled.  Follow Up Plan: SW will follow up with patient by phone over the next 2 days  Eula Fried, [place], MSW, [email or web address] Phone: [phone]

## 2019-04-27 ENCOUNTER — Ambulatory Visit (INDEPENDENT_AMBULATORY_CARE_PROVIDER_SITE_OTHER): Payer: Commercial Managed Care - PPO | Admitting: Licensed Clinical Social Worker

## 2019-04-27 DIAGNOSIS — F319 Bipolar disorder, unspecified: Secondary | ICD-10-CM

## 2019-04-27 DIAGNOSIS — I10 Essential (primary) hypertension: Secondary | ICD-10-CM | POA: Diagnosis not present

## 2019-04-27 DIAGNOSIS — F10282 Alcohol dependence with alcohol-induced sleep disorder: Secondary | ICD-10-CM

## 2019-04-27 DIAGNOSIS — F419 Anxiety disorder, unspecified: Secondary | ICD-10-CM | POA: Diagnosis not present

## 2019-04-27 DIAGNOSIS — E782 Mixed hyperlipidemia: Secondary | ICD-10-CM | POA: Diagnosis not present

## 2019-04-27 DIAGNOSIS — F329 Major depressive disorder, single episode, unspecified: Secondary | ICD-10-CM

## 2019-04-27 DIAGNOSIS — F32A Depression, unspecified: Secondary | ICD-10-CM

## 2019-04-27 NOTE — Chronic Care Management (AMB) (Signed)
Chronic Care Management    Clinical Social Work Follow Up Note  04/27/2019 Name: [name]. MRN: [id number] DOB: 20-Jul-1985  Nathan Haws. is a 34 y.o. year old male who is a primary care patient of Cannady, Barbaraann Faster, NP. The CCM team was consulted for assistance with Substance Abuse.  Review of patient status, including review of consultants reports, other relevant assessments, and collaboration with appropriate care team members and the patient's provider was performed as part of comprehensive patient evaluation and provision of chronic care management services.    SDOH (Social Determinants of Health) assessments performed: Yes    Advanced Directives Status: <no information> See Care Plan for related entries.   Outpatient Encounter Medications as of 04/27/2019  Medication Sig  . albuterol (VENTOLIN HFA) 108 (90 Base) MCG/ACT inhaler Inhale 2 puffs into the lungs every 6 (six) hours as needed for wheezing or shortness of breath.  . allopurinol (ZYLOPRIM) 300 MG tablet TAKE 1 TABLET BY MOUTH EVERY DAY  . clonazePAM (KLONOPIN) 0.5 MG tablet Take 1 tablet (0.5 mg total) by mouth 2 (two) times daily as needed for anxiety.  . colchicine 0.6 MG tablet Take 0.6 mg by mouth daily as needed.   Marland Kitchen esomeprazole (NEXIUM) 40 MG capsule Take 1 capsule (40 mg total) by mouth daily at 12 noon.  . gabapentin (NEURONTIN) 300 MG capsule Take 300 mg by mouth 2 (two) times daily.  Marland Kitchen gemfibrozil (LOPID) 600 MG tablet Take 1 tablet (600 mg total) by mouth 2 (two) times daily before a meal. (Patient not taking: Reported on 02/05/2019)  . lisinopril (ZESTRIL) 40 MG tablet Take 1 tablet (40 mg total) by mouth daily.  . metoprolol succinate (TOPROL-XL) 25 MG 24 hr tablet TAKE 1 TABLET BY MOUTH EVERY DAY  . phentermine (ADIPEX-P) 37.5 MG tablet Take 1 tablet by mouth daily.   No facility-administered encounter medications on file as of 04/27/2019.     Goals Addressed    . "Deng is wanting to stop  drinking and work on his stress management/anxiety." (pt-stated)       Current Barriers:  . Chronic Mental Health needs related to Anxiety, Stress Management and Alcoholism  . Limited social support . Mental Health Concerns  . Limited education about Anxiety Management* . Lacks knowledge of community resource: Available SA resources within the area and local AA support groups . Suicidal Ideation/Homicidal Ideation: No  Clinical Social Work Goal(s):  Marland Kitchen Over the next 120 days, patient will work with SW  bi-monthly  by telephone or in person to reduce or manage symptoms related to decreasing drinking and improving mental health  . Over the next 120 days, patient will demonstrate improved health management independence as evidenced by attending AA meetings   Interventions: . Patient interviewed and appropriate assessments performed: brief mental health assessment . Patient interviewed and appropriate assessments performed . Discussed plans with patient for ongoing care management follow up and provided patient with direct contact information for care management team . Advised patient to check their email and print resources. Spouse agreeable to hand deliver them to patient once he gets home from work . Assisted patient/caregiver with obtaining information about health plan benefits . Referred patient to [facility] (mental health provider) for long term follow up and therapy/counseling . Solution-Focused Strategies provided during session . Patient recently changed his gabapentin medication that was prescribed at [facility] but had adverse side effects. Patient is interested in  . Patient reports that he recently  quit alcohol usage for 11 days this month while he was during quarantine. Patient shares that he has started drinking again but has decreased his usage. He reports that the goal is for him to eventually not use alcohol at all. Motivational interviewing interventions provided  throughout entire session. Marland Kitchen Positive reinforcement provided for continued focus on cessation with cut back approach and working with AA/LCSW to maintain/support this journey  Patient Self Care Activities:  . Ability for insight . Motivation for treatment . Strong family or social support  Patient Coping Strengths:  . Hopefulness . Self Advocate  Patient Self Care Deficits:  . Lacks social connections  Please see past updates related to this goal by clicking on the "Past Updates" button in the selected goal      Follow Up Plan: SW will follow up with patient by phone over the next quarter  Eula Fried, [place], MSW, [email or web address] Phone: [phone]

## 2019-06-29 ENCOUNTER — Ambulatory Visit: Payer: Self-pay

## 2019-06-29 NOTE — Chronic Care Management (AMB) (Signed)
  Care Management   Follow Up Note   06/29/2019 Name: [name]. MRN: [id number] DOB: 09/01/1985  Referred by: Venita Lick, NP Reason for referral : Care Coordination   Nathan Haws. is a 34 y.o. year old male who is a primary care patient of Cannady, Barbaraann Faster, NP. The care management team was consulted for assistance with care management and care coordination needs.    Review of patient status, including review of consultants reports, relevant laboratory and other test results, and collaboration with appropriate care team members and the patient's provider was performed as part of comprehensive patient evaluation and provision of chronic care management services.    LCSW completed CCM outreach attempt today but was unable to reach patient successfully. A HIPPA compliant voice message was left encouraging patient to return call once available. LCSW rescheduled CCM SW appointment as well.  A HIPPA compliant phone message was left for the patient providing contact information and requesting a return call.   Eula Fried, BSW, MSW, [facility] Care Management [email or web address] Phone: [phone]

## 2019-07-11 ENCOUNTER — Other Ambulatory Visit: Payer: Self-pay

## 2019-07-11 ENCOUNTER — Ambulatory Visit (INDEPENDENT_AMBULATORY_CARE_PROVIDER_SITE_OTHER): Payer: Commercial Managed Care - PPO | Admitting: Nurse Practitioner

## 2019-07-11 ENCOUNTER — Encounter: Payer: Self-pay | Admitting: Nurse Practitioner

## 2019-07-11 VITALS — BP 132/82 | HR 93 | Temp 98.1°F | Wt 233.4 lb

## 2019-07-11 DIAGNOSIS — E782 Mixed hyperlipidemia: Secondary | ICD-10-CM | POA: Diagnosis not present

## 2019-07-11 DIAGNOSIS — Z6838 Body mass index (BMI) 38.0-38.9, adult: Secondary | ICD-10-CM

## 2019-07-11 DIAGNOSIS — I10 Essential (primary) hypertension: Secondary | ICD-10-CM

## 2019-07-11 DIAGNOSIS — E669 Obesity, unspecified: Secondary | ICD-10-CM | POA: Insufficient documentation

## 2019-07-11 DIAGNOSIS — F329 Major depressive disorder, single episode, unspecified: Secondary | ICD-10-CM

## 2019-07-11 DIAGNOSIS — F32A Depression, unspecified: Secondary | ICD-10-CM

## 2019-07-11 DIAGNOSIS — F10282 Alcohol dependence with alcohol-induced sleep disorder: Secondary | ICD-10-CM

## 2019-07-11 DIAGNOSIS — F419 Anxiety disorder, unspecified: Secondary | ICD-10-CM | POA: Diagnosis not present

## 2019-07-11 DIAGNOSIS — E6609 Other obesity due to excess calories: Secondary | ICD-10-CM

## 2019-07-11 NOTE — Assessment & Plan Note (Signed)
Chronic, ongoing with BP at goal today.  Will continue current medication regimen and adjust as needed.  Check BP at home at least 3 mornings a week.  Continue cutting back on alcohol use.  Return in 6 months and will obtain labs.

## 2019-07-11 NOTE — Patient Instructions (Signed)

## 2019-07-11 NOTE — Assessment & Plan Note (Signed)
Praised for continued focus on cessation with cut back approach and working with AA (recommend he return to AA meetings), continue to support him on this trajectory.  Could consider addition of Naltrexone in future to assist in reduction.   °

## 2019-07-11 NOTE — Assessment & Plan Note (Addendum)
Recommended eating smaller high protein, low fat meals more frequently and exercising 30 mins a day 5 times a week with a goal of 10-15lb weight loss in the next 3 months. Patient voiced their understanding and motivation to adhere to these recommendations.  Recommend he return to weight loss clinic and continue current medication regimen.

## 2019-07-11 NOTE — Assessment & Plan Note (Signed)
Chronic, ongoing.  Is minimally using Klonopin now that is cutting back on alcohol -- last filled October and has 15 pills left.  Continue minimal use of this.  Pt is aware of risks of benzo medication use to include increased sedation, respiratory suppression, falls, dependence and cardiovascular events.  Pt would like to continue treatment as benefit determined to outweigh risk.  UDS and contract next visit.  Continue to recommend alternate medications and support on alcohol cessation.

## 2019-07-11 NOTE — Assessment & Plan Note (Signed)
Chronic, ongoing.  Not taking Lopid as recommended.  Recheck cholesterol panel next visit, does not wish to have labs today.  Continue to support on alcohol cessation journey.
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
## 2019-08-08 ENCOUNTER — Other Ambulatory Visit: Payer: Self-pay

## 2019-08-08 ENCOUNTER — Encounter: Payer: Self-pay | Admitting: Nurse Practitioner

## 2019-08-08 ENCOUNTER — Ambulatory Visit (INDEPENDENT_AMBULATORY_CARE_PROVIDER_SITE_OTHER): Payer: Commercial Managed Care - PPO | Admitting: Nurse Practitioner

## 2019-08-08 VITALS — BP 137/82 | HR 91 | Temp 98.2°F | Ht 65.3 in | Wt 235.2 lb

## 2019-08-08 DIAGNOSIS — F32A Depression, unspecified: Secondary | ICD-10-CM

## 2019-08-08 DIAGNOSIS — F10282 Alcohol dependence with alcohol-induced sleep disorder: Secondary | ICD-10-CM | POA: Diagnosis not present

## 2019-08-08 DIAGNOSIS — I10 Essential (primary) hypertension: Secondary | ICD-10-CM | POA: Diagnosis not present

## 2019-08-08 DIAGNOSIS — R7401 Elevation of levels of liver transaminase levels: Secondary | ICD-10-CM

## 2019-08-08 DIAGNOSIS — Z6838 Body mass index (BMI) 38.0-38.9, adult: Secondary | ICD-10-CM

## 2019-08-08 DIAGNOSIS — F329 Major depressive disorder, single episode, unspecified: Secondary | ICD-10-CM

## 2019-08-08 DIAGNOSIS — E782 Mixed hyperlipidemia: Secondary | ICD-10-CM

## 2019-08-08 DIAGNOSIS — Z0000 Encounter for general adult medical examination without abnormal findings: Secondary | ICD-10-CM

## 2019-08-08 DIAGNOSIS — F419 Anxiety disorder, unspecified: Secondary | ICD-10-CM | POA: Diagnosis not present

## 2019-08-08 DIAGNOSIS — M1A9XX0 Chronic gout, unspecified, without tophus (tophi): Secondary | ICD-10-CM

## 2019-08-08 DIAGNOSIS — E6609 Other obesity due to excess calories: Secondary | ICD-10-CM

## 2019-08-08 DIAGNOSIS — K219 Gastro-esophageal reflux disease without esophagitis: Secondary | ICD-10-CM

## 2019-08-08 MED ORDER — METOPROLOL SUCCINATE ER 25 MG PO TB24: 25.0000 mg | ORAL_TABLET | Freq: Every day | ORAL | 4 refills | Status: DC

## 2019-08-08 MED ORDER — LISINOPRIL 40 MG PO TABS: 40.0000 mg | ORAL_TABLET | Freq: Every day | ORAL | 4 refills | Status: DC

## 2019-08-08 NOTE — Assessment & Plan Note (Signed)
Chronic, stable with Allopurinol.  No recent flares.  Check uric acid level today.

## 2019-08-08 NOTE — Assessment & Plan Note (Signed)
Check CMP today and recommend continue to cut back on alcohol.

## 2019-08-08 NOTE — Assessment & Plan Note (Signed)
Chronic, stable.  Continue Nexium which offers him benefit and obtain Mag level annually.

## 2019-08-08 NOTE — Progress Notes (Signed)
BP 137/82   Pulse 91   Temp 98.2 F (36.8 C) (Oral)   Ht 5' 5.3" (1.659 m)   Wt 235 lb 3.2 oz (106.7 kg)   SpO2 96%   BMI 38.78 kg/m    Subjective:    Patient ID: Nathan Haws., male    DOB: Mar 19, 1985, 34 y.o.   MRN: [id number]  HPI: Nathan Wangerin. is a 34 y.o. male presenting on 08/08/2019 for comprehensive medical examination. Current medical complaints include:none  He currently lives with: wife Interim Problems from his last visit: no   HYPERTENSION / HYPERLIPIDEMIA Continues on Metoprolol XL 25 MG and Lisinopril 40 MG daily. Stopped taking Lopid and is working on diet and exercise.  Has history of gout, taking Allopurinol 300 MG, has Colchicine 0.6 MG PRN -- has not had flare in years.   For GERD takes Nexium which he reports offers a lot of benefit. Satisfied with current treatment? yes Duration of hypertension: chronic BP monitoring frequency: not checking BP range:  BP medication side effects: no Duration of hyperlipidemia: chronic Aspirin: no Recent stressors: no Recurrent headaches: no Visual changes: no Palpitations: no Dyspnea: no Chest pain: no Lower extremity edema: no Dizzy/lightheaded: no  ALCOHOL ABUSE: Wine during week occasionally + occasional drinks on weekend, has cut back a lot.  Reports that he has been tolerating cut back without withdrawal symptoms.  Had been going to [place] groups in person and has support people to talk to.  Recent LFT had trended downwards AST/ALT 58/65.  DEPRESSION Reports his mood "has been fine".  Takes Klonopin minimally.  Refuses to try alternate medications and is aware that Klonopin is only a short term medication and no refills will be placed. Mood status: stable Satisfied with current treatment?: yes Symptom severity: mild  Duration of current treatment : chronic Side effects: no Medication compliance: good compliance Psychotherapy/counseling: none Previous psychiatric medications: Klonopin Depressed mood:  no Anxious mood: yes Anhedonia: no Significant weight loss or gain: no Insomnia: yes hard to fall asleep Fatigue: no Feelings of worthlessness or guilt: no Impaired concentration/indecisiveness: no Suicidal ideations: no Hopelessness: no Crying spells: no Depression screen [facility] 2/9 08/08/2019 07/11/2019 02/26/2019 02/05/2019 10/25/2018  Decreased Interest 0 0 0 0 0  Down, Depressed, Hopeless 0 0 0 0 1  PHQ - 2 Score 0 0 0 0 1  Altered sleeping 0 0 1 3 3   Tired, decreased energy 0 1 1 0 1  Change in appetite 0 0 1 0 1  Feeling bad or failure about yourself  0 0 0 0 1  Trouble concentrating 0 0 0 0 0  Moving slowly or fidgety/restless 0 0 0 0 0  Suicidal thoughts 0 0 0 0 0  PHQ-9 Score 0 1 3 3 7   Difficult doing work/chores Not difficult at all Not difficult at all Not difficult at all Not difficult at all -    Functional Status Survey: Is the patient deaf or have difficulty hearing?: No Does the patient have difficulty seeing, even when wearing glasses/contacts?: No Does the patient have difficulty concentrating, remembering, or making decisions?: No Does the patient have difficulty walking or climbing stairs?: No Does the patient have difficulty dressing or bathing?: No Does the patient have difficulty doing errands alone such as visiting a doctor's office or shopping?: No  FALL RISK: Fall Risk  08/08/2019 02/05/2019 03/21/2015  Falls in the past year? 0 0 No  Number falls in past yr: 0 0 -  Injury with Fall? 0 0 -  Follow up Falls evaluation completed Falls evaluation completed -   Advanced Directives <no information>  Past Medical History:  Past Medical History:  Diagnosis Date  . ADHD (attention deficit hyperactivity disorder)   . Anxiety   . Bipolar 1 disorder ([place])   . Bipolar 1 disorder ([place]) 08/19/2014  . Chronic back pain   . Depression   . GERD (gastroesophageal reflux disease)   . Gout   . Hyperlipidemia     Surgical History:  Past Surgical History:    Procedure Laterality Date  . APPENDECTOMY    . KIDNEY STONE SURGERY      Medications:  Current Outpatient Medications on File Prior to Visit  Medication Sig  . allopurinol (ZYLOPRIM) 300 MG tablet TAKE 1 TABLET BY MOUTH EVERY DAY  . clonazePAM (KLONOPIN) 0.5 MG tablet Take 1 tablet (0.5 mg total) by mouth 2 (two) times daily as needed for anxiety.  . colchicine 0.6 MG tablet Take 0.6 mg by mouth daily as needed.   Marland Kitchen esomeprazole (NEXIUM) 20 MG capsule Take 20 mg by mouth daily at 12 noon.  Marland Kitchen albuterol (VENTOLIN HFA) 108 (90 Base) MCG/ACT inhaler Inhale 2 puffs into the lungs every 6 (six) hours as needed for wheezing or shortness of breath. (Patient not taking: Reported on 07/11/2019)   No current facility-administered medications on file prior to visit.    Allergies:  Allergies  Allergen Reactions  . Cyclobenzaprine Other (See Comments)    Dizziness  . Penicillin G Benzathine   . Amoxicillin Rash    Social History:  Social History   Socioeconomic History  . Marital status: Married    Spouse name: Not on file  . Number of children: Not on file  . Years of education: Not on file  . Highest education level: Not on file  Occupational History  . Not on file  Tobacco Use  . Smoking status: Never Smoker  . Smokeless tobacco: Current User    Types: Chew  Substance and Sexual Activity  . Alcohol use: Yes    Comment: 1 pint of liquor  . Drug use: No  . Sexual activity: Yes  Other Topics Concern  . Not on file  Social History Narrative  . Not on file   Social Determinants of Health   Financial Resource Strain:   . Difficulty of Paying Living Expenses:   Food Insecurity:   . Worried About Charity fundraiser in the Last Year:   . Arboriculturist in the Last Year:   Transportation Needs:   . Film/video editor (Medical):   Marland Kitchen Lack of Transportation (Non-Medical):   Physical Activity:   . Days of Exercise per Week:   . Minutes of Exercise per Session:   Stress:    . Feeling of Stress :   Social Connections:   . Frequency of Communication with Friends and Family:   . Frequency of Social Gatherings with Friends and Family:   . Attends Religious Services:   . Active Member of Clubs or Organizations:   . Attends Archivist Meetings:   Marland Kitchen Marital Status:   Intimate Partner Violence:   . Fear of Current or Ex-Partner:   . Emotionally Abused:   Marland Kitchen Physically Abused:   . Sexually Abused:    Social History   Tobacco Use  Smoking Status Never Smoker  Smokeless Tobacco Current User  . Types: Chew   Social History   Substance and  Sexual Activity  Alcohol Use Yes   Comment: 1 pint of liquor    Family History:  Family History  Problem Relation Age of Onset  . Hyperlipidemia Father   . Cancer Paternal Grandmother        pancreatic    Past medical history, surgical history, medications, allergies, family history and social history reviewed with patient today and changes made to appropriate areas of the chart.   Review of Systems - negative All other ROS negative except what is listed above and in the HPI.      Objective:    BP 137/82   Pulse 91   Temp 98.2 F (36.8 C) (Oral)   Ht 5' 5.3" (1.659 m)   Wt 235 lb 3.2 oz (106.7 kg)   SpO2 96%   BMI 38.78 kg/m   Wt Readings from Last 3 Encounters:  08/08/19 235 lb 3.2 oz (106.7 kg)  07/11/19 233 lb 6.4 oz (105.9 kg)  11/29/18 218 lb (98.9 kg)    Physical Exam Vitals and nursing note reviewed.  Constitutional:      General: He is awake. He is not in acute distress.    Appearance: He is well-developed and well-groomed. He is obese. He is not ill-appearing.  HENT:     Head: Normocephalic and atraumatic.     Right Ear: Hearing, tympanic membrane, ear canal and external ear normal. No drainage.     Left Ear: Hearing, tympanic membrane, ear canal and external ear normal. No drainage.     Nose: Nose normal.     Mouth/Throat:     Pharynx: Uvula midline.  Eyes:     General:  Lids are normal.        Right eye: No discharge.        Left eye: No discharge.     Extraocular Movements: Extraocular movements intact.     Conjunctiva/sclera: Conjunctivae normal.     Pupils: Pupils are equal, round, and reactive to light.     Visual Fields: Right eye visual fields normal and left eye visual fields normal.  Neck:     Thyroid: No thyromegaly.     Vascular: No carotid bruit or JVD.     Trachea: Trachea normal.  Cardiovascular:     Rate and Rhythm: Normal rate and regular rhythm.     Heart sounds: Normal heart sounds, S1 normal and S2 normal. No murmur. No gallop.   Pulmonary:     Effort: Pulmonary effort is normal. No accessory muscle usage or respiratory distress.     Breath sounds: Normal breath sounds.  Abdominal:     General: Bowel sounds are normal.     Palpations: Abdomen is soft. There is no hepatomegaly or splenomegaly.     Tenderness: There is no abdominal tenderness.  Musculoskeletal:        General: Normal range of motion.     Cervical back: Normal range of motion and neck supple.     Right lower leg: No edema.     Left lower leg: No edema.  Lymphadenopathy:     Head:     Right side of head: No submental, submandibular, tonsillar, preauricular or posterior auricular adenopathy.     Left side of head: No submental, submandibular, tonsillar, preauricular or posterior auricular adenopathy.     Cervical: No cervical adenopathy.  Skin:    General: Skin is warm and dry.     Capillary Refill: Capillary refill takes less than 2 seconds.     Findings:  No rash.  Neurological:     Mental Status: He is alert and oriented to person, place, and time.     Cranial Nerves: Cranial nerves are intact.     Gait: Gait is intact.     Deep Tendon Reflexes: Reflexes are normal and symmetric.     Reflex Scores:      Brachioradialis reflexes are 2+ on the right side and 2+ on the left side.      Patellar reflexes are 2+ on the right side and 2+ on the left  side. Psychiatric:        Attention and Perception: Attention normal.        Mood and Affect: Mood normal.        Speech: Speech normal.        Behavior: Behavior normal. Behavior is cooperative.        Thought Content: Thought content normal.        Cognition and Memory: Cognition normal.        Judgment: Judgment normal.    Results for orders placed or performed in visit on 04/06/19  Comprehensive metabolic panel  Result Value Ref Range   Glucose 89 65 - 99 mg/dL   BUN 8 6 - 20 mg/dL   Creatinine, Ser 1.04 0.76 - 1.27 mg/dL   GFR calc non Af Amer 93 >59 mL/min/1.73   GFR calc Af Amer 108 >59 mL/min/1.73   BUN/Creatinine Ratio 8 (L) 9 - 20   Sodium 139 134 - 144 mmol/L   Potassium 4.3 3.5 - 5.2 mmol/L   Chloride 100 96 - 106 mmol/L   CO2 22 20 - 29 mmol/L   Calcium 9.6 8.7 - 10.2 mg/dL   Total Protein 7.2 6.0 - 8.5 g/dL   Albumin 4.7 4.0 - 5.0 g/dL   Globulin, Total 2.5 1.5 - 4.5 g/dL   Albumin/Globulin Ratio 1.9 1.2 - 2.2   Bilirubin Total 0.9 0.0 - 1.2 mg/dL   Alkaline Phosphatase 105 39 - 117 IU/L   AST 58 (H) 0 - 40 IU/L   ALT 65 (H) 0 - 44 IU/L  Lipid Panel w/o Chol/HDL Ratio  Result Value Ref Range   Cholesterol, Total 255 (H) 100 - 199 mg/dL   Triglycerides 432 (H) 0 - 149 mg/dL   HDL 32 (L) >39 mg/dL   VLDL Cholesterol Cal 80 (H) 5 - 40 mg/dL   LDL Chol Calc (NIH) 143 (H) 0 - 99 mg/dL  TSH  Result Value Ref Range   TSH 0.760 0.450 - 4.500 uIU/mL      Assessment & Plan:   Problem List Items Addressed This Visit      Cardiovascular and Mediastinum   Hypertension    Chronic, ongoing with BP close to goal today.  Will continue current medication regimen and adjust as needed.  Check BP at home at least 3 mornings a week.  Continue cutting back on alcohol use.  Return in 6 months for follow-up.  BMP, TSH, and CBC today.      Relevant Medications   metoprolol succinate (TOPROL-XL) 25 MG 24 hr tablet   lisinopril (ZESTRIL) 40 MG tablet   Other Relevant Orders    CBC with Differential/Platelet   Comprehensive metabolic panel   TSH     Digestive   GERD (gastroesophageal reflux disease)    Chronic, stable.  Continue Nexium which offers him benefit and obtain Mag level annually.      Relevant Medications   esomeprazole (NEXIUM) 20 MG  capsule   Other Relevant Orders   Magnesium     Other   Elevated transaminase level    Check CMP today and recommend continue to cut back on alcohol.      Mixed hyperlipidemia    Chronic, ongoing.  Not taking Lopid as recommended.  Recheck cholesterol panel today fasting. Continue to support on alcohol cessation journey.  Recommend continued focus on diet and exercise.      Relevant Medications   metoprolol succinate (TOPROL-XL) 25 MG 24 hr tablet   lisinopril (ZESTRIL) 40 MG tablet   Other Relevant Orders   Lipid Panel w/o Chol/HDL Ratio   Anxiety and depression    Chronic, ongoing.  Is minimally using Klonopin now that is cutting back on alcohol -- last filled October and has 11 pills left.  Continue minimal use of this.  Pt is aware of risks of benzo medication use to include increased sedation, respiratory suppression, falls, dependence and cardiovascular events.  Pt would like to continue treatment as benefit determined to outweigh risk.  UDS and contract next visit.  Continue to recommend alternate medications and support on alcohol cessation.       Gout    Chronic, stable with Allopurinol.  No recent flares.  Check uric acid level today.      Relevant Orders   Uric acid   EtOH dependence ([place])    Praised for continued focus on cessation with cut back approach and working with AA (recommend he return to [facility]), continue to support him on this trajectory.  Could consider addition of Naltrexone in future to assist in reduction.        Obesity    BMI 38.78.  Recommended eating smaller high protein, low fat meals more frequently and exercising 30 mins a day 5 times a week with a goal of 10-15lb  weight loss in the next 3 months. Patient voiced their understanding and motivation to adhere to these recommendations.        Other Visit Diagnoses    Encounter for annual physical exam    -  Primary      Discussed aspirin prophylaxis for myocardial infarction prevention and decision was it was not indicated  LABORATORY TESTING:  Health maintenance labs ordered today as discussed above.   IMMUNIZATIONS:   - Tdap: Tetanus vaccination status reviewed: up to date - Influenza: Up to date - Pneumovax: Not applicable - Prevnar: Not applicable - Zostavax vaccine: Not applicable  SCREENING: - Colonoscopy: Not applicable  Discussed with patient purpose of the colonoscopy is to detect colon cancer at curable precancerous or early stages   - AAA Screening: Not applicable  -Hearing Test: Not applicable  -Spirometry: Not applicable   PATIENT COUNSELING:    Sexuality: Discussed sexually transmitted diseases, partner selection, use of condoms, avoidance of unintended pregnancy  and contraceptive alternatives.   Advised to avoid cigarette smoking.  I discussed with the patient that most people either abstain from alcohol or drink within safe limits (<=14/week and <=4 drinks/occasion for males, <=7/weeks and <= 3 drinks/occasion for females) and that the risk for alcohol disorders and other health effects rises proportionally with the number of drinks per week and how often a drinker exceeds daily limits.  Discussed cessation/primary prevention of drug use and availability of treatment for abuse.   Diet: Encouraged to adjust caloric intake to maintain  or achieve ideal body weight, to reduce intake of dietary saturated fat and total fat, to limit sodium intake by avoiding  high sodium foods and not adding table salt, and to maintain adequate dietary potassium and calcium preferably from fresh fruits, vegetables, and low-fat dairy products.    stressed the importance of regular  exercise  Injury prevention: Discussed safety belts, safety helmets, smoke detector, smoking near bedding or upholstery.   Dental health: Discussed importance of regular tooth brushing, flossing, and dental visits.   Follow up plan: NEXT PREVENTATIVE PHYSICAL DUE IN 1 YEAR. Return in about 6 months (around 02/07/2020) for HTN/HLD, Alcohol cessation, Mood.

## 2019-08-08 NOTE — Assessment & Plan Note (Signed)
Chronic, ongoing with BP close to goal today.  Will continue current medication regimen and adjust as needed.  Check BP at home at least 3 mornings a week.  Continue cutting back on alcohol use.  Return in 6 months for follow-up.  BMP, TSH, and CBC today.

## 2019-08-08 NOTE — Assessment & Plan Note (Signed)
BMI 38.78.  Recommended eating smaller high protein, low fat meals more frequently and exercising 30 mins a day 5 times a week with a goal of 10-15lb weight loss in the next 3 months. Patient voiced their understanding and motivation to adhere to these recommendations.

## 2019-08-08 NOTE — Assessment & Plan Note (Signed)
Chronic, ongoing.  Is minimally using Klonopin now that is cutting back on alcohol -- last filled October and has 11 pills left.  Continue minimal use of this.  Pt is aware of risks of benzo medication use to include increased sedation, respiratory suppression, falls, dependence and cardiovascular events.  Pt would like to continue treatment as benefit determined to outweigh risk.  UDS and contract next visit.  Continue to recommend alternate medications and support on alcohol cessation.

## 2019-08-08 NOTE — Assessment & Plan Note (Signed)
Praised for continued focus on cessation with cut back approach and working with AA (recommend he return to [facility]), continue to support him on this trajectory.  Could consider addition of Naltrexone in future to assist in reduction.

## 2019-08-08 NOTE — Assessment & Plan Note (Signed)
Chronic, ongoing.  Not taking Lopid as recommended.  Recheck cholesterol panel today fasting. Continue to support on alcohol cessation journey.  Recommend continued focus on diet and exercise.

## 2019-08-08 NOTE — Patient Instructions (Signed)
DASH Eating Plan DASH stands for "Dietary Approaches to Stop Hypertension." The DASH eating plan is a healthy eating plan that has been shown to reduce high blood pressure (hypertension). It may also reduce your risk for type 2 diabetes, heart disease, and stroke. The DASH eating plan may also help with weight loss. What are tips for following this plan?  General guidelines  Avoid eating more than 2,300 mg (milligrams) of salt (sodium) a day. If you have hypertension, you may need to reduce your sodium intake to 1,500 mg a day.  Limit alcohol intake to no more than 1 drink a day for nonpregnant women and 2 drinks a day for men. One drink equals 12 oz of beer, 5 oz of wine, or 1 oz of hard liquor.  Work with your health care provider to maintain a healthy body weight or to lose weight. Ask what an ideal weight is for you.  Get at least 30 minutes of exercise that causes your heart to beat faster (aerobic exercise) most days of the week. Activities may include walking, swimming, or biking.  Work with your health care provider or diet and nutrition specialist (dietitian) to adjust your eating plan to your individual calorie needs. Reading food labels   Check food labels for the amount of sodium per serving. Choose foods with less than 5 percent of the Daily Value of sodium. Generally, foods with less than 300 mg of sodium per serving fit into this eating plan.  To find whole grains, look for the word "whole" as the first word in the ingredient list. Shopping  Buy products labeled as "low-sodium" or "no salt added."  Buy fresh foods. Avoid canned foods and premade or frozen meals. Cooking  Avoid adding salt when cooking. Use salt-free seasonings or herbs instead of table salt or sea salt. Check with your health care provider or pharmacist before using salt substitutes.  Do not fry foods. Cook foods using healthy methods such as baking, boiling, grilling, and broiling instead.  Cook with  heart-healthy oils, such as olive, canola, soybean, or sunflower oil. Meal planning  Eat a balanced diet that includes: ? 5 or more servings of fruits and vegetables each day. At each meal, try to fill half of your plate with fruits and vegetables. ? Up to 6-8 servings of whole grains each day. ? Less than 6 oz of lean meat, poultry, or fish each day. A 3-oz serving of meat is about the same size as a deck of cards. One egg equals 1 oz. ? 2 servings of low-fat dairy each day. ? A serving of nuts, seeds, or beans 5 times each week. ? Heart-healthy fats. Healthy fats called Omega-3 fatty acids are found in foods such as flaxseeds and coldwater fish, like sardines, salmon, and mackerel.  Limit how much you eat of the following: ? Canned or prepackaged foods. ? Food that is high in trans fat, such as fried foods. ? Food that is high in saturated fat, such as fatty meat. ? Sweets, desserts, sugary drinks, and other foods with added sugar. ? Full-fat dairy products.  Do not salt foods before eating.  Try to eat at least 2 vegetarian meals each week.  Eat more home-cooked food and less restaurant, buffet, and fast food.  When eating at a restaurant, ask that your food be prepared with less salt or no salt, if possible. What foods are recommended? The items listed may not be a complete list. Talk with your dietitian about   what dietary choices are best for you. Grains Whole-grain or whole-wheat bread. Whole-grain or whole-wheat pasta. Brown rice. Oatmeal. Quinoa. Bulgur. Whole-grain and low-sodium cereals. Pita bread. Low-fat, low-sodium crackers. Whole-wheat flour tortillas. Vegetables Fresh or frozen vegetables (raw, steamed, roasted, or grilled). Low-sodium or reduced-sodium tomato and vegetable juice. Low-sodium or reduced-sodium tomato sauce and tomato paste. Low-sodium or reduced-sodium canned vegetables. Fruits All fresh, dried, or frozen fruit. Canned fruit in natural juice (without  added sugar). Meat and other protein foods Skinless chicken or turkey. Ground chicken or turkey. Pork with fat trimmed off. Fish and seafood. Egg whites. Dried beans, peas, or lentils. Unsalted nuts, nut butters, and seeds. Unsalted canned beans. Lean cuts of beef with fat trimmed off. Low-sodium, lean deli meat. Dairy Low-fat (1%) or fat-free (skim) milk. Fat-free, low-fat, or reduced-fat cheeses. Nonfat, low-sodium ricotta or cottage cheese. Low-fat or nonfat yogurt. Low-fat, low-sodium cheese. Fats and oils Soft margarine without trans fats. Vegetable oil. Low-fat, reduced-fat, or light mayonnaise and salad dressings (reduced-sodium). Canola, safflower, olive, soybean, and sunflower oils. Avocado. Seasoning and other foods Herbs. Spices. Seasoning mixes without salt. Unsalted popcorn and pretzels. Fat-free sweets. What foods are not recommended? The items listed may not be a complete list. Talk with your dietitian about what dietary choices are best for you. Grains Baked goods made with fat, such as croissants, muffins, or some breads. Dry pasta or rice meal packs. Vegetables Creamed or fried vegetables. Vegetables in a cheese sauce. Regular canned vegetables (not low-sodium or reduced-sodium). Regular canned tomato sauce and paste (not low-sodium or reduced-sodium). Regular tomato and vegetable juice (not low-sodium or reduced-sodium). Pickles. Olives. Fruits Canned fruit in a light or heavy syrup. Fried fruit. Fruit in cream or butter sauce. Meat and other protein foods Fatty cuts of meat. Ribs. Fried meat. Bacon. Sausage. Bologna and other processed lunch meats. Salami. Fatback. Hotdogs. Bratwurst. Salted nuts and seeds. Canned beans with added salt. Canned or smoked fish. Whole eggs or egg yolks. Chicken or turkey with skin. Dairy Whole or 2% milk, cream, and half-and-half. Whole or full-fat cream cheese. Whole-fat or sweetened yogurt. Full-fat cheese. Nondairy creamers. Whipped toppings.  Processed cheese and cheese spreads. Fats and oils Butter. Stick margarine. Lard. Shortening. Ghee. Bacon fat. Tropical oils, such as coconut, palm kernel, or palm oil. Seasoning and other foods Salted popcorn and pretzels. Onion salt, garlic salt, seasoned salt, table salt, and sea salt. Worcestershire sauce. Tartar sauce. Barbecue sauce. Teriyaki sauce. Soy sauce, including reduced-sodium. Steak sauce. Canned and packaged gravies. Fish sauce. Oyster sauce. Cocktail sauce. Horseradish that you find on the shelf. Ketchup. Mustard. Meat flavorings and tenderizers. Bouillon cubes. Hot sauce and Tabasco sauce. Premade or packaged marinades. Premade or packaged taco seasonings. Relishes. Regular salad dressings. Where to find more information:  National Heart, Lung, and Blood Institute: www.nhlbi.nih.gov  American Heart Association: www.heart.org Summary  The DASH eating plan is a healthy eating plan that has been shown to reduce high blood pressure (hypertension). It may also reduce your risk for type 2 diabetes, heart disease, and stroke.  With the DASH eating plan, you should limit salt (sodium) intake to 2,300 mg a day. If you have hypertension, you may need to reduce your sodium intake to 1,500 mg a day.  When on the DASH eating plan, aim to eat more fresh fruits and vegetables, whole grains, lean proteins, low-fat dairy, and heart-healthy fats.  Work with your health care provider or diet and nutrition specialist (dietitian) to adjust your eating plan to your   individual calorie needs. This information is not intended to replace advice given to you by your health care provider. Make sure you discuss any questions you have with your health care provider. Document Revised: 01/28/2017 Document Reviewed: 02/09/2016 Elsevier Patient Education  2020 Elsevier Inc.  

## 2019-08-09 LAB — URIC ACID: Uric Acid: 4.3 mg/dL (ref 3.8–8.4)

## 2019-08-09 LAB — COMPREHENSIVE METABOLIC PANEL
ALT: 86 IU/L — ABNORMAL HIGH (ref 0–44)
AST: 65 IU/L — ABNORMAL HIGH (ref 0–40)
Albumin/Globulin Ratio: 2 (ref 1.2–2.2)
Albumin: 4.9 g/dL (ref 4.0–5.0)
Alkaline Phosphatase: 93 IU/L (ref 48–121)
BUN/Creatinine Ratio: 9 (ref 9–20)
BUN: 9 mg/dL (ref 6–20)
Bilirubin Total: 0.5 mg/dL (ref 0.0–1.2)
CO2: 24 mmol/L (ref 20–29)
Calcium: 9.7 mg/dL (ref 8.7–10.2)
Chloride: 97 mmol/L (ref 96–106)
Creatinine, Ser: 1.02 mg/dL (ref 0.76–1.27)
GFR calc Af Amer: 110 mL/min/{1.73_m2} (ref >59)
GFR calc non Af Amer: 95 mL/min/{1.73_m2} (ref >59)
Globulin, Total: 2.4 g/dL (ref 1.5–4.5)
Glucose: 83 mg/dL (ref 65–99)
Potassium: 5.2 mmol/L (ref 3.5–5.2)
Sodium: 136 mmol/L (ref 134–144)
Total Protein: 7.3 g/dL (ref 6.0–8.5)

## 2019-08-09 LAB — CBC WITH DIFFERENTIAL/PLATELET
Basophils Absolute: 0 10*3/uL (ref 0.0–0.2)
Basos: 0 %
EOS (ABSOLUTE): 0.1 10*3/uL (ref 0.0–0.4)
Eos: 1 %
Hematocrit: 50.6 % (ref 37.5–51.0)
Hemoglobin: 17.6 g/dL (ref 13.0–17.7)
Immature Grans (Abs): 0 10*3/uL (ref 0.0–0.1)
Immature Granulocytes: 0 %
Lymphocytes Absolute: 1.8 10*3/uL (ref 0.7–3.1)
Lymphs: 32 %
MCH: 34.1 pg — ABNORMAL HIGH (ref 26.6–33.0)
MCHC: 34.8 g/dL (ref 31.5–35.7)
MCV: 98 fL — ABNORMAL HIGH (ref 79–97)
Monocytes Absolute: 0.4 10*3/uL (ref 0.1–0.9)
Monocytes: 8 %
Neutrophils Absolute: 3.2 10*3/uL (ref 1.4–7.0)
Neutrophils: 59 %
Platelets: 176 10*3/uL (ref 150–450)
RBC: 5.16 x10E6/uL (ref 4.14–5.80)
RDW: 12.8 % (ref 11.6–15.4)
WBC: 5.5 10*3/uL (ref 3.4–10.8)

## 2019-08-09 LAB — MAGNESIUM: Magnesium: 2 mg/dL (ref 1.6–2.3)

## 2019-08-09 LAB — LIPID PANEL W/O CHOL/HDL RATIO
Cholesterol, Total: 249 mg/dL — ABNORMAL HIGH (ref 100–199)
HDL: 28 mg/dL — ABNORMAL LOW (ref >39)
LDL Chol Calc (NIH): 172 mg/dL — ABNORMAL HIGH (ref 0–99)
Triglycerides: 255 mg/dL — ABNORMAL HIGH (ref 0–149)
VLDL Cholesterol Cal: 49 mg/dL — ABNORMAL HIGH (ref 5–40)

## 2019-08-09 LAB — TSH: TSH: 1.21 u[IU]/mL (ref 0.450–4.500)

## 2019-08-09 NOTE — Progress Notes (Signed)
Contacted via [place] afternoon Ken[place]ith your labs have returned.   - Cholesterol levels remain elevated, including triglycerides, I do recommend for heart protection you restart Lopid.  Do you have any of this at home?  Are you okay with restarting?  Let me know. - Liver function tests remain mildly elevated, I do recommend continuing to cut back on alcohol intake which will benefit liver health overall.  We will continue to monitor.  Remainder of labs look good. Keep being awesome!! Kindest regards, Henrine Screws  '

## 2019-09-12 ENCOUNTER — Ambulatory Visit: Payer: Self-pay

## 2019-09-12 NOTE — Chronic Care Management (AMB) (Signed)
  Care Management   Follow Up Note   09/12/2019 Name: [name]. MRN: [id number] DOB: [birth year]  Referred by: Venita Lick, NP Reason for referral : Care Coordination   [name]. is a 34 y.o. year old male who is a primary care patient of Cannady, Barbaraann Faster, NP. The care management team was consulted for assistance with care management and care coordination needs.    Review of patient status, including review of consultants reports, relevant laboratory and other test results, and collaboration with appropriate care team members and the patient's provider was performed as part of comprehensive patient evaluation and provision of chronic care management services.    LCSW completed CCM outreach attempt today but was unable to reach patient successfully. A HIPPA compliant voice message was left encouraging patient to return call once available. LCSW rescheduled CCM SW appointment as well.  A HIPPA compliant phone message was left for the patient providing contact information and requesting a return call.   Eula Fried, BSW, MSW, [facility] Care Management [email or web address] Phone: [phone]

## 2019-12-03 ENCOUNTER — Telehealth: Payer: Self-pay

## 2020-01-16 ENCOUNTER — Ambulatory Visit: Payer: Commercial Managed Care - PPO | Admitting: Nurse Practitioner

## 2020-02-07 ENCOUNTER — Ambulatory Visit: Payer: Commercial Managed Care - PPO | Admitting: Nurse Practitioner

## 2020-03-27 ENCOUNTER — Emergency Department
Admission: EM | Admit: 2020-03-27 | Discharge: 2020-03-27 | Disposition: A | Discharge status: Home / Self Care | Payer: BC Managed Care – PPO | Attending: Emergency Medicine | Admitting: Emergency Medicine

## 2020-03-27 ENCOUNTER — Encounter: Payer: Self-pay | Admitting: Emergency Medicine

## 2020-03-27 ENCOUNTER — Emergency Department: Payer: BC Managed Care – PPO

## 2020-03-27 ENCOUNTER — Other Ambulatory Visit: Payer: Self-pay

## 2020-03-27 DIAGNOSIS — Z79899 Other long term (current) drug therapy: Secondary | ICD-10-CM | POA: Diagnosis not present

## 2020-03-27 DIAGNOSIS — I10 Essential (primary) hypertension: Secondary | ICD-10-CM | POA: Diagnosis not present

## 2020-03-27 DIAGNOSIS — R079 Chest pain, unspecified: Secondary | ICD-10-CM | POA: Diagnosis not present

## 2020-03-27 DIAGNOSIS — K709 Alcoholic liver disease, unspecified: Secondary | ICD-10-CM | POA: Diagnosis not present

## 2020-03-27 LAB — HEPATIC FUNCTION PANEL
ALT: 152 U/L — ABNORMAL HIGH (ref 0–44)
AST: 250 U/L — ABNORMAL HIGH (ref 15–41)
Albumin: 4.2 g/dL (ref 3.5–5.0)
Alkaline Phosphatase: 87 U/L (ref 38–126)
Bilirubin, Direct: 0.3 mg/dL — ABNORMAL HIGH (ref 0.0–0.2)
Indirect Bilirubin: 0.8 mg/dL (ref 0.3–0.9)
Total Bilirubin: 1.1 mg/dL (ref 0.3–1.2)
Total Protein: 7.9 g/dL (ref 6.5–8.1)

## 2020-03-27 LAB — CBC
HCT: 43.3 % (ref 39.0–52.0)
Hemoglobin: 15.6 g/dL (ref 13.0–17.0)
MCH: 35.1 pg — ABNORMAL HIGH (ref 26.0–34.0)
MCHC: 36 g/dL (ref 30.0–36.0)
MCV: 97.3 fL (ref 80.0–100.0)
Platelets: 144 10*3/uL — ABNORMAL LOW (ref 150–400)
RBC: 4.45 MIL/uL (ref 4.22–5.81)
RDW: 12.1 % (ref 11.5–15.5)
WBC: 10.8 10*3/uL — ABNORMAL HIGH (ref 4.0–10.5)
nRBC: 0 % (ref 0.0–0.2)

## 2020-03-27 LAB — BASIC METABOLIC PANEL
Anion gap: 11 (ref 5–15)
BUN: 8 mg/dL (ref 6–20)
CO2: 23 mmol/L (ref 22–32)
Calcium: 9.3 mg/dL (ref 8.9–10.3)
Chloride: 100 mmol/L (ref 98–111)
Creatinine, Ser: 0.83 mg/dL (ref 0.61–1.24)
GFR, Estimated: >60 mL/min (ref >60)
Glucose, Bld: 125 mg/dL — ABNORMAL HIGH (ref 70–99)
Potassium: 4.3 mmol/L (ref 3.5–5.1)
Sodium: 134 mmol/L — ABNORMAL LOW (ref 135–145)

## 2020-03-27 LAB — TROPONIN I (HIGH SENSITIVITY)
Troponin I (High Sensitivity): 5 ng/L (ref <18)
Troponin I (High Sensitivity): 4 ng/L (ref <18)

## 2020-03-27 NOTE — ED Notes (Signed)
Dr. Jari Pigg at bedside in triage.

## 2020-03-27 NOTE — Discharge Instructions (Signed)
Your cardiac work-up was negative. Your liver enzymes were slightly elevated. Cut down alcohol use. Return the ER if you develop worsening symptoms

## 2020-03-27 NOTE — ED Notes (Signed)
Pt calm , collective , denied pain or sob  

## 2020-03-27 NOTE — ED Provider Notes (Signed)
[facility] Emergency Department Provider Note  ____________________________________________   None    (approximate)  I have reviewed the triage vital signs and the nursing notes.   HISTORY  Chief Complaint Chest Pain    HPI [name]. is a 35 y.o. male with anxiety who comes in with chest pain. Pt at work around [name] develop sudden onset chest pain, tachycardia, sob, and not feeling well. Took klonopin without relief in symptoms. Normally klonopin helps. Denies symptoms now feeling better but wanted to get things checked out. He symptoms were moderate, constant, not better with Klonopin, unclear what brought it on. States he is never had it happen at work before. Currently he is feeling better but stated he just wanted, make sure everything looked okay since he was already on his way          Past Medical History:  Diagnosis Date  . ADHD (attention deficit hyperactivity disorder)   . Anxiety   . Bipolar 1 disorder ([name])   . Bipolar 1 disorder ([name]) 08/19/2014  . Chronic back pain   . Depression   . GERD (gastroesophageal reflux disease)   . Gout   . Hyperlipidemia     Patient Active Problem List   Diagnosis Date Noted  . Obesity 07/11/2019  . EtOH dependence ([name]) 03/21/2015  . Insomnia 03/21/2015  . Hypertension 03/21/2015  . Elevated transaminase level 08/19/2014  . Mixed hyperlipidemia 08/19/2014  . Chronic back pain 08/19/2014  . Anxiety and depression 08/19/2014  . ADHD (attention deficit hyperactivity disorder) 08/19/2014  . GERD (gastroesophageal reflux disease) 08/19/2014  . Gout 08/19/2014  . Urolithiasis 09/12/2012    Past Surgical History:  Procedure Laterality Date  . APPENDECTOMY    . KIDNEY STONE SURGERY      Prior to Admission medications   Medication Sig Start Date End Date Taking? Authorizing Provider  albuterol (VENTOLIN HFA) 108 (90 Base) MCG/ACT inhaler Inhale 2 puffs into the lungs every 6 (six) hours  as needed for wheezing or shortness of breath. Patient not taking: Reported on 07/11/2019 02/26/19   [name] T, NP  allopurinol (ZYLOPRIM) 300 MG tablet TAKE 1 TABLET BY MOUTH EVERY DAY 04/23/19   Cannady, Nathan Screws T, NP  clonazePAM (KLONOPIN) 0.5 MG tablet Take 1 tablet (0.5 mg total) by mouth 2 (two) times daily as needed for anxiety. 10/25/18   Cannady, Nathan Screws T, NP  colchicine 0.6 MG tablet Take 0.6 mg by mouth daily as needed.     [provider]  esomeprazole (NEXIUM) 20 MG capsule Take 20 mg by mouth daily at 12 noon.    [provider]  lisinopril (ZESTRIL) 40 MG tablet Take 1 tablet (40 mg total) by mouth daily. 08/08/19   Cannady, Nathan Screws T, NP  metoprolol succinate (TOPROL-XL) 25 MG 24 hr tablet Take 1 tablet (25 mg total) by mouth daily. 08/08/19   [name] T, NP    Allergies Cyclobenzaprine, Penicillin g benzathine, and Amoxicillin  Family History  Problem Relation Age of Onset  . Hyperlipidemia Father   . Cancer Paternal Grandmother        pancreatic    Social History Social History   Tobacco Use  . Smoking status: Never Smoker  . Smokeless tobacco: Current User    Types: Chew  Vaping Use  . Vaping Use: Never used  Substance Use Topics  . Alcohol use: Yes    Comment: 1 pint of liquor  . Drug use: No  Review of Systems Constitutional: No fever/chills Eyes: No visual changes. ENT: No sore throat. Cardiovascular: Positive chest pain Respiratory: + sob  Gastrointestinal: No abdominal pain.  No nausea, no vomiting.  No diarrhea.  No constipation. Genitourinary: Negative for dysuria. Musculoskeletal: Negative for back pain. Skin: Negative for rash. Neurological: Negative for headaches, focal weakness or numbness. +anxiety  All other ROS negative ____________________________________________   PHYSICAL EXAM:  VITAL SIGNS: ED Triage Vitals [03/27/20 1005]  Enc Vitals Group     BP (!) 140/93     Pulse Rate (!) 108     Resp 16      Temp 98.7 F (37.1 C)     Temp Source Oral     SpO2 96 %     Weight 230 lb (104.3 kg)     Height 5\' 5"  (1.651 m)     Head Circumference      Peak Flow      Pain Score 4     Pain Loc      Pain Edu?      Excl. in [name]?     Constitutional: Alert and oriented. Well appearing and in no acute distress. Eyes: Conjunctivae are normal. EOMI. Head: Atraumatic. Nose: No congestion/rhinnorhea. Mouth/Throat: Mucous membranes are moist.   Neck: No stridor. Trachea Midline. FROM Cardiovascular: Normal rate, regular rhythm. Grossly normal heart sounds.  Good peripheral circulation. Respiratory: Normal respiratory effort.  No retractions. Lungs CTAB. Gastrointestinal: Soft and nontender. No distention. No abdominal bruits.  Musculoskeletal: No lower extremity tenderness nor edema.  No joint effusions. Neurologic:  Normal speech and language. No gross focal neurologic deficits are appreciated.  Skin:  Skin is warm, dry and intact. No rash noted. Psychiatric: Mood and affect are normal. Speech and behavior are normal. GU: Deferred   ____________________________________________   LABS (all labs ordered are listed, but only abnormal results are displayed)  Labs Reviewed  BASIC METABOLIC PANEL - Abnormal; Notable for the following components:      Result Value   Sodium 134 (*)    Glucose, Bld 125 (*)    All other components within normal limits  CBC - Abnormal; Notable for the following components:   WBC 10.8 (*)    MCH 35.1 (*)    Platelets 144 (*)    All other components within normal limits  HEPATIC FUNCTION PANEL - Abnormal; Notable for the following components:   AST 250 (*)    ALT 152 (*)    Bilirubin, Direct 0.3 (*)    All other components within normal limits  TROPONIN I (HIGH SENSITIVITY)  TROPONIN I (HIGH SENSITIVITY)   ____________________________________________   ED ECG REPORT I, [name], the attending physician, personally viewed and interpreted this  ECG.  Normal sinus no st elevation, no twi normal intervals  ____________________________________________  RADIOLOGY [name], personally viewed and evaluated these images (plain radiographs) as part of my medical decision making, as well as reviewing the written report by the radiologist.  ED MD interpretation: No pneumonia  Official radiology report(s): DG Chest 2 View  Result Date: 03/27/2020 CLINICAL DATA:  Chest pain and shortness of breath EXAM: CHEST - 2 VIEW COMPARISON:  None. FINDINGS: The lungs are clear. The heart size and pulmonary vascularity are normal. No adenopathy. No pneumothorax. No bone lesions. IMPRESSION: Lungs clear.  Cardiac silhouette normal. Electronically Signed   By: Lowella Grip III M.D.   On: 03/27/2020 10:39    ____________________________________________   PROCEDURES  Procedure(s) performed (including  Critical Care):  Procedures   ____________________________________________   INITIAL IMPRESSION / ASSESSMENT AND PLAN / ED COURSE   [name] was evaluated in Emergency Department on 03/27/2020 for the symptoms described in the history of present illness. He was evaluated in the context of the global COVID-19 pandemic, which necessitated consideration that the patient might be at risk for infection with the SARS-CoV-2 virus that causes COVID-19. Institutional protocols and algorithms that pertain to the evaluation of patients at risk for COVID-19 are in a state of rapid change based on information released by regulatory bodies including the CDC and federal and state organizations. These policies and algorithms were followed during the patient's care in the ED.    Most Likely DDx:  -MSK (atypical chest pain) versus possible anxiety but will get cardiac markers to evaluate for ACS given risk factors/age       DDx that was also considered d/[name] potential to cause harm, but was found less likely based on history and physical (as  detailed above): -PNA (no fevers, cough but CXR to evaluate) -PNX (reassured with equal b/l breath sounds, CXR to evaluate) -Symptomatic anemia (will get H&H) -Pulmonary embolism as no sob at rest, not pleuritic in nature, no hypoxia and no pain at this time -Aortic Dissection as no tearing pain and no radiation to the mid back, pulses equal -Pericarditis no rub on exam, EKG changes or hx to suggest dx -Tamponade (no notable SOB, tachycardic, hypotensive) -Esophageal rupture (no h/o diffuse vomitting/no crepitus)   LFTS elevated in 2:1 fashion . Pt does admit alcohol use. No RUQ pain to suggest gallbladder issue.  Currently asymptomatic.  Pt understands importance of cutting down on drinking.   Cardiac markers are negative x2 given the onset of his pain. He is currently asymptomatic and is feeling at his baseline self. We discussed cutting down on alcohol use and return to ER if he develops worsening pain but at this time he feels comfortable with discharge home        ____________________________________________   FINAL CLINICAL IMPRESSION(S) / ED DIAGNOSES   Final diagnoses:  Chest pain, unspecified type  Alcoholic liver damage (HCC)     MEDICATIONS GIVEN DURING THIS VISIT:  Medications - No data to display   ED Discharge Orders    None       Note:  This document was prepared using Dragon voice recognition software and may include unintentional dictation errors.   Nathan [place], MD 03/27/20 1328

## 2020-03-27 NOTE — ED Triage Notes (Signed)
Pt comes into the ED via POV c/o central chest pain that radiated into the right arm.  Pt states he has a feeling that his heart was racing, SHOB, and dizziness.  Pt states he did take his clonazepam thinking it was anxiety related but it took an hr to work and he states that's not normal.  Pt currently is not diaphoretic, has even and unlabored respiration and is ambulatory to triage.  Pt in [facility].

## 2020-03-31 ENCOUNTER — Other Ambulatory Visit: Payer: Self-pay | Admitting: Nurse Practitioner

## 2020-03-31 MED ORDER — ESOMEPRAZOLE MAGNESIUM 40 MG PO CPDR: 40.0000 mg | DELAYED_RELEASE_CAPSULE | Freq: Every day | ORAL | 4 refills | Status: DC

## 2020-04-22 ENCOUNTER — Other Ambulatory Visit: Payer: Self-pay | Admitting: Nurse Practitioner

## 2020-06-18 DIAGNOSIS — E6609 Other obesity due to excess calories: Secondary | ICD-10-CM

## 2020-06-18 DIAGNOSIS — R0683 Snoring: Secondary | ICD-10-CM

## 2020-06-18 DIAGNOSIS — F10282 Alcohol dependence with alcohol-induced sleep disorder: Secondary | ICD-10-CM

## 2020-06-18 DIAGNOSIS — Z6838 Body mass index (BMI) 38.0-38.9, adult: Secondary | ICD-10-CM

## 2020-06-24 ENCOUNTER — Ambulatory Visit: Payer: BC Managed Care – PPO | Admitting: Nurse Practitioner

## 2020-06-24 ENCOUNTER — Other Ambulatory Visit: Payer: Self-pay

## 2020-06-24 ENCOUNTER — Encounter: Payer: Self-pay | Admitting: Nurse Practitioner

## 2020-06-24 VITALS — BP 121/79 | HR 69 | Temp 98.6°F | Wt 236.6 lb

## 2020-06-24 DIAGNOSIS — M1A0710 Idiopathic chronic gout, right ankle and foot, without tophus (tophi): Secondary | ICD-10-CM

## 2020-06-24 DIAGNOSIS — F5104 Psychophysiologic insomnia: Secondary | ICD-10-CM

## 2020-06-24 DIAGNOSIS — F419 Anxiety disorder, unspecified: Secondary | ICD-10-CM

## 2020-06-24 DIAGNOSIS — K219 Gastro-esophageal reflux disease without esophagitis: Secondary | ICD-10-CM

## 2020-06-24 DIAGNOSIS — E782 Mixed hyperlipidemia: Secondary | ICD-10-CM | POA: Diagnosis not present

## 2020-06-24 DIAGNOSIS — E6609 Other obesity due to excess calories: Secondary | ICD-10-CM

## 2020-06-24 DIAGNOSIS — R7401 Elevation of levels of liver transaminase levels: Secondary | ICD-10-CM | POA: Diagnosis not present

## 2020-06-24 DIAGNOSIS — I10 Essential (primary) hypertension: Secondary | ICD-10-CM | POA: Diagnosis not present

## 2020-06-24 DIAGNOSIS — F10282 Alcohol dependence with alcohol-induced sleep disorder: Secondary | ICD-10-CM

## 2020-06-24 DIAGNOSIS — Z6838 Body mass index (BMI) 38.0-38.9, adult: Secondary | ICD-10-CM

## 2020-06-24 DIAGNOSIS — F32A Depression, unspecified: Secondary | ICD-10-CM

## 2020-06-24 MED ORDER — LISINOPRIL 40 MG PO TABS: 40.0000 mg | ORAL_TABLET | Freq: Every day | ORAL | 4 refills | Status: DC

## 2020-06-24 MED ORDER — ALLOPURINOL 300 MG PO TABS: 300.0000 mg | ORAL_TABLET | Freq: Every day | ORAL | 4 refills | Status: DC

## 2020-06-24 MED ORDER — METOPROLOL SUCCINATE ER 25 MG PO TB24: 25.0000 mg | ORAL_TABLET | Freq: Every day | ORAL | 4 refills | Status: DC

## 2020-06-24 NOTE — Assessment & Plan Note (Signed)
Chronic, stable with Allopurinol.  No recent flares.  Check uric acid level today.

## 2020-06-24 NOTE — Assessment & Plan Note (Signed)
Chronic, ongoing with BP at goal on exam today.  Will continue current medication regimen and adjust as needed, refills sent in.  Check BP at home at least 3 mornings a week.  Continue cutting back on alcohol use.  Focus on DASH diet recommended.  Return in 6 months for follow-up.  CMP, TSH, and CBC today.

## 2020-06-24 NOTE — Assessment & Plan Note (Signed)
Referral to neurology in place and will work on sleep study to further evaluate.

## 2020-06-24 NOTE — Progress Notes (Signed)
BP 121/79   Pulse 69   Temp 98.6 F (37 C) (Oral)   Wt 236 lb 9.6 oz (107.3 kg)   SpO2 97%   BMI 39.37 kg/m    Subjective:    Patient ID: Nathan Haws., male    DOB: 1985/11/03, 35 y.o.   MRN: [id number]  HPI: Nathan Staffieri. is a 35 y.o. male  Chief Complaint  Patient presents with  . Hyperlipidemia  . Hypertension  . Mood  . Alcohol Problem   HYPERTENSION / HYPERLIPIDEMIA Continues on Metoprolol XL 25 MG and Lisinopril 40 MG daily -- he had self stopped Lopid last visit and has not restarted on review.   A sleep study referral was placed on 06/18/20 to assess for OSA, as patient reports frequent snoring and difficulty with sleep pattern.  Has history of gout, taking Allopurinol 300 MG, has Colchicine 0.6 MG PRN -- has not had flare in years.   For GERD takes Nexium which he reports offers a lot of benefit, recently was restarted after an ER visit with chest pain presenting.  He reports occasionally noticing a chemical taste on lips around 1 PM every day -- weird chemical taste.  Works around a lot of chemicals, but then noticed this on weekends.  Does dip -- dips pretty much constant.   Satisfied with current treatment? yes Duration of hypertension: chronic BP monitoring frequency: not checking BP range:  BP medication side effects: no Duration of hyperlipidemia: chronic Aspirin: no Recent stressors: no Recurrent headaches: no Visual changes: no Palpitations: no Dyspnea: no Chest pain: no Lower extremity edema: no Dizzy/lightheaded: no  ALCOHOL ABUSE: Prior to last weekend was drinking 1 1/2 bottles wine a night, two weekends ago (Easter weekend) did go on binge, on Monday did no recall what day it was.  Currently drinking a little more then one regular bottle wine per night. Had been going to [employer] groups in person  -- is attending occasional AA meetings.  In January they switched his work hours to night -- threw body out of rhythm.  Recent LFT had trended  upwards AST/ALT  250/152 in ER on 03/27/20, previous was in June 2021 when he had decreased alcohol intake and levels were 65/86.  WBC while in ER noted mild elevation 10.8 and platelet 144, NA+ 134, glucose 125. [place] Visit from 06/24/2020 in [place]  AUDIT-C Score [place] Office Visit from 06/24/2020 in [facility]  Alcohol Use Disorder Identification Test Final Score (AUDIT) 19     DEPRESSION Reports his mood "has been fine". Takes Klonopinminimally.  Refuses to try alternate medications and is aware that Klonopin is only a short term medication and no refills will be placed.  Recent stressors with new grand baby.   Mood status:stable Satisfied with current treatment?:yes Symptom severity:mild Duration of current treatment :chronic Side effects:no Medication compliance:good compliance Psychotherapy/counseling:none Previous psychiatric medications:Klonopin Depressed mood:no Anxious mood:yes Anhedonia:no Significant weight loss or gain:no Insomnia:yeshard to fall asleep Fatigue:no Feelings of worthlessness or guilt:no Impaired concentration/indecisiveness:no Suicidal ideations:no Hopelessness:no Crying spells:no Depression screen [facility] 2/9 06/24/2020 08/08/2019 07/11/2019 02/26/2019 02/05/2019  Decreased Interest 1 0 0 0 0  Down, Depressed, Hopeless 1 0 0 0 0  PHQ - 2 Score 2 0 0 0 0  Altered sleeping 3 0 0 1 3  Tired, decreased energy 3 0 1 1 0  Change in appetite 3 0 0 1 0  Feeling bad  or failure about yourself  2 0 0 0 0  Trouble concentrating 0 0 0 0 0  Moving slowly or fidgety/restless 3 0 0 0 0  Suicidal thoughts 0 0 0 0 0  PHQ-9 Score 16 0 1 3 3   Difficult doing work/chores - Not difficult at all Not difficult at all Not difficult at all Not difficult at all   GAD 7 : Generalized Anxiety Score 06/24/2020 07/11/2019 02/26/2019 10/25/2018  Nervous, Anxious, on Edge 3 3 2 2   Control/stop worrying 3  3 1 2   Worry too much - different things 3 3 1 2   Trouble relaxing 3 1 1 2   Restless 3 0 1 2  Easily annoyed or irritable 1 1 0 2  Afraid - awful might happen 3 3 2 2   Total GAD 7 Score 19 14 8 14   Anxiety Difficulty - Not difficult at all Not difficult at all -    Relevant past medical, surgical, family and social history reviewed and updated as indicated. Interim medical history since our last visit reviewed. Allergies and medications reviewed and updated.  Review of Systems  Constitutional: Negative for activity change, diaphoresis, fatigue and fever.  Respiratory: Negative for cough, chest tightness, shortness of breath and wheezing.   Cardiovascular: Negative for chest pain, palpitations and leg swelling.  Gastrointestinal: Negative.   Skin: Negative.   Neurological: Negative.   Psychiatric/Behavioral: Negative for decreased concentration, self-injury, sleep disturbance and suicidal ideas.    Per HPI unless specifically indicated above     Objective:    BP 121/79   Pulse 69   Temp 98.6 F (37 C) (Oral)   Wt 236 lb 9.6 oz (107.3 kg)   SpO2 97%   BMI 39.37 kg/m   Wt Readings from Last 3 Encounters:  06/24/20 236 lb 9.6 oz (107.3 kg)  03/27/20 230 lb (104.3 kg)  08/08/19 235 lb 3.2 oz (106.7 kg)    Physical Exam Vitals and nursing note reviewed.  Constitutional:      General: He is not in acute distress.    Appearance: He is well-developed and well-groomed. He is obese. He is not ill-appearing.  HENT:     Head: Normocephalic and atraumatic.     Right Ear: Hearing normal. No drainage.     Left Ear: Hearing normal. No drainage.  Eyes:     General: Lids are normal. No scleral icterus.       Right eye: No discharge.        Left eye: No discharge.     Conjunctiva/sclera: Conjunctivae normal.     Pupils: Pupils are equal, round, and reactive to light.  Neck:     Thyroid: No thyromegaly.     Vascular: No carotid bruit.  Cardiovascular:     Rate and Rhythm:  Normal rate and regular rhythm.     Heart sounds: Normal heart sounds, S1 normal and S2 normal. No murmur heard. No gallop.   Pulmonary:     Effort: Pulmonary effort is normal. No accessory muscle usage or respiratory distress.     Breath sounds: Normal breath sounds.  Abdominal:     General: Bowel sounds are normal. There is no distension.     Palpations: Abdomen is soft. There is no hepatomegaly.     Tenderness: There is no abdominal tenderness.  Musculoskeletal:        General: Normal range of motion.     Cervical back: Normal range of motion and neck supple.  Right lower leg: No edema.     Left lower leg: No edema.  Skin:    General: Skin is warm and dry.  Neurological:     Mental Status: He is alert and oriented to person, place, and time.  Psychiatric:        Mood and Affect: Mood normal.        Speech: Speech normal.        Behavior: Behavior normal.        Thought Content: Thought content normal.    Results for orders placed or performed during the hospital encounter of 76/28/31  Basic metabolic panel  Result Value Ref Range   Sodium 134 (L) 135 - 145 mmol/L   Potassium 4.3 3.5 - 5.1 mmol/L   Chloride 100 98 - 111 mmol/L   CO2 23 22 - 32 mmol/L   Glucose, Bld 125 (H) 70 - 99 mg/dL   BUN 8 6 - 20 mg/dL   Creatinine, Ser 0.83 0.61 - 1.24 mg/dL   Calcium 9.3 8.9 - 10.3 mg/dL   GFR, Estimated >60 >60 mL/min   Anion gap 11 5 - 15  CBC  Result Value Ref Range   WBC 10.8 (H) 4.0 - 10.5 K/uL   RBC 4.45 4.22 - 5.81 MIL/uL   Hemoglobin 15.6 13.0 - 17.0 g/dL   HCT 43.3 39.0 - 52.0 %   MCV 97.3 80.0 - 100.0 fL   MCH 35.1 (H) 26.0 - 34.0 pg   MCHC 36.0 30.0 - 36.0 g/dL   RDW 12.1 11.5 - 15.5 %   Platelets 144 (L) 150 - 400 K/uL   nRBC 0.0 0.0 - 0.2 %  Hepatic function panel  Result Value Ref Range   Total Protein 7.9 6.5 - 8.1 g/dL   Albumin 4.2 3.5 - 5.0 g/dL   AST 250 (H) 15 - 41 U/L   ALT 152 (H) 0 - 44 U/L   Alkaline Phosphatase 87 38 - 126 U/L   Total  Bilirubin 1.1 0.3 - 1.2 mg/dL   Bilirubin, Direct 0.3 (H) 0.0 - 0.2 mg/dL   Indirect Bilirubin 0.8 0.3 - 0.9 mg/dL  Troponin I (High Sensitivity)  Result Value Ref Range   Troponin I (High Sensitivity) 5 <18 ng/L  Troponin I (High Sensitivity)  Result Value Ref Range   Troponin I (High Sensitivity) 4 <18 ng/L      Assessment & Plan:   Problem List Items Addressed This Visit      Cardiovascular and Mediastinum   Hypertension    Chronic, ongoing with BP at goal on exam today.  Will continue current medication regimen and adjust as needed, refills sent in.  Check BP at home at least 3 mornings a week.  Continue cutting back on alcohol use.  Focus on DASH diet recommended.  Return in 6 months for follow-up.  CMP, TSH, and CBC today.      Relevant Medications   metoprolol succinate (TOPROL-XL) 25 MG 24 hr tablet   lisinopril (ZESTRIL) 40 MG tablet     Digestive   GERD (gastroesophageal reflux disease)    Chronic, stable.  Continue Nexium which offers him benefit and obtain Mag level annually.  Consider GI referral if any worsening symptoms.  Recommend complete cessation of alcohol use.      Relevant Orders   Magnesium     Other   Elevated transaminase level    Check CMP today and recommend continue to cut back on alcohol.  Will obtain sleep  study, referral in place, to see if decreases anxiety at night and enhances sleep, which may in turn decrease his alcohol intake.      Relevant Orders   Comprehensive metabolic panel   VITAMIN D 25 Hydroxy (Vit-D Deficiency, Fractures)   Gamma GT   Hepatitis C antibody   Mixed hyperlipidemia    Chronic, ongoing.  Not taking Lopid as recommended.  Recheck cholesterol panel today fasting. Continue to support on alcohol cessation journey.  Recommend continued focus on diet and exercise.  Restart medication if needed.      Relevant Medications   metoprolol succinate (TOPROL-XL) 25 MG 24 hr tablet   lisinopril (ZESTRIL) 40 MG tablet   Other  Relevant Orders   Lipid Panel w/o Chol/HDL Ratio   Anxiety and depression    Chronic, ongoing.  Is minimally using Klonopin and reports no need for refill today.  Continue minimal use of this.  Pt is aware of risks of benzo medication use to include increased sedation, respiratory suppression, falls, dependence and cardiovascular events.  Pt would like to continue treatment as benefit determined to outweigh risk.  UDS and contract next visit.  Continue to recommend alternate medications and support on alcohol cessation.       Gout    Chronic, stable with Allopurinol.  No recent flares.  Check uric acid level today.      Relevant Orders   Uric acid   EtOH dependence ([place]) - Primary    Currently returned to heavier use, check CMP, GGT, Vit D, and Hep C antibody today.  Recommend reduction of alcohol use.  He is hoping obtaining sleep study and CPAP will help with this, as uses alcohol more for sleep. Could consider addition of Naltrexone in future to assist in reduction.        Relevant Orders   Comprehensive metabolic panel   CBC with Differential/Platelet   VITAMIN D 25 Hydroxy (Vit-D Deficiency, Fractures)   Hepatitis C antibody   Insomnia    Referral to neurology in place and will work on sleep study to further evaluate.      Obesity    BMI 39.37.  Recommended eating smaller high protein, low fat meals more frequently and exercising 30 mins a day 5 times a week with a goal of 10-15lb weight loss in the next 3 months. Patient voiced their understanding and motivation to adhere to these recommendations.       Relevant Orders   TSH   Hemoglobin A1c       Follow up plan: Return in about 8 weeks (around 08/20/2020) for Annual physical.

## 2020-06-24 NOTE — Assessment & Plan Note (Signed)
Chronic, ongoing.  Is minimally using Klonopin and reports no need for refill today.  Continue minimal use of this.  Pt is aware of risks of benzo medication use to include increased sedation, respiratory suppression, falls, dependence and cardiovascular events.  Pt would like to continue treatment as benefit determined to outweigh risk.  UDS and contract next visit.  Continue to recommend alternate medications and support on alcohol cessation.

## 2020-06-24 NOTE — Patient Instructions (Signed)

## 2020-06-24 NOTE — Assessment & Plan Note (Addendum)
Currently returned to heavier use, check CMP, GGT, Vit D, and Hep C antibody today.  Recommend reduction of alcohol use.  He is hoping obtaining sleep study and CPAP will help with this, as uses alcohol more for sleep. Could consider addition of Naltrexone in future to assist in reduction.

## 2020-06-24 NOTE — Assessment & Plan Note (Signed)
Chronic, ongoing.  Not taking Lopid as recommended.  Recheck cholesterol panel today fasting. Continue to support on alcohol cessation journey.  Recommend continued focus on diet and exercise.  Restart medication if needed.

## 2020-06-24 NOTE — Assessment & Plan Note (Addendum)
Chronic, stable.  Continue Nexium which offers him benefit and obtain Mag level annually.  Consider GI referral if any worsening symptoms.  Recommend complete cessation of alcohol use.

## 2020-06-24 NOTE — Assessment & Plan Note (Signed)
Check CMP today and recommend continue to cut back on alcohol.  Will obtain sleep study, referral in place, to see if decreases anxiety at night and enhances sleep, which may in turn decrease his alcohol intake.

## 2020-06-24 NOTE — Assessment & Plan Note (Signed)
BMI 39.37.  Recommended eating smaller high protein, low fat meals more frequently and exercising 30 mins a day 5 times a week with a goal of 10-15lb weight loss in the next 3 months. Patient voiced their understanding and motivation to adhere to these recommendations.

## 2020-06-25 LAB — CBC WITH DIFFERENTIAL/PLATELET
Basophils Absolute: 0 10*3/uL (ref 0.0–0.2)
Basos: 1 %
EOS (ABSOLUTE): 0.2 10*3/uL (ref 0.0–0.4)
Eos: 2 %
Hematocrit: 45.4 % (ref 37.5–51.0)
Hemoglobin: 15.7 g/dL (ref 13.0–17.7)
Immature Grans (Abs): 0 10*3/uL (ref 0.0–0.1)
Immature Granulocytes: 0 %
Lymphocytes Absolute: 2.1 10*3/uL (ref 0.7–3.1)
Lymphs: 29 %
MCH: 34.7 pg — ABNORMAL HIGH (ref 26.6–33.0)
MCHC: 34.6 g/dL (ref 31.5–35.7)
MCV: 100 fL — ABNORMAL HIGH (ref 79–97)
Monocytes Absolute: 0.7 10*3/uL (ref 0.1–0.9)
Monocytes: 9 %
Neutrophils Absolute: 4.2 10*3/uL (ref 1.4–7.0)
Neutrophils: 59 %
Platelets: 105 10*3/uL — ABNORMAL LOW (ref 150–450)
RBC: 4.52 x10E6/uL (ref 4.14–5.80)
RDW: 11.8 % (ref 11.6–15.4)
WBC: 7.1 10*3/uL (ref 3.4–10.8)

## 2020-06-25 LAB — TSH: TSH: 1.66 u[IU]/mL (ref 0.450–4.500)

## 2020-06-25 LAB — COMPREHENSIVE METABOLIC PANEL
ALT: 175 IU/L — ABNORMAL HIGH (ref 0–44)
AST: 158 IU/L — ABNORMAL HIGH (ref 0–40)
Albumin/Globulin Ratio: 1.8 (ref 1.2–2.2)
Albumin: 4.4 g/dL (ref 4.0–5.0)
Alkaline Phosphatase: 84 IU/L (ref 44–121)
BUN/Creatinine Ratio: 12 (ref 9–20)
BUN: 10 mg/dL (ref 6–20)
Bilirubin Total: 0.5 mg/dL (ref 0.0–1.2)
CO2: 20 mmol/L (ref 20–29)
Calcium: 9.7 mg/dL (ref 8.7–10.2)
Chloride: 99 mmol/L (ref 96–106)
Creatinine, Ser: 0.84 mg/dL (ref 0.76–1.27)
Globulin, Total: 2.4 g/dL (ref 1.5–4.5)
Glucose: 112 mg/dL — ABNORMAL HIGH (ref 65–99)
Potassium: 3.9 mmol/L (ref 3.5–5.2)
Sodium: 138 mmol/L (ref 134–144)
Total Protein: 6.8 g/dL (ref 6.0–8.5)
eGFR: 117 mL/min/{1.73_m2} (ref >59)

## 2020-06-25 LAB — MAGNESIUM: Magnesium: 2.1 mg/dL (ref 1.6–2.3)

## 2020-06-25 LAB — VITAMIN D 25 HYDROXY (VIT D DEFICIENCY, FRACTURES): Vit D, 25-Hydroxy: 18.4 ng/mL — ABNORMAL LOW (ref 30.0–100.0)

## 2020-06-25 LAB — LIPID PANEL W/O CHOL/HDL RATIO
Cholesterol, Total: 220 mg/dL — ABNORMAL HIGH (ref 100–199)
HDL: 26 mg/dL — ABNORMAL LOW (ref >39)
LDL Chol Calc (NIH): 138 mg/dL — ABNORMAL HIGH (ref 0–99)
Triglycerides: 306 mg/dL — ABNORMAL HIGH (ref 0–149)
VLDL Cholesterol Cal: 56 mg/dL — ABNORMAL HIGH (ref 5–40)

## 2020-06-25 LAB — GAMMA GT: GGT: 323 IU/L — ABNORMAL HIGH (ref 0–65)

## 2020-06-25 LAB — HEMOGLOBIN A1C
Est. average glucose Bld gHb Est-mCnc: 108 mg/dL
Hgb A1c MFr Bld: 5.4 % (ref 4.8–5.6)

## 2020-06-25 LAB — HEPATITIS C ANTIBODY: Hep C Virus Ab: <0.1 s/co ratio (ref 0.0–0.9)

## 2020-06-25 NOTE — Progress Notes (Signed)
Contacted via [facility], your labs have returned.  Our referral coordinator told me last night that you should have heard from sleep center this morning, did you hear from them to schedule?  As for labs: - Glucose mildly elevated at 112, monitor your sugar and carbohydrate intake to ensure this does not continue to creep up and we will recheck in future.  A1c shows no diabetes or prediabetes!! - Kidney function is stable, but liver enzymes remain elevated although trending down to 158/175.  Your GGT is also elevated at 323.  I would recommend doing a liver ultrasound to further assess and ensure no cirrhotic changes with your alcohol use, as we discussed.  Would you like me to order this? - CBC shows no anemia, but platelet levels have decreased to 105.  I do recommend the liver ultrasound to further assess. - Cholesterol levels are elevated -- triglycerides are elevated.  Would be good to restart Lopid if you have some at home. - Thyroid is normal - Hep C is negative - Vitamin D level is on low side, this can be result of liver as well.  I do recommend you start taking Vitamin D3 2000 units daily which is good for bone and muscle health.  Any questions? Keep being awesome!!  Thank you for allowing me to participate in your care. Kindest regards, Chelli Yerkes

## 2020-08-13 ENCOUNTER — Encounter: Payer: Self-pay | Admitting: Neurology

## 2020-08-13 ENCOUNTER — Ambulatory Visit: Payer: BC Managed Care – PPO | Admitting: Neurology

## 2020-08-13 ENCOUNTER — Other Ambulatory Visit: Payer: Self-pay

## 2020-08-13 VITALS — BP 134/82 | HR 77 | Ht 65.0 in | Wt 238.0 lb

## 2020-08-13 DIAGNOSIS — E669 Obesity, unspecified: Secondary | ICD-10-CM | POA: Diagnosis not present

## 2020-08-13 DIAGNOSIS — F1010 Alcohol abuse, uncomplicated: Secondary | ICD-10-CM

## 2020-08-13 DIAGNOSIS — F419 Anxiety disorder, unspecified: Secondary | ICD-10-CM

## 2020-08-13 DIAGNOSIS — R0683 Snoring: Secondary | ICD-10-CM

## 2020-08-13 DIAGNOSIS — G4719 Other hypersomnia: Secondary | ICD-10-CM

## 2020-08-13 DIAGNOSIS — J351 Hypertrophy of tonsils: Secondary | ICD-10-CM

## 2020-08-13 DIAGNOSIS — Z820 Family history of epilepsy and other diseases of the nervous system: Secondary | ICD-10-CM

## 2020-08-13 DIAGNOSIS — R351 Nocturia: Secondary | ICD-10-CM

## 2020-08-13 DIAGNOSIS — Z9189 Other specified personal risk factors, not elsewhere classified: Secondary | ICD-10-CM

## 2020-08-13 NOTE — Progress Notes (Signed)
Subjective:    Patient ID: [name]. is a 35 y.o. male.  HPI    Star Age, MD, PhD [facility] [address] [place], [place] [address]  Dear Henrine Screws,   I saw your patient, Yaasir Menken, upon your kind request in my sleep clinic today for initial consultation of his sleep disorder, in particular, concern for underlying obstructive sleep apnea.  The patient is unaccompanied today.  As you know, Mr. Buchler is a 35 year old right-handed gentleman with an underlying medical history of anxiety, chronic back pain, reflux disease, gout, hyperlipidemia, alcohol use disorder, and obesity, who reports snoring and excessive daytime somnolence, and chronic difficulty maintaining sleep.  I reviewed your office note from 06/24/2020.  He is working on alcohol cessation.  He reports chronic difficulty maintaining sleep and has tried over-the-counter medication including p.m. medications, melatonin and has even had Ambien which did not help.  He has been utilizing alcohol at night to help him fall asleep and stay asleep.  He goes to bed around 830 and rise time is around 3.  He has to be at work at 6 AM and has to leave the house by 5 AM.  He lives with his family which includes wife, 50 year old son, 49 year old daughter and her 41-month-old child and patient's father who is 42 years old.  His paternal uncle he reports has CPAP therapy for sleep apnea.  Patient reports tonsillar hypertrophy and recurrent strep throat growing up.  He has woken up with a sense of panic and has vivid dreams.  He has had weight fluctuation, denies recurrent morning headaches but has had nocturia about once per average night.  He has a TV in the bedroom but does not watch it at night.  They have 3 dogs in the household which do not sleep in the bedroom with them.  He has been reducing his alcohol intake and is working on alcohol cessation at night.  He chews tobacco, is a non-smoker of  cigarettes, he has no day-to-day caffeine.  He has seen a psychiatrist before and the diagnosis of bipolar was discussed but patient reports that he was not formally diagnosed with bipolar disease.  He does not see a psychiatrist currently and is currently not on any antidepressant medication and uses clonazepam rarely, only as needed.   His Past Medical History Is Significant For: Past Medical History:  Diagnosis Date   ADHD (attention deficit hyperactivity disorder)    Anxiety    Bipolar 1 disorder ([place])    Bipolar 1 disorder ([place]) 08/19/2014   Chronic back pain    Depression    GERD (gastroesophageal reflux disease)    Gout    Hyperlipidemia     His Past Surgical History Is Significant For: Past Surgical History:  Procedure Laterality Date   APPENDECTOMY     KIDNEY STONE SURGERY      His Family History Is Significant For: Family History  Problem Relation Age of Onset   Hyperlipidemia Father    Cancer Paternal Grandmother        pancreatic    His Social History Is Significant For: Social History   Socioeconomic History   Marital status: Married    Spouse name: Not on file   Number of children: Not on file   Years of education: Not on file   Highest education level: Not on file  Occupational History   Not on file  Tobacco Use   Smoking status: Never  Smokeless tobacco: Current    Types: Chew  Vaping Use   Vaping Use: Never used  Substance and Sexual Activity   Alcohol use: Yes    Comment: 1 pint of liquor   Drug use: No   Sexual activity: Yes  Other Topics Concern   Not on file  Social History Narrative   Not on file   Social Determinants of Health   Financial Resource Strain: Not on file  Food Insecurity: Not on file  Transportation Needs: Not on file  Physical Activity: Not on file  Stress: Not on file  Social Connections: Not on file    His Allergies Are:  Allergies  Allergen Reactions   Cyclobenzaprine Other (See Comments)    Dizziness    Penicillin G Benzathine    Amoxicillin Rash  :   His Current Medications Are:  Outpatient Encounter Medications as of 08/13/2020  Medication Sig   albuterol (VENTOLIN HFA) 108 (90 Base) MCG/ACT inhaler Inhale 2 puffs into the lungs every 6 (six) hours as needed for wheezing or shortness of breath.   allopurinol (ZYLOPRIM) 300 MG tablet Take 1 tablet (300 mg total) by mouth daily.   clonazePAM (KLONOPIN) 0.5 MG tablet Take 1 tablet (0.5 mg total) by mouth 2 (two) times daily as needed for anxiety.   colchicine 0.6 MG tablet Take 0.6 mg by mouth daily as needed.    esomeprazole (NEXIUM) 40 MG capsule Take 1 capsule (40 mg total) by mouth daily.   lisinopril (ZESTRIL) 40 MG tablet Take 1 tablet (40 mg total) by mouth daily.   metoprolol succinate (TOPROL-XL) 25 MG 24 hr tablet Take 1 tablet (25 mg total) by mouth daily.   No facility-administered encounter medications on file as of 08/13/2020.  :   Review of Systems:  Out of a complete 14 point review of systems, all are reviewed and negative with the exception of these symptoms as listed below:  Review of Systems  Neurological:        Here for sleep consult. No prior sleep study, snoring is present.  Reports he has trouble falling asleep and staying asleep unless he drinks alcohol at night. Epworth Sleepiness Scale 0= would never doze 1= slight chance of dozing 2= moderate chance of dozing 3= high chance of dozing  Sitting and reading:2 Watching TV:1 Sitting inactive in a public place (ex. Theater or meeting):1 As a passenger in a car for an hour without a break:2 Lying down to rest in the afternoon:2 Sitting and talking to someone:1 Sitting quietly after lunch (no alcohol):1 In a car, while stopped in traffic:1 Total:11     Objective:  Neurological Exam  Physical Exam Physical Examination:   Vitals:   08/13/20 1452  BP: 134/82  Pulse: 77    General Examination: The patient is a very pleasant 35 y.o. male in no  acute distress. He appears well-developed and well-nourished and well groomed.   HEENT: Normocephalic, atraumatic, pupils are equal, round and reactive to light, extraocular tracking is good without limitation to gaze excursion or nystagmus noted. Hearing is grossly intact. Face is symmetric with normal facial animation. Speech is clear with no dysarthria noted. There is no hypophonia. There is no lip, neck/head, jaw or voice tremor. Neck is supple with full range of passive and active motion. There are no carotid bruits on auscultation. Oropharynx exam reveals: mild mouth dryness, adequate dental hygiene and moderate airway crowding, due to thicker soft palate, small airway entry, tonsillar size of about 2+  bilaterally.  Tongue protrudes centrally and palate elevates symmetrically, Mallampati class II.  Neck circumference of 18 1/8 inches.  He has a significant overbite.  Chest: Clear to auscultation without wheezing, rhonchi or crackles noted.  Heart: S1+S2+0, regular and normal without murmurs, rubs or gallops noted.   Abdomen: Soft, non-tender and non-distended with normal bowel sounds appreciated on auscultation.  Extremities: There is no pitting edema in the distal lower extremities bilaterally.   Skin: Warm and dry without trophic changes noted.   Musculoskeletal: exam reveals no obvious joint deformities, tenderness or joint swelling or erythema.   Neurologically:  Mental status: The patient is awake, alert and oriented in all 4 spheres. His immediate and remote memory, attention, language skills and fund of knowledge are appropriate. There is no evidence of aphasia, agnosia, apraxia or anomia. Speech is clear with normal prosody and enunciation. Thought process is linear. Mood is normal and affect is normal.  Cranial nerves II - XII are as described above under HEENT exam.  Motor exam: Normal bulk, strength and tone is noted. There is no tremor, Romberg is negative. Fine motor skills and  coordination: grossly intact.  Cerebellar testing: No dysmetria or intention tremor. There is no truncal or gait ataxia.  Sensory exam: intact to light touch in the upper and lower extremities.  Gait, station and balance: He stands easily. No veering to one side is noted. No leaning to one side is noted. Posture is age-appropriate and stance is narrow based. Gait shows normal stride length and normal pace. No problems turning are noted. Tandem walk is unremarkable.                Assessment and Plan:   In summary, Mikaeel Petrow. is a very pleasant 35 y.o.-year old male with an underlying medical history of anxiety, chronic back pain, reflux disease, gout, hyperlipidemia, alcohol use disorder, and obesity, whose history and physical exam are concerning for obstructive sleep apnea (OSA). I had a long chat with the patient about my findings and the diagnosis of OSA its prognosis and treatment options. We talked about medical treatments, surgical interventions and non-pharmacological approaches. I explained in particular the risks and ramifications of untreated moderate to severe OSA, especially with respect to developing cardiovascular disease down the Road, including congestive heart failure, difficult to treat hypertension, cardiac arrhythmias, or stroke. Even type 2 diabetes has, in part, been linked to untreated OSA. Symptoms of untreated OSA include daytime sleepiness, memory problems, mood irritability and mood disorder such as depression and anxiety, lack of energy, as well as recurrent headaches, especially morning headaches. We talked about smoking cessation and trying to maintain a healthy lifestyle in general, as well as the importance of weight control. We also talked about the importance of good sleep hygiene.  He is advised to continue to work on alcohol cessation. I recommended the following at this time: sleep study.   I explained the sleep test procedure to the patient and also outlined  possible surgical and non-surgical treatment options of OSA, including the use of a custom-made dental device (which would require a referral to a specialist dentist or oral surgeon), upper airway surgical options, such as traditional UPPP or a novel less invasive surgical option in the form of Inspire hypoglossal nerve stimulation (which would involve a referral to an ENT surgeon). I also explained the CPAP treatment option to the patient, who indicated that he would be willing to try CPAP if the need arises. I explained  the importance of being compliant with PAP treatment, not only for insurance purposes but primarily to improve His symptoms, and for the patient's long term health benefit, including to reduce His cardiovascular risks. I answered all his questions today and the patient was in agreement. I plan to see him back after the sleep study is completed and encouraged him to call with any interim questions, concerns, problems or updates.   Thank you very much for allowing me to participate in the care of this nice patient. If I can be of any further assistance to you please do not hesitate to call me at [phone].  Sincerely,   Star Age, MD, PhD

## 2020-08-13 NOTE — Patient Instructions (Signed)

## 2020-08-20 ENCOUNTER — Ambulatory Visit: Payer: BC Managed Care – PPO | Admitting: Nurse Practitioner

## 2020-08-20 ENCOUNTER — Encounter: Payer: BC Managed Care – PPO | Admitting: Nurse Practitioner

## 2020-09-08 ENCOUNTER — Ambulatory Visit (INDEPENDENT_AMBULATORY_CARE_PROVIDER_SITE_OTHER): Payer: BC Managed Care – PPO | Admitting: Neurology

## 2020-09-08 DIAGNOSIS — Z9189 Other specified personal risk factors, not elsewhere classified: Secondary | ICD-10-CM

## 2020-09-08 DIAGNOSIS — R351 Nocturia: Secondary | ICD-10-CM

## 2020-09-08 DIAGNOSIS — R0683 Snoring: Secondary | ICD-10-CM

## 2020-09-08 DIAGNOSIS — Z820 Family history of epilepsy and other diseases of the nervous system: Secondary | ICD-10-CM

## 2020-09-08 DIAGNOSIS — E669 Obesity, unspecified: Secondary | ICD-10-CM

## 2020-09-08 DIAGNOSIS — J351 Hypertrophy of tonsils: Secondary | ICD-10-CM

## 2020-09-08 DIAGNOSIS — G4733 Obstructive sleep apnea (adult) (pediatric): Secondary | ICD-10-CM | POA: Diagnosis not present

## 2020-09-08 DIAGNOSIS — F1010 Alcohol abuse, uncomplicated: Secondary | ICD-10-CM

## 2020-09-08 DIAGNOSIS — F419 Anxiety disorder, unspecified: Secondary | ICD-10-CM

## 2020-09-08 DIAGNOSIS — G4719 Other hypersomnia: Secondary | ICD-10-CM

## 2020-09-09 NOTE — Progress Notes (Signed)
See procedure note.

## 2020-09-11 NOTE — Procedures (Signed)
   [facility]  HOME SLEEP TEST (Watch PAT) REPORT  STUDY DATE: 09/08/20  DOB: 06-10-1985  MRN: [id number]  ORDERING CLINICIAN: Star Age, MD, PhD   REFERRING CLINICIAN: Venita Lick, NP   CLINICAL INFORMATION/HISTORY: 35 year old right-handed gentleman with an underlying medical history of anxiety, chronic back pain, reflux disease, gout, hyperlipidemia, alcohol use disorder, and obesity, who reports snoring and excessive daytime somnolence, and chronic difficulty maintaining sleep.    Epworth sleepiness score: 11/24.  BMI: 39.7 kg/m  FINDINGS:   Sleep Summary:   Total Recording Time (hours, min): 10 h, 4 min  Total Sleep Time (hours, min):  8 h, 53   Percent REM (%):    29.7%   Respiratory Indices:   Calculated pAHI (per hour):  9.1/hour         REM pAHI:    19.7/hour       NREM pAHI: 4.8/hour  Oxygen Saturation Statistics:    Oxygen Saturation (%) Mean: 93%   Minimum oxygen saturation (%):                 82%   O2 Saturation Range (%): 82 - 98%    O2 Saturation (minutes) <=88%: 0.5 min  Pulse Rate Statistics:   Pulse Mean (bpm):    73/min    Pulse Range (59 - 93/min)   IMPRESSION: OSA (obstructive sleep apnea), mild  RECOMMENDATION:  This home sleep test demonstrates overall mild obstructive sleep apnea with a total AHI of 9.1/hour and O2 nadir of 82%. Snoring was detected, and appeared to be mild to moderate, intermittent. Given the patient's medical history and sleep related complaints, treatment with positive airway pressure is recommended. This can be achieved in the form of autoPAP trial/titration at home. A full night CPAP titration study will help with proper treatment settings and mask fitting if needed. Alternative treatments include weight loss along with avoidance of the supine sleep position, or an oral appliance in appropriate candidates.   Please note that untreated obstructive sleep apnea carries additional  perioperative morbidity. Patients with significant obstructive sleep apnea should receive perioperative PAP therapy and the surgeons and particularly the anesthesiologist should be informed of the diagnosis and the severity of the sleep disordered breathing. The patient should be cautioned not to drive, work at heights, or operate dangerous or heavy equipment when tired or sleepy. Review and reiteration of good sleep hygiene measures should be pursued with any patient. Other causes of the patient's symptoms, including circadian rhythm disturbances, an underlying mood disorder, medication effect and/or an underlying medical problem cannot be ruled out based on this test. Clinical correlation is recommended.   The patient and his referring provider will be notified of the test results. The patient will be seen in follow up in sleep clinic at [facility].  I certify that I have reviewed the raw data recording prior to the issuance of this report in accordance with the standards of the American Academy of Sleep Medicine (AASM).  INTERPRETING PHYSICIAN:   Star Age, MD, PhD  Board Certified in Neurology and Sleep Medicine  [facility] [address] [place], [place] [address] [phone]

## 2020-09-11 NOTE — Addendum Note (Signed)
Addended by: Star Age on: 09/11/2020 06:22 PM   Modules accepted: Orders

## 2020-09-14 ENCOUNTER — Encounter: Payer: Self-pay | Admitting: Nurse Practitioner

## 2020-09-14 DIAGNOSIS — G4733 Obstructive sleep apnea (adult) (pediatric): Secondary | ICD-10-CM | POA: Insufficient documentation

## 2020-09-16 ENCOUNTER — Encounter: Payer: Self-pay | Admitting: *Deleted

## 2020-09-16 ENCOUNTER — Telehealth: Payer: Self-pay | Admitting: *Deleted

## 2020-09-16 ENCOUNTER — Other Ambulatory Visit: Payer: Self-pay | Admitting: Nurse Practitioner

## 2020-09-16 DIAGNOSIS — I10 Essential (primary) hypertension: Secondary | ICD-10-CM

## 2020-09-16 DIAGNOSIS — E6609 Other obesity due to excess calories: Secondary | ICD-10-CM

## 2020-09-16 DIAGNOSIS — Z6838 Body mass index (BMI) 38.0-38.9, adult: Secondary | ICD-10-CM

## 2020-09-16 DIAGNOSIS — E782 Mixed hyperlipidemia: Secondary | ICD-10-CM

## 2020-09-16 DIAGNOSIS — M1A9XX0 Chronic gout, unspecified, without tophus (tophi): Secondary | ICD-10-CM

## 2020-09-16 DIAGNOSIS — K219 Gastro-esophageal reflux disease without esophagitis: Secondary | ICD-10-CM

## 2020-09-16 DIAGNOSIS — E559 Vitamin D deficiency, unspecified: Secondary | ICD-10-CM

## 2020-09-16 NOTE — Telephone Encounter (Signed)
I called pt. I advised pt that Dr. Rexene Alberts reviewed their sleep study results and found that pt has sllep apnea (mild). Dr. Rexene Alberts recommends that pt start Autopap. I reviewed PAP compliance expectations with the pt. Pt is agreeable to starting an auto-PAP. I advised pt that an order will be sent to a DME, [facility], and [facility] will call the pt within about one week after they file with the pt's insurance. [facility] will show the pt how to use the machine, fit for masks, and troubleshoot the auto-PAP if needed. A follow up appt was made for insurance purposes with Dr. Rexene Alberts on 11-18-20 at 2P. Pt verbalized understanding to arrive 15 minutes early and bring their auto-PAP. A letter with all of this information in it will be mailed to the pt as a reminder. I verified with the pt that the address we have on file is correct. Pt verbalized understanding of results. Pt had no questions at this time but was encouraged to call back if questions arise. I have sent the order to [facility] and have received confirmation that they have received the order.

## 2020-09-16 NOTE — Telephone Encounter (Signed)
-----   Message from Star Age, MD sent at 09/11/2020  6:22 PM EDT ----- Patient referred by Marnee Guarneri, NP, seen by me on 08/13/20, HST on 09/08/20.    Please call and notify the patient that the recent home sleep test showed obstructive sleep apnea. OSA is overall mild, but worth treating to see if he feels better after treatment. To that end I recommend treatment for this in the form of autoPAP, which means, that we don't have to bring him in for a sleep study with CPAP, but will let him try an autoPAP machine at home, through a DME company (of his choice, or as per insurance requirement). The DME representative will educate him on how to use the machine, how to put the mask on, etc. I have placed an order in the chart. Please send referral, talk to patient, send report to referring MD. We will need a FU in sleep clinic for 10 weeks post-PAP set up, please arrange that with me or one of our NPs. Thanks,   Star Age, MD, PhD [facility])

## 2020-09-19 ENCOUNTER — Encounter: Payer: BC Managed Care – PPO | Admitting: Nurse Practitioner

## 2020-09-25 NOTE — Telephone Encounter (Signed)
I spoke with the pt's husband about ss results too. ( Ok per dpr)  Pt's wife wanted to know if she could "donate" an older CPAP she used to use to her husband until he is able to start on the new machine order by Dr. Rexene Alberts.  I advised he would need to check with [facility] and see if they could set his machine to setting recommended by Dr. Rexene Alberts.  I provided # for pt's wife to call and sent a community message to [facility] about this as well.  Wife was advised if she has not heard from [facility] by 09/29/20 on this to call. She verbalized understanding.

## 2020-10-29 ENCOUNTER — Encounter: Payer: Self-pay | Admitting: Nurse Practitioner

## 2020-10-29 ENCOUNTER — Ambulatory Visit: Payer: BC Managed Care – PPO | Admitting: Nurse Practitioner

## 2020-10-29 ENCOUNTER — Other Ambulatory Visit: Payer: Self-pay

## 2020-10-29 VITALS — BP 123/79 | HR 84 | Temp 99.3°F | Wt 238.6 lb

## 2020-10-29 DIAGNOSIS — K4090 Unilateral inguinal hernia, without obstruction or gangrene, not specified as recurrent: Secondary | ICD-10-CM

## 2020-10-29 DIAGNOSIS — Z9889 Other specified postprocedural states: Secondary | ICD-10-CM | POA: Insufficient documentation

## 2020-10-29 DIAGNOSIS — B356 Tinea cruris: Secondary | ICD-10-CM | POA: Diagnosis not present

## 2020-10-29 DIAGNOSIS — Z8719 Personal history of other diseases of the digestive system: Secondary | ICD-10-CM | POA: Insufficient documentation

## 2020-10-29 DIAGNOSIS — N433 Hydrocele, unspecified: Secondary | ICD-10-CM | POA: Diagnosis not present

## 2020-10-29 MED ORDER — FLUCONAZOLE 150 MG PO TABS: 150.0000 mg | ORAL_TABLET | Freq: Once | ORAL | 0 refills | Status: AC

## 2020-10-29 MED ORDER — NYSTATIN 100000 UNIT/GM EX POWD: 1.0000 "application " | Freq: Three times a day (TID) | CUTANEOUS | 4 refills | Status: DC

## 2020-10-29 NOTE — Assessment & Plan Note (Signed)
Noted on recent ER ultrasound at [facility] = would like to see local urology vs [facility]. Will placed referral to [facility] urology practice for further evaluation in recommendations.

## 2020-10-29 NOTE — Progress Notes (Signed)
BP 123/79   Pulse 84   Temp 99.3 F (37.4 C) (Oral)   Wt 238 lb 9.6 oz (108.2 kg)   SpO2 97%   BMI 39.71 kg/m    Subjective:    Patient ID: Nathan Haws., male    DOB: [birth year], 35 y.o.   MRN: [id number]  HPI: Nathan Elsass. is a 35 y.o. male  Chief Complaint  Patient presents with   Testicular Mass    Patient is here for a lump in left testicle. Patient first noticed it about a month ago and has not increased in size. Patient states he has been to the ED and had an ultrasound and was informed that it was not cancerous. Patient states he did have some discomfort when he first noticed the lump in testicle, but states it was not crippling.    Rash    Patient states since he has been dealing with this issues of his testicles being on the heavier side it has caused a lot of rubbing in his upper thigh area (interior). Patient states he has went through 3 pairs of underwear to try and help it and nothing has helped. Patient states at rest his pain level is a 5 and moving around it is a 7.    TESTICULAR MASS: Noticed it about one month back, was seen in [facility] ER on 10/08/20 for this and had ultrasound imaging which noted a left inguinal fat-containing hernia (non reducible), trace bilateral hydroceles, and some edema in left epididymis (possible epididymitis).  There was no evidence of mass or cancerous findings.  A referral was placed to urology while at [facility] for hydroceles and general surgery for hernia assessment -- he would prefer referrals to [facility] providers.  He was given Levaquin for treatment -- did not complete treatment, reports he had Covid 5 days in and wanted to ensure he could see what symptoms were what.  He denies any discomfort to testicular area, with exception of rash/chafing.    RASH Reports this is to testicular area due to chafing.  Started beginning of August. Duration:  weeks  Location: groin  Itching: no Burning: no Redness: yes Oozing: no Scaling:  no Blisters: no Painful: yes Fevers: no Change in detergents/soaps/personal care products: no Recent illness: no History of same: yes Context: fluctuating Alleviating factors: nothing Treatments attempted: Gold Bond Cream and powder Shortness of breath: no  Throat/tongue swelling: no Myalgias/arthralgias: no   Relevant past medical, surgical, family and social history reviewed and updated as indicated. Interim medical history since our last visit reviewed. Allergies and medications reviewed and updated.  Review of Systems  Constitutional:  Negative for activity change, diaphoresis, fatigue and fever.  Respiratory:  Negative for cough, chest tightness, shortness of breath and wheezing.   Cardiovascular:  Negative for chest pain, palpitations and leg swelling.  Gastrointestinal: Negative.   Skin:  Positive for rash.  Neurological: Negative.   Psychiatric/Behavioral: Negative.     Per HPI unless specifically indicated above     Objective:    BP 123/79   Pulse 84   Temp 99.3 F (37.4 C) (Oral)   Wt 238 lb 9.6 oz (108.2 kg)   SpO2 97%   BMI 39.71 kg/m   Wt Readings from Last 3 Encounters:  10/29/20 238 lb 9.6 oz (108.2 kg)  08/13/20 238 lb (108 kg)  06/24/20 236 lb 9.6 oz (107.3 kg)    Physical Exam Vitals and nursing note reviewed. Exam conducted with a  chaperone present.  Constitutional:      General: He is not in acute distress.    Appearance: He is well-developed and well-groomed. He is obese. He is not ill-appearing.  HENT:     Head: Normocephalic and atraumatic.     Right Ear: Hearing normal. No drainage.     Left Ear: Hearing normal. No drainage.  Eyes:     General: Lids are normal. No scleral icterus.       Right eye: No discharge.        Left eye: No discharge.     Conjunctiva/sclera: Conjunctivae normal.     Pupils: Pupils are equal, round, and reactive to light.  Neck:     Thyroid: No thyromegaly.     Vascular: No carotid bruit.  Cardiovascular:      Rate and Rhythm: Normal rate and regular rhythm.     Heart sounds: Normal heart sounds, S1 normal and S2 normal. No murmur heard.   No gallop.  Pulmonary:     Effort: Pulmonary effort is normal. No accessory muscle usage or respiratory distress.     Breath sounds: Normal breath sounds.  Abdominal:     General: Bowel sounds are normal. There is no distension.     Palpations: Abdomen is soft. There is no hepatomegaly.     Tenderness: There is no abdominal tenderness.  Genitourinary:    Penis: Normal.      Testes: Normal.     Comments: Skin to bilateral inner thighs and groin area with moderate erythema, skin intact. Musculoskeletal:        General: Normal range of motion.     Cervical back: Normal range of motion and neck supple.     Right lower leg: No edema.     Left lower leg: No edema.  Skin:    General: Skin is warm and dry.  Neurological:     Mental Status: He is alert and oriented to person, place, and time.  Psychiatric:        Mood and Affect: Mood normal.        Speech: Speech normal.        Behavior: Behavior normal.        Thought Content: Thought content normal.   Results for orders placed or performed in visit on 06/24/20  Comprehensive metabolic panel  Result Value Ref Range   Glucose 112 (H) 65 - 99 mg/dL   BUN 10 6 - 20 mg/dL   Creatinine, Ser 0.84 0.76 - 1.27 mg/dL   eGFR 117 >59 mL/min/1.73   BUN/Creatinine Ratio 12 9 - 20   Sodium 138 134 - 144 mmol/L   Potassium 3.9 3.5 - 5.2 mmol/L   Chloride 99 96 - 106 mmol/L   CO2 20 20 - 29 mmol/L   Calcium 9.7 8.7 - 10.2 mg/dL   Total Protein 6.8 6.0 - 8.5 g/dL   Albumin 4.4 4.0 - 5.0 g/dL   Globulin, Total 2.4 1.5 - 4.5 g/dL   Albumin/Globulin Ratio 1.8 1.2 - 2.2   Bilirubin Total 0.5 0.0 - 1.2 mg/dL   Alkaline Phosphatase 84 44 - 121 IU/L   AST 158 (H) 0 - 40 IU/L   ALT 175 (H) 0 - 44 IU/L  CBC with Differential/Platelet  Result Value Ref Range   WBC 7.1 3.4 - 10.8 x10E3/uL   RBC 4.52 4.14 - 5.80  x10E6/uL   Hemoglobin 15.7 13.0 - 17.7 g/dL   Hematocrit 45.4 37.5 - 51.0 %   MCV  100 (H) 79 - 97 fL   MCH 34.7 (H) 26.6 - 33.0 pg   MCHC 34.6 31.5 - 35.7 g/dL   RDW 11.8 11.6 - 15.4 %   Platelets 105 (L) 150 - 450 x10E3/uL   Neutrophils 59 Not Estab. %   Lymphs 29 Not Estab. %   Monocytes 9 Not Estab. %   Eos 2 Not Estab. %   Basos 1 Not Estab. %   Neutrophils Absolute 4.2 1.4 - 7.0 x10E3/uL   Lymphocytes Absolute 2.1 0.7 - 3.1 x10E3/uL   Monocytes Absolute 0.7 0.1 - 0.9 x10E3/uL   EOS (ABSOLUTE) 0.2 0.0 - 0.4 x10E3/uL   Basophils Absolute 0.0 0.0 - 0.2 x10E3/uL   Immature Granulocytes 0 Not Estab. %   Immature Grans (Abs) 0.0 0.0 - 0.1 x10E3/uL  Lipid Panel w/o Chol/HDL Ratio  Result Value Ref Range   Cholesterol, Total 220 (H) 100 - 199 mg/dL   Triglycerides 306 (H) 0 - 149 mg/dL   HDL 26 (L) >39 mg/dL   VLDL Cholesterol Cal 56 (H) 5 - 40 mg/dL   LDL Chol Calc (NIH) 138 (H) 0 - 99 mg/dL  TSH  Result Value Ref Range   TSH 1.660 0.450 - 4.500 uIU/mL  VITAMIN D 25 Hydroxy (Vit-D Deficiency, Fractures)  Result Value Ref Range   Vit D, 25-Hydroxy 18.4 (L) 30.0 - 100.0 ng/mL  Hemoglobin A1c  Result Value Ref Range   Hgb A1c MFr Bld 5.4 4.8 - 5.6 %   Est. average glucose Bld gHb Est-mCnc 108 mg/dL  Gamma GT  Result Value Ref Range   GGT 323 (H) 0 - 65 IU/L  Magnesium  Result Value Ref Range   Magnesium 2.1 1.6 - 2.3 mg/dL  Hepatitis C antibody  Result Value Ref Range   Hep C Virus Ab <0.1 0.0 - 0.9 s/co ratio      Assessment & Plan:   Problem List Items Addressed This Visit       Musculoskeletal and Integument   Tinea cruris    Groin area with moderate irritation.  Will send in Diflucan dosing x 1 and Nystatin powder.  Recommend continue to change underwear often and keep groin area dry.  Return for worsening or ongoing.      Relevant Medications   fluconazole (DIFLUCAN) 150 MG tablet   nystatin (MYCOSTATIN/NYSTOP) powder     Genitourinary   Hydrocele in  adult    Noted on recent ER ultrasound at [facility] = would like to see local urology vs [facility]. Will placed referral to [facility] urology practice for further evaluation in recommendations.      Relevant Orders   Ambulatory referral to Urology     Other   Left inguinal hernia - Primary    Referral to [facility] general surgery [place] for further evaluation and recommendations.      Relevant Orders   Ambulatory referral to General Surgery    Follow up plan: Return in about 8 weeks (around 12/24/2020) for Annual physical.

## 2020-10-29 NOTE — Patient Instructions (Signed)
Inguinal Hernia, Adult An inguinal hernia is when fat or your intestines push through a weak spot in a muscle where your leg meets your lower belly (groin). This causes a bulge. This kind of hernia could also be: In your scrotum, if you are male. In folds of skin around your vagina, if you are male. There are three types of inguinal hernias: Hernias that can be pushed back into the belly (are reducible). This type rarely causes pain. Hernias that cannot be pushed back into the belly (are incarcerated). Hernias that cannot be pushed back into the belly and lose their blood supply (are strangulated). This type needs emergency surgery. What are the causes? This condition is caused by having a weak spot in the muscles or tissues in your groin. This develops over time. The hernia may poke through the weak spot when you strain your lower belly muscles all of a sudden, such as when you: Lift a heavy object. Strain to poop (have a bowel movement). Trouble pooping (constipation) can lead to straining. Cough. What increases the risk? This condition is more likely to develop in: Males. Pregnant females. People who: Are overweight. Work in jobs that require long periods of standing or heavy lifting. Have had an inguinal hernia before. Smoke or have lung disease. These factors can lead to long-term (chronic) coughing. What are the signs or symptoms? Symptoms may depend on the size of the hernia. Often, a small hernia has no symptoms. Symptoms of a larger hernia may include: A bulge in the groin area. This is easier to see when standing. You might not be able to see it when you are lying down. Pain or burning in the groin. This may get worse when you lift, strain, or cough. A dull ache or a feeling of pressure in the groin. An abnormal bulge in the scrotum, in males. Symptoms of a strangulated inguinal hernia may include: A bulge in your groin that is very painful and tender to the touch. A bulge  that turns red or purple. Fever, feeling like you may vomit (nausea), and vomiting. Not being able to poop or to pass gas. How is this treated? Treatment depends on the size of your hernia and whether you have symptoms. If you do not have symptoms, your doctor may have you watch your hernia carefully and have you come in for follow-up visits. If your hernia is large or if you have symptoms, you may need surgery to repair the hernia. Follow these instructions at home: Lifestyle Avoid lifting heavy objects. Avoid standing for long amounts of time. Do not smoke or use any products that contain nicotine or tobacco. If you need help quitting, ask your doctor. Stay at a healthy weight. Prevent trouble pooping You may need to take these actions to prevent or treat trouble pooping: Drink enough fluid to keep your pee (urine) pale yellow. Take over-the-counter or prescription medicines. Eat foods that are high in fiber. These include beans, whole grains, and fresh fruits and vegetables. Limit foods that are high in fat and sugar. These include fried or sweet foods. General instructions You may try to push your hernia back in place by very gently pressing on it when you are lying down. Do not try to push the bulge back in if it will not go in easily. Watch your hernia for any changes in shape, size, or color. Tell your doctor if you see any changes. Take over-the-counter and prescription medicines only as told by your doctor. Keep  all follow-up visits. Contact a doctor if: You have a fever or chills. You have new symptoms. Your symptoms get worse. Get help right away if: You have pain in your groin that gets worse all of a sudden. You have a bulge in your groin that: Gets bigger all of a sudden, and it does not get smaller after that. Turns red or purple. Is painful when you touch it. You are a male, and you have: Sudden pain in your scrotum. A sudden change in the size of your scrotum. You  cannot push the hernia back in place by very gently pressing on it when you are lying down. You feel like you may vomit, and that feeling does not go away. You keep vomiting. You have a fast heartbeat. You cannot poop or pass gas. These symptoms may be an emergency. Get help right away. Call your local emergency services (911 in the [place] Do not wait to see if the symptoms will go away. Do not drive yourself to the hospital. Summary An inguinal hernia is when fat or your intestines push through a weak spot in a muscle where your leg meets your lower belly (groin). This causes a bulge. If you do not have symptoms, you may not need treatment. If you have symptoms or a large hernia, you may need surgery. Avoid lifting heavy objects. Also, avoid standing for long amounts of time. Do not try to push the bulge back in if it will not go in easily. This information is not intended to replace advice given to you by your health care provider. Make sure you discuss any questions you have with your health care provider. Document Revised: 10/16/2019 Document Reviewed: 10/16/2019 Elsevier Patient Education  2022 [employer].

## 2020-10-29 NOTE — Assessment & Plan Note (Signed)
Referral to [facility] general surgery [place] for further evaluation and recommendations.

## 2020-10-29 NOTE — Assessment & Plan Note (Signed)
Groin area with moderate irritation.  Will send in Diflucan dosing x 1 and Nystatin powder.  Recommend continue to change underwear often and keep groin area dry.  Return for worsening or ongoing.

## 2020-11-04 MED ORDER — FLUCONAZOLE 150 MG PO TABS: 150.0000 mg | ORAL_TABLET | Freq: Once | ORAL | 0 refills | Status: AC

## 2020-11-06 ENCOUNTER — Encounter: Payer: Self-pay | Admitting: Surgery

## 2020-11-06 ENCOUNTER — Ambulatory Visit: Payer: BC Managed Care – PPO | Admitting: Surgery

## 2020-11-06 ENCOUNTER — Other Ambulatory Visit: Payer: Self-pay

## 2020-11-06 VITALS — BP 131/87 | HR 76 | Temp 98.0°F | Ht 65.0 in | Wt 233.0 lb

## 2020-11-06 DIAGNOSIS — K4090 Unilateral inguinal hernia, without obstruction or gangrene, not specified as recurrent: Secondary | ICD-10-CM | POA: Diagnosis not present

## 2020-11-06 NOTE — Patient Instructions (Addendum)
We spoke today about having your hernia repaired. We will have you follow up after seeing Urology to speak more about this.   You will need to arrange to be out of work for 2 weeks and then return with a lifting restrictions for 4 more weeks. Please send any FMLA paperwork prior to surgery and we will fill this out and fax it back to your employer within 3 business days.  You may have a bruise in your groin and also swelling and brusing in your testicle area. You may use ice 4-5 times daily for 15-20 minutes each time. Make sure that you place a barrier between you and the ice pack. To decrease the swelling, you may roll up a bath towel and place it vertically in between your thighs with your testicles resting on the towel. You will want to keep this area elevated as much as possible for several days following surgery.    Inguinal Hernia, Adult Muscles help keep everything in the body in its proper place. But if a weak spot in the muscles develops, something can poke through. That is called a hernia. When this happens in the lower part of the belly (abdomen), it is called an inguinal hernia. (It takes its name from a part of the body in this region called the inguinal canal.) A weak spot in the wall of muscles lets some fat or part of the small intestine bulge through. An inguinal hernia can develop at any age. Men get them more often than women. CAUSES  In adults, an inguinal hernia develops over time. It can be triggered by: Suddenly straining the muscles of the lower abdomen. Lifting heavy objects. Straining to have a bowel movement. Difficult bowel movements (constipation) can lead to this. Constant coughing. This may be caused by smoking or lung disease. Being overweight. Being pregnant. Working at a job that requires long periods of standing or heavy lifting. Having had an inguinal hernia before. One type can be an emergency situation. It is called a strangulated inguinal hernia. It  develops if part of the small intestine slips through the weak spot and cannot get back into the abdomen. The blood supply can be cut off. If that happens, part of the intestine may die. This situation requires emergency surgery. SYMPTOMS  Often, a small inguinal hernia has no symptoms. It is found when a healthcare provider does a physical exam. Larger hernias usually have symptoms.  In adults, symptoms may include: A lump in the groin. This is easier to see when the person is standing. It might disappear when lying down. In men, a lump in the scrotum. Pain or burning in the groin. This occurs especially when lifting, straining or coughing. A dull ache or feeling of pressure in the groin. Signs of a strangulated hernia can include: A bulge in the groin that becomes very painful and tender to the touch. A bulge that turns red or purple. Fever, nausea and vomiting. Inability to have a bowel movement or to pass gas. DIAGNOSIS  To decide if you have an inguinal hernia, a healthcare provider will probably do a physical examination. This will include asking questions about any symptoms you have noticed. The healthcare provider might feel the groin area and ask you to cough. If an inguinal hernia is felt, the healthcare provider may try to slide it back into the abdomen. Usually no other tests are needed. TREATMENT  Treatments can vary. The size of the hernia makes a difference. Options include:  Watchful waiting. This is often suggested if the hernia is small and you have had no symptoms. No medical procedure will be done unless symptoms develop. You will need to watch closely for symptoms. If any occur, contact your healthcare provider right away. Surgery. This is used if the hernia is larger or you have symptoms. Open surgery. This is usually an outpatient procedure (you will not stay overnight in a hospital). An cut (incision) is made through the skin in the groin. The hernia is put back inside  the abdomen. The weak area in the muscles is then repaired by herniorrhaphy or hernioplasty. Herniorrhaphy: in this type of surgery, the weak muscles are sewn back together. Hernioplasty: a patch or mesh is used to close the weak area in the abdominal wall. Laparoscopy. In this procedure, a surgeon makes small incisions. A thin tube with a tiny video camera (called a laparoscope) is put into the abdomen. The surgeon repairs the hernia with mesh by looking with the video camera and using two long instruments. HOME CARE INSTRUCTIONS  After surgery to repair an inguinal hernia: You will need to take pain medicine prescribed by your healthcare provider. Follow all directions carefully. You will need to take care of the wound from the incision. Your activity will be restricted for awhile. This will probably include no heavy lifting for several weeks. You also should not do anything too active for a few weeks. When you can return to work will depend on the type of job that you have. During "watchful waiting" periods, you should: Maintain a healthy weight. Eat a diet high in fiber (fruits, vegetables and whole grains). Drink plenty of fluids to avoid constipation. This means drinking enough water and other liquids to keep your urine clear or pale yellow. Do not lift heavy objects. Do not stand for long periods of time. Quit smoking. This should keep you from developing a frequent cough. SEEK MEDICAL CARE IF:  A bulge develops in your groin area. You feel pain, a burning sensation or pressure in the groin. This might be worse if you are lifting or straining. You develop a fever of more than 100.5 F (38.1 C). SEEK IMMEDIATE MEDICAL CARE IF:  Pain in the groin increases suddenly. A bulge in the groin gets bigger suddenly and does not go down. For men, there is sudden pain in the scrotum. Or, the size of the scrotum increases. A bulge in the groin area becomes red or purple and is painful to touch. You  have nausea or vomiting that does not go away. You feel your heart beating much faster than normal. You cannot have a bowel movement or pass gas. You develop a fever of more than 102.0 F (38.9 C).   This information is not intended to replace advice given to you by your health care provider. Make sure you discuss any questions you have with your health care provider.   Document Released: 07/04/2008 Document Revised: 05/10/2011 Document Reviewed: 08/19/2014 Elsevier Interactive Patient Education [employer].

## 2020-11-06 NOTE — Progress Notes (Signed)
Patient ID: [name]., male   DOB: 1985-09-15, 35 y.o.   MRN: [id number]  Chief Complaint: Left inguinal hernia  History of Present Illness [name]. is a 35 y.o. male with no history consistent with left inguinal hernia.  He actually felt a mass of an appendage on his left testicle and became concerned.  An ultrasound was obtained which coincidentally revealed a left inguinal hernia.  Looking back he may have had twinges of left groin pain at various times, following either coughing spells with COVID or a period of heavy lifting.  However he denies any consistent recurring bulge or pain associated with heavy lifting, coughing, sneezing etc.  He denies any issues with voiding or defecating or any associated pain with that form of straining.  However he does lift objects at work that are significantly heavy and he has concerns that this may become an issue for him.  Past Medical History Past Medical History:  Diagnosis Date   ADHD (attention deficit hyperactivity disorder)    Anxiety    Bipolar 1 disorder ([place])    Bipolar 1 disorder ([place]) 08/19/2014   Chronic back pain    Depression    GERD (gastroesophageal reflux disease)    Gout    Hyperlipidemia       Past Surgical History:  Procedure Laterality Date   APPENDECTOMY     KIDNEY STONE SURGERY      Allergies  Allergen Reactions   Cyclobenzaprine Other (See Comments)    Dizziness   Penicillin G Benzathine    Amoxicillin Rash    Current Outpatient Medications  Medication Sig Dispense Refill   albuterol (VENTOLIN HFA) 108 (90 Base) MCG/ACT inhaler Inhale 2 puffs into the lungs every 6 (six) hours as needed for wheezing or shortness of breath. 16 g 3   allopurinol (ZYLOPRIM) 300 MG tablet Take 1 tablet (300 mg total) by mouth daily. 90 tablet 4   clonazePAM (KLONOPIN) 0.5 MG tablet Take 1 tablet (0.5 mg total) by mouth 2 (two) times daily as needed for anxiety. 30 tablet 2   colchicine 0.6 MG tablet Take 0.6 mg by  mouth daily as needed.      esomeprazole (NEXIUM) 40 MG capsule Take 1 capsule (40 mg total) by mouth daily. 90 capsule 4   lisinopril (ZESTRIL) 40 MG tablet Take 1 tablet (40 mg total) by mouth daily. 90 tablet 4   metoprolol succinate (TOPROL-XL) 25 MG 24 hr tablet Take 1 tablet (25 mg total) by mouth daily. 90 tablet 4   nystatin (MYCOSTATIN/NYSTOP) powder Apply 1 application topically 3 (three) times daily. 15 g 4   fluconazole (DIFLUCAN) 150 MG tablet Take by mouth.     No current facility-administered medications for this visit.    Family History Family History  Problem Relation Age of Onset   Hyperlipidemia Father    Breast cancer Maternal Grandmother    Cancer Paternal Grandmother        pancreatic      Social History Social History   Tobacco Use   Smoking status: Never   Smokeless tobacco: Current    Types: Chew  Vaping Use   Vaping Use: Never used  Substance Use Topics   Alcohol use: Yes    Comment: 1 pint of liquor   Drug use: No      Review of Systems  Constitutional: Negative.   HENT: Negative.    Eyes: Negative.   Respiratory: Negative.    Cardiovascular: Negative.  Gastrointestinal: Negative.   Genitourinary: Negative.   Skin:  Positive for rash (Known intertriginous yeast rash, recently treated and improving).  Neurological: Negative.   Psychiatric/Behavioral: Negative.       Physical Exam Blood pressure 131/87, pulse 76, temperature 98 F (36.7 C), height '5\' 5"'$  (1.651 m), weight 233 lb (105.7 kg), SpO2 96 %. Last Weight  Most recent update: 11/06/2020  2:38 PM    Weight  105.7 kg (233 lb)             CONSTITUTIONAL: Well developed, and nourished, appropriately responsive and aware without distress.   EYES: Sclera non-icteric.   EARS, NOSE, MOUTH AND THROAT: Mask worn.  Hearing is intact to voice.  NECK: Trachea is midline, and there is no jugular venous distension.  LYMPH NODES:  Lymph nodes in the neck are not enlarged. RESPIRATORY:   Lungs are clear, and breath sounds are equal bilaterally. Normal respiratory effort without pathologic use of accessory muscles. CARDIOVASCULAR: Heart is regular in rate and rhythm. GI: The abdomen is well rounded, soft, nontender, and nondistended. There were no palpable masses. I did not appreciate hepatosplenomegaly. There were normal bowel sounds. GU: Definite left inguinal hernia.  There is a soft thickening of the adnexa of the left testes, not remarkably tender suggestive of inflammation.  No appreciable abnormality on the right testy, no appreciable right inguinal hernia. MUSCULOSKELETAL:  Symmetrical muscle tone appreciated in all four extremities.    SKIN: Skin turgor is normal. No pathologic skin lesions appreciated.  NEUROLOGIC:  Motor and sensation appear grossly normal.  Cranial nerves are grossly without defect. PSYCH:  Alert and oriented to person, place and time. Affect is appropriate for situation.  Data Reviewed I have personally reviewed what is currently available of the patient's imaging, recent labs and medical records.   Labs:  CBC Latest Ref Rng & Units 06/24/2020 03/27/2020 08/08/2019  WBC 3.4 - 10.8 x10E3/uL 7.1 10.8(H) 5.5  Hemoglobin 13.0 - 17.7 g/dL 15.7 15.6 17.6  Hematocrit 37.5 - 51.0 % 45.4 43.3 50.6  Platelets 150 - 450 x10E3/uL 105(L) 144(L) 176   CMP Latest Ref Rng & Units 06/24/2020 03/27/2020 08/08/2019  Glucose 65 - 99 mg/dL 112(H) 125(H) 83  BUN 6 - 20 mg/dL '10 8 9  '$ Creatinine 0.76 - 1.27 mg/dL 0.84 0.83 1.02  Sodium 134 - 144 mmol/L 138 134(L) 136  Potassium 3.5 - 5.2 mmol/L 3.9 4.3 5.2  Chloride 96 - 106 mmol/L 99 100 97  CO2 20 - 29 mmol/L '20 23 24  '$ Calcium 8.7 - 10.2 mg/dL 9.7 9.3 9.7  Total Protein 6.0 - 8.5 g/dL 6.8 7.9 7.3  Total Bilirubin 0.0 - 1.2 mg/dL 0.5 1.1 0.5  Alkaline Phos 44 - 121 IU/L 84 87 93  AST 0 - 40 IU/L 158(H) 250(H) 65(H)  ALT 0 - 44 IU/L 175(H) 152(H) 86(H)      Imaging:  Within last 24 hrs: No results  found.  Assessment    Left inguinal hernia Patient Active Problem List   Diagnosis Date Noted   Left inguinal hernia 10/29/2020   Hydrocele in adult 10/29/2020   Tinea cruris 10/29/2020   OSA (obstructive sleep apnea) 09/14/2020   Obesity 07/11/2019   EtOH dependence ([place]) 03/21/2015   Insomnia 03/21/2015   Hypertension 03/21/2015   Elevated transaminase level 08/19/2014   Mixed hyperlipidemia 08/19/2014   Chronic back pain 08/19/2014   Anxiety and depression 08/19/2014   ADHD (attention deficit hyperactivity disorder) 08/19/2014   GERD (gastroesophageal reflux  disease) 08/19/2014   Gout 08/19/2014    Plan    We discussed the role of left inguinal hernia, the things that could make his inguinal hernia more urgent, worst case scenarios reviewed. He would like to pull together some of the other medical issues are currently incompleted at this time and revisit in about a month to determine scheduling an operation for hernia repair.  Face-to-face time spent with the patient and accompanying care providers(if present) was 30 minutes, with more than 50% of the time spent counseling, educating, and coordinating care of the patient.    These notes generated with voice recognition software. I apologize for typographical errors.  Ronny Bacon M.D., FACS 11/06/2020, 5:15 PM

## 2020-11-17 NOTE — Telephone Encounter (Signed)
Spoke with patient.  He had to cancel his appointment tomorrow because he does not have his machine yet.  However I reached out to [facility] and they reached out to the patient and got him rescheduled for a set up next Tuesday.  Patient was then rescheduled for an initial autoPAP f/u Wed 11/23 @ 9:45 AM and he is aware to bring his machine & power cord.

## 2020-11-18 ENCOUNTER — Ambulatory Visit: Payer: Self-pay | Admitting: Neurology

## 2020-11-21 ENCOUNTER — Ambulatory Visit: Payer: BC Managed Care – PPO | Admitting: Urology

## 2020-11-21 ENCOUNTER — Encounter: Payer: Self-pay | Admitting: Urology

## 2020-11-21 ENCOUNTER — Other Ambulatory Visit: Payer: Self-pay

## 2020-11-21 VITALS — BP 123/80 | HR 86 | Ht 65.0 in | Wt 235.0 lb

## 2020-11-21 DIAGNOSIS — Z87438 Personal history of other diseases of male genital organs: Secondary | ICD-10-CM

## 2020-11-21 DIAGNOSIS — N433 Hydrocele, unspecified: Secondary | ICD-10-CM

## 2020-11-21 LAB — MICROSCOPIC EXAMINATION
Bacteria, UA: NONE SEEN
Epithelial Cells (non renal): NONE SEEN /hpf (ref 0–10)

## 2020-11-21 LAB — URINALYSIS, COMPLETE
Bilirubin, UA: NEGATIVE
Glucose, UA: NEGATIVE
Ketones, UA: NEGATIVE
Leukocytes,UA: NEGATIVE
Nitrite, UA: NEGATIVE
RBC, UA: NEGATIVE
Specific Gravity, UA: 1.025 (ref 1.005–1.030)
Urobilinogen, Ur: 0.2 mg/dL (ref 0.2–1.0)
pH, UA: 5.5 (ref 5.0–7.5)

## 2020-11-21 NOTE — Progress Notes (Signed)
11/21/2020 12:55 PM   [name] 03-Apr-1985 [id number]  Referring provider: Venita Lick, NP [address] [place],  [place] [address]  Chief Complaint  Patient presents with   Hypogonadism    HPI: [name] is a 35 y.o. male referred for evaluation of a hydrocele.  Seen in ED [facility] 10/08/2020 scrotal mass x2 weeks with pain Scrotal ultrasound showed a left inguinal hernia and sonographic evidence of left epididymitis Trace bilateral hydroceles Treated for left epididymitis Has seen Dr. Christian Mate for his left inguinal hernia and will be scheduled for hernia repair in the future His scrotal pain has resolved.  He has some left groin discomfort which he thinks is more related to his hernia No prior vasectomy   PMH: Past Medical History:  Diagnosis Date   ADHD (attention deficit hyperactivity disorder)    Anxiety    Bipolar 1 disorder ([place])    Bipolar 1 disorder ([place]) 08/19/2014   Chronic back pain    Depression    GERD (gastroesophageal reflux disease)    Gout    Hyperlipidemia     Surgical History: Past Surgical History:  Procedure Laterality Date   APPENDECTOMY     KIDNEY STONE SURGERY      Home Medications:  Allergies as of 11/21/2020       Reactions   Cyclobenzaprine Other (See Comments)   Dizziness   Penicillin G Benzathine    Amoxicillin Rash        Medication List        Accurate as of November 21, 2020 12:55 PM. If you have any questions, ask your nurse or doctor.          albuterol 108 (90 Base) MCG/ACT inhaler Commonly known as: VENTOLIN HFA Inhale 2 puffs into the lungs every 6 (six) hours as needed for wheezing or shortness of breath.   allopurinol 300 MG tablet Commonly known as: ZYLOPRIM Take 1 tablet (300 mg total) by mouth daily.   clonazePAM 0.5 MG tablet Commonly known as: KLONOPIN Take 1 tablet (0.5 mg total) by mouth 2 (two) times daily as needed for anxiety.   colchicine 0.6 MG tablet Take 0.6 mg by mouth daily  as needed.   esomeprazole 40 MG capsule Commonly known as: NexIUM Take 1 capsule (40 mg total) by mouth daily.   fluconazole 150 MG tablet Commonly known as: DIFLUCAN Take by mouth.   lisinopril 40 MG tablet Commonly known as: ZESTRIL Take 1 tablet (40 mg total) by mouth daily.   metoprolol succinate 25 MG 24 hr tablet Commonly known as: TOPROL-XL Take 1 tablet (25 mg total) by mouth daily.   nystatin powder Commonly known as: MYCOSTATIN/NYSTOP Apply 1 application topically 3 (three) times daily.        Allergies:  Allergies  Allergen Reactions   Cyclobenzaprine Other (See Comments)    Dizziness   Penicillin G Benzathine    Amoxicillin Rash    Family History: Family History  Problem Relation Age of Onset   Hyperlipidemia Father    Breast cancer Maternal Grandmother    Cancer Paternal Grandmother        pancreatic    Social History:  reports that he has never smoked. His smokeless tobacco use includes chew. He reports current alcohol use. He reports that he does not use drugs.   Physical Exam: BP 123/80   Pulse 86   Ht 5\' 5"  (1.651 m)   Wt 235 lb (106.6 kg)   BMI 39.11  kg/m   Constitutional:  Alert and oriented, No acute distress. HEENT: [place] AT, moist mucus membranes.  Trachea midline, no masses. Cardiovascular: No clubbing, cyanosis, or edema. Respiratory: Normal respiratory effort, no increased work of breathing. GI: Abdomen is soft, nontender, nondistended, no abdominal masses GU: Phallus without lesions.  Testes descended bilaterally without masses or tenderness.  No significant hydrocele present.  Left epididymis prominent but no discrete mass and nontender Psychiatric: Normal mood and affect.  Laboratory Data:  Urinalysis Pending  Pertinent Imaging: Scrotal ultrasound images were reviewed on [facility] and personally interpreted  Assessment & Plan:    1.  History left epididymitis Left epididymis prominent on today's exam but nontender  and no discrete mass.  No concern for malignancy on scrotal ultrasound Hydroceles were noted on ultrasound but not clinically evident Would not recommend any operative intervention Follow-up prn   Abbie Sons, MD  [facility] [address], [place], [place] [address] [phone]

## 2020-11-25 ENCOUNTER — Encounter: Payer: Self-pay | Admitting: Urology

## 2020-11-26 ENCOUNTER — Telehealth: Payer: Self-pay | Admitting: Neurology

## 2020-11-26 NOTE — Telephone Encounter (Signed)
Pt was scheduled her Initial CPAP visit on 01/21/21.  DME: [facility] Phone: [phone] Fax: [phone] Equipment issued: Lewayne Bunting on 11/25/20 Pt to be scheduled between 12/25/20-02/23/21

## 2020-12-02 ENCOUNTER — Ambulatory Visit: Payer: BC Managed Care – PPO | Admitting: Surgery

## 2020-12-02 ENCOUNTER — Encounter: Payer: Self-pay | Admitting: Surgery

## 2020-12-02 ENCOUNTER — Other Ambulatory Visit: Payer: Self-pay

## 2020-12-02 VITALS — BP 142/88 | HR 85 | Temp 98.4°F | Ht 65.0 in | Wt 239.8 lb

## 2020-12-02 DIAGNOSIS — K4090 Unilateral inguinal hernia, without obstruction or gangrene, not specified as recurrent: Secondary | ICD-10-CM | POA: Diagnosis not present

## 2020-12-02 NOTE — H&P (View-Only) (Signed)
Presents today to discuss proceeding with left inguinal hernia repair.  We reviewed his recent exam findings, and discussed in detail plan for robotic left inguinal hernia repair.  I discussed possibility of incarceration, strangulation, enlargement in size over time, and the need for emergency surgery in the face of these.  Also reviewed the techniques of reduction should incarceration occur, and when unsuccessful to present to the ED.  Also discussed that surgery risks include recurrence which can be up to 30% in the case of complex hernias, use of prosthetic materials (mesh) and the increased risk of infection and the possible need for re-operation and removal of mesh, possibility of post-op SBO or ileus, and the risks of general anesthetic including heart attack, stroke, sudden death or some reaction to anesthetic medications. The patient, and those present, appear to understand the risks, any and all questions were answered to the patient's satisfaction.  No guarantees were ever expressed or implied.

## 2020-12-02 NOTE — Progress Notes (Signed)
Presents today to discuss proceeding with left inguinal hernia repair.  We reviewed his recent exam findings, and discussed in detail plan for robotic left inguinal hernia repair.  I discussed possibility of incarceration, strangulation, enlargement in size over time, and the need for emergency surgery in the face of these.  Also reviewed the techniques of reduction should incarceration occur, and when unsuccessful to present to the ED.  Also discussed that surgery risks include recurrence which can be up to 30% in the case of complex hernias, use of prosthetic materials (mesh) and the increased risk of infection and the possible need for re-operation and removal of mesh, possibility of post-op SBO or ileus, and the risks of general anesthetic including heart attack, stroke, sudden death or some reaction to anesthetic medications. The patient, and those present, appear to understand the risks, any and all questions were answered to the patient's satisfaction.  No guarantees were ever expressed or implied.

## 2020-12-02 NOTE — Patient Instructions (Signed)
Our surgery scheduler Pamala Hurry will call you within 24-48 hours to get you scheduled. If you have not heard from her after 48 hours, please call our office. You will not need to get Covid tested before surgery and have the blue sheet available when she calls to write down important information.   If you have any concerns or questions, please feel free to call our office.   Laparoscopic Inguinal Hernia Repair, Adult Laparoscopic inguinal hernia repair is a surgical procedure to repair a small, weak spot in the groin muscles that allows fat or intestines from inside the abdomen to bulge out (inguinal hernia). This procedure may be planned, or it may be an emergency procedure. During the procedure, tissue that has bulged out is moved back into place, and the opening in the groin muscles is repaired. This is done through three small incisions in the abdomen. A thin tube with a light and camera on the end (laparoscope) is used to help perform the procedure. Tell a health care provider about: Any allergies you have. All medicines you are taking, including vitamins, herbs, eye drops, creams, and over-the-counter medicines. Any problems you or family members have had with anesthetic medicines. Any blood disorders you have. Any surgeries you have had. Any medical conditions you have. Whether you are pregnant or may be pregnant. What are the risks? Generally, this is a safe procedure. However, problems may occur, including: Infection. Bleeding. Allergic reactions to medicines. Damage to nearby structures or organs. Testicle damage or long-term pain and swelling of the scrotum, in males. Inability to completely empty the bladder (urinary retention). Blood clots. A collection of fluid that builds up under the skin (seroma). The hernia coming back (recurrence). What happens before the procedure? Staying hydrated Follow instructions from your health care provider about hydration, which may include: Up  to 2 hours before the procedure - you may continue to drink clear liquids, such as water, clear fruit juice, black coffee, and plain tea.  Eating and drinking restrictions Follow instructions from your health care provider about eating and drinking, which may include: 8 hours before the procedure - stop eating heavy meals or foods, such as meat, fried foods, or fatty foods. 6 hours before the procedure - stop eating light meals or foods, such as toast or cereal. 6 hours before the procedure - stop drinking milk or drinks that contain milk. 2 hours before the procedure - stop drinking clear liquids. Medicines Ask your health care provider about: Changing or stopping your regular medicines. This is especially important if you are taking diabetes medicines or blood thinners. Taking medicines such as aspirin and ibuprofen. These medicines can thin your blood. Do not take these medicines unless your health care provider tells you to take them. Taking over-the-counter medicines, vitamins, herbs, and supplements. General instructions Do not use any products that contain nicotine or tobacco for at least 4 weeks before the procedure, if possible. These products include cigarettes, chewing tobacco, and vaping devices, such as e-cigarettes. If you need help quitting, ask your health care provider. Ask your health care provider: How your surgery site will be marked. What steps will be taken to help prevent infection. These steps may include: Removing hair at the surgery site. Washing skin with a germ-killing soap. Taking antibiotic medicine. Plan to have a responsible adult take you home from the hospital or clinic. Plan to have a responsible adult care for you for the time you are told after you leave the hospital or clinic.  This is important. What happens during the procedure? An IV will be inserted into one of your veins. You will be given one or more of the following: A medicine to help you relax  (sedative). A medicine to make you fall asleep (general anesthetic). Three small incisions will be made in your abdomen. Your abdomen will be inflated with carbon dioxide gas to make the surgical area easier to see. A laparoscope and surgical instruments will be inserted through the incisions. The laparoscope will send images of the inside of your abdomen to a monitor in the room. Tissue that is bulging through the hernia may be removed or moved back into place. The hernia opening will be closed with a sheet of surgical mesh. The surgical instruments and laparoscope will be removed. Your incisions will be closed with stitches (sutures) and adhesive strips. A bandage (dressing) will be placed over your incisions. The procedure may vary among health care providers and hospitals. What happens after the procedure? Your blood pressure, heart rate, breathing rate, and blood oxygen level will be monitored until you leave the hospital or clinic. You will be given pain medicine as needed. You may continue to receive medicines and fluids through an IV. The IV will be removed after you can drink fluids. You will be encouraged to get up and move around and to take deep breaths frequently. If you were given a sedative during the procedure, it can affect you for several hours. Do not drive or operate machinery until your health care provider says that it is safe. Summary Laparoscopic inguinal hernia repair is a surgical procedure to repair a small, weak spot in the groin muscles that allows fat or intestines from inside the abdomen to bulge out (inguinal hernia). This procedure is done through three small incisions in the abdomen. A thin tube with a light and camera on the end (laparoscope) is used to help perform the procedure. After the procedure, you will be encouraged to get up and move around and to take deep breaths frequently. This information is not intended to replace advice given to you by your  health care provider. Make sure you discuss any questions you have with your health care provider. Document Revised: 10/16/2019 Document Reviewed: 10/16/2019 Elsevier Patient Education  2022 [employer].

## 2020-12-03 ENCOUNTER — Telehealth: Payer: Self-pay | Admitting: Surgery

## 2020-12-03 NOTE — Telephone Encounter (Signed)
Patient has been advised of Pre-Admission date/time, COVID Testing date and Surgery date.  Surgery Date: 12/17/20 Preadmission Testing Date: 12/08/20 (phone 1p-5p) Covid Testing Date: Not needed.    Patient has been made aware to call [phone], between 1-3:00pm the day before surgery, to find out what time to arrive for surgery.

## 2020-12-05 ENCOUNTER — Ambulatory Visit: Payer: Self-pay | Admitting: Surgery

## 2020-12-05 DIAGNOSIS — K4090 Unilateral inguinal hernia, without obstruction or gangrene, not specified as recurrent: Secondary | ICD-10-CM

## 2020-12-08 ENCOUNTER — Other Ambulatory Visit
Admission: RE | Admit: 2020-12-08 | Discharge: 2020-12-08 | Disposition: A | Discharge status: Home / Self Care | Payer: BC Managed Care – PPO | Source: Ambulatory Visit | Attending: Surgery | Admitting: Surgery

## 2020-12-08 ENCOUNTER — Other Ambulatory Visit: Payer: Self-pay

## 2020-12-08 HISTORY — DX: Sleep apnea, unspecified: G47.30

## 2020-12-08 HISTORY — DX: Essential (primary) hypertension: I10

## 2020-12-08 NOTE — Patient Instructions (Signed)
Your procedure is scheduled on: Wednesday December 17, 2020. Report to Day Surgery inside [facility] 2nd floor. To find out your arrival time please call [phone] between 1PM - 3PM on Tuesday December 16, 2020.  Remember: Instructions that are not followed completely may result in serious medical risk,  up to and including death, or upon the discretion of your surgeon and anesthesiologist your  surgery may need to be rescheduled.     _X__ 1. Do not eat food or drink fluids after midnight the night before your procedure.                 No chewing gum or hard candies.   __X__2.  On the morning of surgery brush your teeth with toothpaste and water, you                may rinse your mouth with mouthwash if you wish.  Do not swallow any toothpaste of mouthwash.     _X__ 3.  No Alcohol for 24 hours before or after surgery.   _X__ 4.  Do Not Smoke or use e-cigarettes For 24 Hours Prior to Your Surgery.                 Do not use any chewable tobacco products for at least 6 hours prior to                 Surgery.  _X__  5.  Do not use any recreational drugs (marijuana, cocaine, heroin, ecstasy, MDMA or other)                For at least one week prior to your surgery.  Combination of these drugs with anesthesia                May have life threatening results.  __X__ 6.  Notify your doctor if there is any change in your medical condition      (cold, fever, infections).     Do not wear jewelry, make-up, hairpins, clips or nail polish. Do not wear lotions, powders, or perfumes. You may wear deodorant. Do not shave 48 hours prior to surgery. Men may shave face and neck. Do not bring valuables to the hospital.    [facility] is not responsible for any belongings or valuables.  Contacts, dentures or bridgework may not be worn into surgery. Leave your suitcase in the car. After surgery it may be brought to your room. For patients admitted to the hospital, discharge  time is determined by your treatment team.   Patients discharged the day of surgery will not be allowed to drive home.   Make arrangements for someone to be with you for the first 24 hours of your Same Day Discharge.   __X__ Take these medicines the morning of surgery with A SIP OF WATER:    1. allopurinol (ZYLOPRIM) 300 MG   2. clonazePAM (KLONOPIN) 0.5 MG (if needed)  3. esomeprazole (NEXIUM) 40 MG   4. metoprolol succinate (TOPROL-XL) 25 MG  5.  6.  ____ Fleet Enema (as directed)   __X__ Use CHG Soap (or wipes) as directed  ____ Use Benzoyl Peroxide Gel as instructed  ____ Use inhalers on the day of surgery  ____ Stop metformin 2 days prior to surgery    ____ Take 1/2 of usual insulin dose the night before surgery. No insulin the morning          of surgery.   ____ Call your  PCP, cardiologist, or Pulmonologist if taking Coumadin/Plavix/aspirin and ask when to stop before your surgery.   __X__ One Week prior to surgery- Stop Anti-inflammatories such as Ibuprofen, Aleve, Advil, Motrin, meloxicam (MOBIC), diclofenac, etodolac, ketorolac, Toradol, Daypro, piroxicam, aspirin containing products like Excedrin, Goody's or BC powders. OK TO USE TYLENOL IF NEEDED   __X__ Stop supplements until after surgery.    ____ Bring C-Pap to the hospital.    If you have any questions regarding your pre-procedure instructions,  Please call Pre-admit Testing at [phone].

## 2020-12-12 ENCOUNTER — Other Ambulatory Visit: Payer: Self-pay

## 2020-12-12 ENCOUNTER — Encounter
Admission: RE | Admit: 2020-12-12 | Discharge: 2020-12-12 | Disposition: A | Discharge status: Home / Self Care | Payer: BC Managed Care – PPO | Source: Ambulatory Visit | Attending: Surgery | Admitting: Surgery

## 2020-12-12 DIAGNOSIS — I10 Essential (primary) hypertension: Secondary | ICD-10-CM | POA: Diagnosis not present

## 2020-12-12 DIAGNOSIS — K4090 Unilateral inguinal hernia, without obstruction or gangrene, not specified as recurrent: Secondary | ICD-10-CM | POA: Diagnosis not present

## 2020-12-12 LAB — COMPREHENSIVE METABOLIC PANEL
ALT: 97 U/L — ABNORMAL HIGH (ref 0–44)
AST: 97 U/L — ABNORMAL HIGH (ref 15–41)
Albumin: 4.5 g/dL (ref 3.5–5.0)
Alkaline Phosphatase: 75 U/L (ref 38–126)
Anion gap: 8 (ref 5–15)
BUN: 9 mg/dL (ref 6–20)
CO2: 27 mmol/L (ref 22–32)
Calcium: 9.5 mg/dL (ref 8.9–10.3)
Chloride: 101 mmol/L (ref 98–111)
Creatinine, Ser: 0.86 mg/dL (ref 0.61–1.24)
GFR, Estimated: >60 mL/min (ref >60)
Glucose, Bld: 115 mg/dL — ABNORMAL HIGH (ref 70–99)
Potassium: 4.3 mmol/L (ref 3.5–5.1)
Sodium: 136 mmol/L (ref 135–145)
Total Bilirubin: 0.9 mg/dL (ref 0.3–1.2)
Total Protein: 7.4 g/dL (ref 6.5–8.1)

## 2020-12-12 LAB — CBC WITH DIFFERENTIAL/PLATELET
Abs Immature Granulocytes: 0.02 10*3/uL (ref 0.00–0.07)
Basophils Absolute: 0.1 10*3/uL (ref 0.0–0.1)
Basophils Relative: 1 %
Eosinophils Absolute: 0.1 10*3/uL (ref 0.0–0.5)
Eosinophils Relative: 1 %
HCT: 45.5 % (ref 39.0–52.0)
Hemoglobin: 16.7 g/dL (ref 13.0–17.0)
Immature Granulocytes: 0 %
Lymphocytes Relative: 20 %
Lymphs Abs: 1.5 10*3/uL (ref 0.7–4.0)
MCH: 36 pg — ABNORMAL HIGH (ref 26.0–34.0)
MCHC: 36.7 g/dL — ABNORMAL HIGH (ref 30.0–36.0)
MCV: 98.1 fL (ref 80.0–100.0)
Monocytes Absolute: 0.7 10*3/uL (ref 0.1–1.0)
Monocytes Relative: 8 %
Neutro Abs: 5.5 10*3/uL (ref 1.7–7.7)
Neutrophils Relative %: 70 %
Platelets: 133 10*3/uL — ABNORMAL LOW (ref 150–400)
RBC: 4.64 MIL/uL (ref 4.22–5.81)
RDW: 12.1 % (ref 11.5–15.5)
WBC: 7.8 10*3/uL (ref 4.0–10.5)
nRBC: 0 % (ref 0.0–0.2)

## 2020-12-17 ENCOUNTER — Ambulatory Visit: Payer: BC Managed Care – PPO | Admitting: Anesthesiology

## 2020-12-17 ENCOUNTER — Other Ambulatory Visit: Payer: Self-pay | Admitting: Surgery

## 2020-12-17 ENCOUNTER — Encounter
Admission: RE | Disposition: A | Discharge status: Home / Self Care | Payer: Self-pay | Source: Home / Self Care | Attending: Surgery

## 2020-12-17 ENCOUNTER — Ambulatory Visit: Payer: BC Managed Care – PPO | Admitting: Urgent Care

## 2020-12-17 ENCOUNTER — Ambulatory Visit
Admission: RE | Admit: 2020-12-17 | Discharge: 2020-12-17 | Disposition: A | Discharge status: Home / Self Care | Payer: BC Managed Care – PPO | Attending: Surgery | Admitting: Surgery

## 2020-12-17 ENCOUNTER — Encounter: Payer: Self-pay | Admitting: Surgery

## 2020-12-17 ENCOUNTER — Other Ambulatory Visit: Payer: Self-pay

## 2020-12-17 DIAGNOSIS — F17220 Nicotine dependence, chewing tobacco, uncomplicated: Secondary | ICD-10-CM | POA: Diagnosis not present

## 2020-12-17 DIAGNOSIS — Z79899 Other long term (current) drug therapy: Secondary | ICD-10-CM | POA: Insufficient documentation

## 2020-12-17 DIAGNOSIS — Z888 Allergy status to other drugs, medicaments and biological substances status: Secondary | ICD-10-CM | POA: Diagnosis not present

## 2020-12-17 DIAGNOSIS — K4030 Unilateral inguinal hernia, with obstruction, without gangrene, not specified as recurrent: Secondary | ICD-10-CM

## 2020-12-17 DIAGNOSIS — K4090 Unilateral inguinal hernia, without obstruction or gangrene, not specified as recurrent: Secondary | ICD-10-CM

## 2020-12-17 DIAGNOSIS — Z880 Allergy status to penicillin: Secondary | ICD-10-CM | POA: Diagnosis not present

## 2020-12-17 DIAGNOSIS — D176 Benign lipomatous neoplasm of spermatic cord: Secondary | ICD-10-CM

## 2020-12-17 HISTORY — PX: INSERTION OF MESH: SHX5868

## 2020-12-17 SURGERY — HERNIORRHAPHY, INGUINAL, ROBOT-ASSISTED, LAPAROSCOPIC
Anesthesia: General | Laterality: Left

## 2020-12-17 MED ORDER — LACTATED RINGERS IV SOLN: INTRAVENOUS | Status: DC

## 2020-12-17 MED ORDER — KETAMINE HCL 10 MG/ML IJ SOLN
INTRAMUSCULAR | Status: DC | PRN
  Administered 2020-12-17: 30 mg via INTRAVENOUS

## 2020-12-17 MED ORDER — HYDROCODONE-ACETAMINOPHEN 5-325 MG PO TABS: 1.0000 | ORAL_TABLET | Freq: Four times a day (QID) | ORAL | 0 refills | Status: DC | PRN

## 2020-12-17 MED ORDER — FENTANYL CITRATE (PF) 100 MCG/2ML IJ SOLN
25.0000 ug | INTRAMUSCULAR | Status: DC | PRN
  Administered 2020-12-17: 50 ug via INTRAVENOUS

## 2020-12-17 MED ORDER — PHENYLEPHRINE HCL-NACL 20-0.9 MG/250ML-% IV SOLN
INTRAVENOUS | Status: AC
  Filled 2020-12-17: qty 250

## 2020-12-17 MED ORDER — KETAMINE HCL 50 MG/5ML IJ SOSY
PREFILLED_SYRINGE | INTRAMUSCULAR | Status: AC
  Filled 2020-12-17: qty 5

## 2020-12-17 MED ORDER — CHLORHEXIDINE GLUCONATE CLOTH 2 % EX PADS
6.0000 | MEDICATED_PAD | Freq: Once | CUTANEOUS | Status: AC
  Administered 2020-12-17: 6 via TOPICAL

## 2020-12-17 MED ORDER — ORAL CARE MOUTH RINSE: 15.0000 mL | Freq: Once | OROMUCOSAL | Status: AC

## 2020-12-17 MED ORDER — DEXMEDETOMIDINE (PRECEDEX) IN NS 20 MCG/5ML (4 MCG/ML) IV SYRINGE
PREFILLED_SYRINGE | INTRAVENOUS | Status: DC | PRN
  Administered 2020-12-17: 8 ug via INTRAVENOUS
  Administered 2020-12-17: 12 ug via INTRAVENOUS

## 2020-12-17 MED ORDER — PROMETHAZINE HCL 25 MG/ML IJ SOLN
6.2500 mg | INTRAMUSCULAR | Status: DC | PRN
Start: 2020-12-17 — End: 2020-12-17

## 2020-12-17 MED ORDER — KETOROLAC TROMETHAMINE 30 MG/ML IJ SOLN
INTRAMUSCULAR | Status: DC | PRN
  Administered 2020-12-17: 30 mg via INTRAVENOUS

## 2020-12-17 MED ORDER — IBUPROFEN 800 MG PO TABS: 800.0000 mg | ORAL_TABLET | Freq: Three times a day (TID) | ORAL | 0 refills | Status: DC | PRN

## 2020-12-17 MED ORDER — ROCURONIUM BROMIDE 100 MG/10ML IV SOLN
INTRAVENOUS | Status: DC | PRN
  Administered 2020-12-17: 20 mg via INTRAVENOUS
  Administered 2020-12-17: 60 mg via INTRAVENOUS

## 2020-12-17 MED ORDER — FENTANYL CITRATE (PF) 100 MCG/2ML IJ SOLN
INTRAMUSCULAR | Status: DC | PRN
  Administered 2020-12-17: 50 ug via INTRAVENOUS
  Administered 2020-12-17: 100 ug via INTRAVENOUS

## 2020-12-17 MED ORDER — LIDOCAINE HCL (PF) 2 % IJ SOLN
INTRAMUSCULAR | Status: AC
  Filled 2020-12-17: qty 5

## 2020-12-17 MED ORDER — CHLORHEXIDINE GLUCONATE 0.12 % MT SOLN
OROMUCOSAL | Status: AC
  Administered 2020-12-17: 15 mL via OROMUCOSAL
  Filled 2020-12-17: qty 15

## 2020-12-17 MED ORDER — CIPROFLOXACIN IN D5W 400 MG/200ML IV SOLN
INTRAVENOUS | Status: AC
  Filled 2020-12-17: qty 200

## 2020-12-17 MED ORDER — OXYCODONE HCL 5 MG PO TABS
ORAL_TABLET | ORAL | Status: AC
  Administered 2020-12-17: 5 mg via ORAL
  Filled 2020-12-17: qty 1

## 2020-12-17 MED ORDER — FENTANYL CITRATE (PF) 100 MCG/2ML IJ SOLN
INTRAMUSCULAR | Status: AC
  Filled 2020-12-17: qty 2

## 2020-12-17 MED ORDER — ONDANSETRON HCL 4 MG/2ML IJ SOLN
INTRAMUSCULAR | Status: DC | PRN
  Administered 2020-12-17: 4 mg via INTRAVENOUS

## 2020-12-17 MED ORDER — ONDANSETRON HCL 4 MG/2ML IJ SOLN
INTRAMUSCULAR | Status: AC
  Filled 2020-12-17: qty 2

## 2020-12-17 MED ORDER — KETOROLAC TROMETHAMINE 30 MG/ML IJ SOLN
INTRAMUSCULAR | Status: AC
  Filled 2020-12-17: qty 1

## 2020-12-17 MED ORDER — DEXAMETHASONE SODIUM PHOSPHATE 10 MG/ML IJ SOLN
INTRAMUSCULAR | Status: DC | PRN
  Administered 2020-12-17: 10 mg via INTRAVENOUS

## 2020-12-17 MED ORDER — DROPERIDOL 2.5 MG/ML IJ SOLN
0.6250 mg | Freq: Once | INTRAMUSCULAR | Status: DC | PRN
  Filled 2020-12-17: qty 0.25

## 2020-12-17 MED ORDER — PROPOFOL 10 MG/ML IV BOLUS
INTRAVENOUS | Status: AC
  Filled 2020-12-17: qty 20

## 2020-12-17 MED ORDER — OXYCODONE HCL 5 MG/5ML PO SOLN: 5.0000 mg | Freq: Once | ORAL | Status: AC | PRN

## 2020-12-17 MED ORDER — DEXAMETHASONE SODIUM PHOSPHATE 10 MG/ML IJ SOLN
INTRAMUSCULAR | Status: AC
  Filled 2020-12-17: qty 1

## 2020-12-17 MED ORDER — BUPIVACAINE-EPINEPHRINE 0.25% -1:200000 IJ SOLN
INTRAMUSCULAR | Status: DC | PRN
  Administered 2020-12-17: 50 mL

## 2020-12-17 MED ORDER — PROPOFOL 10 MG/ML IV BOLUS
INTRAVENOUS | Status: DC | PRN
Start: 2020-12-17 — End: 2020-12-17
  Administered 2020-12-17: 200 mg via INTRAVENOUS

## 2020-12-17 MED ORDER — DEXMEDETOMIDINE HCL IN NACL 200 MCG/50ML IV SOLN
INTRAVENOUS | Status: AC
  Filled 2020-12-17: qty 50

## 2020-12-17 MED ORDER — LIDOCAINE HCL (CARDIAC) PF 100 MG/5ML IV SOSY
PREFILLED_SYRINGE | INTRAVENOUS | Status: DC | PRN
  Administered 2020-12-17: 100 mg via INTRAVENOUS

## 2020-12-17 MED ORDER — BUPIVACAINE LIPOSOME 1.3 % IJ SUSP
INTRAMUSCULAR | Status: AC
  Filled 2020-12-17: qty 20

## 2020-12-17 MED ORDER — SUGAMMADEX SODIUM 500 MG/5ML IV SOLN
INTRAVENOUS | Status: AC
  Filled 2020-12-17: qty 5

## 2020-12-17 MED ORDER — CIPROFLOXACIN IN D5W 400 MG/200ML IV SOLN
400.0000 mg | INTRAVENOUS | Status: AC
  Administered 2020-12-17: 400 mg via INTRAVENOUS

## 2020-12-17 MED ORDER — MIDAZOLAM HCL 2 MG/2ML IJ SOLN
INTRAMUSCULAR | Status: DC | PRN
  Administered 2020-12-17: 2 mg via INTRAVENOUS

## 2020-12-17 MED ORDER — CHLORHEXIDINE GLUCONATE 0.12 % MT SOLN: 15.0000 mL | Freq: Once | OROMUCOSAL | Status: AC

## 2020-12-17 MED ORDER — OXYCODONE HCL 5 MG PO TABS: 5.0000 mg | ORAL_TABLET | Freq: Once | ORAL | Status: AC | PRN

## 2020-12-17 MED ORDER — SUGAMMADEX SODIUM 500 MG/5ML IV SOLN
INTRAVENOUS | Status: DC | PRN
  Administered 2020-12-17: 250 mg via INTRAVENOUS

## 2020-12-17 MED ORDER — BUPIVACAINE-EPINEPHRINE (PF) 0.25% -1:200000 IJ SOLN
INTRAMUSCULAR | Status: AC
  Filled 2020-12-17: qty 30

## 2020-12-17 MED ORDER — OXYCODONE-ACETAMINOPHEN 5-325 MG PO TABS: 1.0000 | ORAL_TABLET | ORAL | 0 refills | Status: DC | PRN

## 2020-12-17 MED ORDER — ACETAMINOPHEN 10 MG/ML IV SOLN: 1000.0000 mg | Freq: Once | INTRAVENOUS | Status: DC | PRN

## 2020-12-17 MED ORDER — MIDAZOLAM HCL 2 MG/2ML IJ SOLN
INTRAMUSCULAR | Status: AC
  Filled 2020-12-17: qty 2

## 2020-12-17 MED ORDER — ROCURONIUM BROMIDE 10 MG/ML (PF) SYRINGE
PREFILLED_SYRINGE | INTRAVENOUS | Status: AC
  Filled 2020-12-17: qty 10

## 2020-12-17 SURGICAL SUPPLY — 48 items
ADH SKN CLS APL DERMABOND .7 (GAUZE/BANDAGES/DRESSINGS) ×1
APL PRP STRL LF DISP 70% ISPRP (MISCELLANEOUS) ×2
BLADE CLIPPER SURG (BLADE) ×2 IMPLANT
CANNULA CAP OBTURATR AIRSEAL 8 (CAP) ×2 IMPLANT
CHLORAPREP W/TINT 26 (MISCELLANEOUS) ×4 IMPLANT
COVER TIP SHEARS 8 DVNC (MISCELLANEOUS) ×1 IMPLANT
COVER TIP SHEARS 8MM DA VINCI (MISCELLANEOUS) ×1
COVER WAND RF STERILE (DRAPES) ×2 IMPLANT
DEFOGGER SCOPE WARMER CLEARIFY (MISCELLANEOUS) ×2 IMPLANT
DERMABOND ADVANCED (GAUZE/BANDAGES/DRESSINGS) ×1
DERMABOND ADVANCED .7 DNX12 (GAUZE/BANDAGES/DRESSINGS) ×1 IMPLANT
DRAPE 3/4 80X56 (DRAPES) IMPLANT
DRAPE ARM DVNC X/XI (DISPOSABLE) ×3 IMPLANT
DRAPE COLUMN DVNC XI (DISPOSABLE) ×1 IMPLANT
DRAPE DA VINCI XI ARM (DISPOSABLE) ×3
DRAPE DA VINCI XI COLUMN (DISPOSABLE) ×1
ELECT REM PT RETURN 9FT ADLT (ELECTROSURGICAL) ×2
ELECTRODE REM PT RTRN 9FT ADLT (ELECTROSURGICAL) ×1 IMPLANT
GAUZE 4X4 16PLY ~~LOC~~+RFID DBL (SPONGE) ×2 IMPLANT
GLOVE SURG ORTHO LTX SZ7.5 (GLOVE) ×4 IMPLANT
GOWN STRL REUS W/ TWL LRG LVL3 (GOWN DISPOSABLE) ×2 IMPLANT
GOWN STRL REUS W/TWL LRG LVL3 (GOWN DISPOSABLE) ×4
GRASPER SUT TROCAR 14GX15 (MISCELLANEOUS) IMPLANT
IRRIGATION STRYKERFLOW (MISCELLANEOUS) IMPLANT
IRRIGATOR STRYKERFLOW (MISCELLANEOUS)
IV CATH ANGIO 14GX1.88 NO SAFE (IV SOLUTION) ×2 IMPLANT
IV NS 1000ML (IV SOLUTION)
IV NS 1000ML BAXH (IV SOLUTION) IMPLANT
KIT PINK PAD W/HEAD ARE REST (MISCELLANEOUS) ×2
KIT PINK PAD W/HEAD ARM REST (MISCELLANEOUS) ×1 IMPLANT
LABEL OR SOLS (LABEL) ×2 IMPLANT
MANIFOLD NEPTUNE II (INSTRUMENTS) ×2 IMPLANT
MESH 3DMAX LIGHT 4.1X6.2 LT LR (Mesh General) ×2 IMPLANT
NEEDLE HYPO 22GX1.5 SAFETY (NEEDLE) ×2 IMPLANT
NEEDLE INSUFFLATION 14GA 120MM (NEEDLE) ×2 IMPLANT
PACK LAP CHOLECYSTECTOMY (MISCELLANEOUS) ×2 IMPLANT
SEAL CANN UNIV 5-8 DVNC XI (MISCELLANEOUS) ×2 IMPLANT
SEAL XI 5MM-8MM UNIVERSAL (MISCELLANEOUS) ×2
SET TUBE FILTERED XL AIRSEAL (SET/KITS/TRAYS/PACK) ×2 IMPLANT
SOLUTION ELECTROLUBE (MISCELLANEOUS) ×2 IMPLANT
SUT MNCRL 4-0 (SUTURE) ×2
SUT MNCRL 4-0 27XMFL (SUTURE) ×1
SUT VIC AB 0 CT2 27 (SUTURE) ×2 IMPLANT
SUT VLOC 90 2/L VL 12 GS22 (SUTURE) IMPLANT
SUT VLOC 90 S/L VL9 GS22 (SUTURE) ×2 IMPLANT
SUTURE MNCRL 4-0 27XMF (SUTURE) ×1 IMPLANT
TROCAR Z-THREAD FIOS 11X100 BL (TROCAR) IMPLANT
WATER STERILE IRR 500ML POUR (IV SOLUTION) IMPLANT

## 2020-12-17 NOTE — Discharge Instructions (Signed)

## 2020-12-17 NOTE — Transfer of Care (Signed)
Immediate Anesthesia Transfer of Care Note  Patient: [name].  Procedure(s) Performed: XI ROBOTIC ASSISTED INGUINAL HERNIA (Left)  Patient Location: PACU  Anesthesia Type:General  Level of Consciousness: drowsy and patient cooperative  Airway & Oxygen Therapy: Patient Spontanous Breathing and Patient connected to face mask oxygen  Post-op Assessment: Report given to RN and Post -op Vital signs reviewed and stable  Post vital signs: Reviewed and stable  Last Vitals:  Vitals Value Taken Time  BP 116/89 12/17/20 1015  Temp    Pulse 78 12/17/20 1015  Resp 16 12/17/20 1015  SpO2 100 % 12/17/20 1015  Vitals shown include unvalidated device data.  Last Pain:  Vitals:   12/17/20 0728  PainSc: 0-No pain         Complications: No notable events documented.

## 2020-12-17 NOTE — Interval H&P Note (Signed)
History and Physical Interval Note:  12/17/2020 8:25 AM  [name].  has presented today for surgery, with the diagnosis of left inguinal hernia.  The various methods of treatment have been discussed with the patient and family. After consideration of risks, benefits and other options for treatment, the patient has consented to  Procedure(s): XI ROBOTIC [place] (Left) as a surgical intervention.  The patient's history has been reviewed, patient examined, no change in status, stable for surgery.  I have reviewed the patient's chart and labs.  Questions were answered to the patient's satisfaction.   Left side is marked.   Ronny Bacon

## 2020-12-17 NOTE — Progress Notes (Signed)
Informed Dr [name] that patient requested Oxycodone rather than Norco/Vicodin as he reports an aversion resulting in itching. Awaiting directive.

## 2020-12-17 NOTE — Anesthesia Preprocedure Evaluation (Addendum)
Anesthesia Evaluation  Patient identified by MRN, date of birth, ID band Patient awake    Reviewed: Allergy & Precautions, NPO status , Patient's Chart, lab work & pertinent test results, reviewed documented beta blocker date and time   Airway Mallampati: I  TM Distance: >3 FB Neck ROM: Full    Dental no notable dental hx.    Pulmonary sleep apnea ,    Pulmonary exam normal        Cardiovascular Exercise Tolerance: Good hypertension, Pt. on medications and Pt. on home beta blockers Normal cardiovascular exam Rhythm:Regular Rate:Normal     Neuro/Psych PSYCHIATRIC DISORDERS Anxiety Depression negative neurological ROS     GI/Hepatic Neg liver ROS, GERD  Controlled and Medicated,left inguinal hernia   Endo/Other  Obese  Renal/GU negative Renal ROS  negative genitourinary   Musculoskeletal  (+) Arthritis ,  Gout Chronic Back Pain   Abdominal (+) + obese,   Peds negative pediatric ROS (+)  Hematology negative hematology ROS (+)   Anesthesia Other Findings   Reproductive/Obstetrics negative OB ROS                            Anesthesia Physical Anesthesia Plan  ASA: 3  Anesthesia Plan: General ETT   Post-op Pain Management:    Induction: Intravenous  PONV Risk Score and Plan: 4 or greater and Ondansetron, Dexamethasone, Midazolam and Propofol infusion  Airway Management Planned: Oral ETT  Additional Equipment:   Intra-op Plan:   Post-operative Plan: Extubation in OR  Informed Consent: I have reviewed the patients History and Physical, chart, labs and discussed the procedure including the risks, benefits and alternatives for the proposed anesthesia with the patient or authorized representative who has indicated his/her understanding and acceptance.     Dental advisory given  Plan Discussed with: Anesthesiologist, CRNA and Surgeon  Anesthesia Plan Comments: (Patient consented  for risks of anesthesia including but not limited to:  - adverse reactions to medications - damage to eyes, teeth, lips or other oral mucosa - nerve damage due to positioning  - sore throat or hoarseness - Damage to heart, brain, nerves, lungs, other parts of body or loss of life  Patient voiced understanding.)       Anesthesia Quick Evaluation

## 2020-12-17 NOTE — Anesthesia Postprocedure Evaluation (Signed)
Anesthesia Post Note  Patient: [name].  Procedure(s) Performed: XI ROBOTIC ASSISTED INGUINAL HERNIA (Left) INSERTION OF MESH (Left)  Patient location during evaluation: PACU Anesthesia Type: General Level of consciousness: awake and alert Pain management: pain level controlled Vital Signs Assessment: post-procedure vital signs reviewed and stable Respiratory status: spontaneous breathing, nonlabored ventilation and respiratory function stable Cardiovascular status: blood pressure returned to baseline and stable Postop Assessment: no apparent nausea or vomiting Anesthetic complications: no   No notable events documented.   Last Vitals:  Vitals:   12/17/20 1044 12/17/20 1053  BP: 115/88 (!) 126/91  Pulse: 88 91  Resp: 17 16  Temp: (!) 36.4 C (!) 36.3 C  SpO2: 97% 98%    Last Pain:  Vitals:   12/17/20 1053  TempSrc: Tympanic  PainSc: [place]

## 2020-12-17 NOTE — Anesthesia Procedure Notes (Signed)
Procedure Name: Intubation Date/Time: 12/17/2020 8:43 AM Performed by: Jonna Clark, CRNA Pre-anesthesia Checklist: Patient identified, Patient being monitored, Timeout performed, Emergency Drugs available and Suction available Patient Re-evaluated:Patient Re-evaluated prior to induction Oxygen Delivery Method: Circle system utilized Preoxygenation: Pre-oxygenation with 100% oxygen Induction Type: IV induction Ventilation: Mask ventilation without difficulty and Two handed mask ventilation required Laryngoscope Size: Mac and 3 Grade View: Grade III Tube type: Oral Tube size: 7.5 mm Number of attempts: 1 Airway Equipment and Method: Stylet Placement Confirmation: ETT inserted through vocal cords under direct vision, positive ETCO2 and breath sounds checked- equal and bilateral Secured at: 22 cm Tube secured with: Tape Dental Injury: Teeth and Oropharynx as per pre-operative assessment

## 2020-12-17 NOTE — Op Note (Signed)
Robotic assisted Laparoscopic Transabdominal left inguinal Hernia Repair with Mesh       Pre-operative Diagnosis: Left inguinal Hernia   Post-operative Diagnosis: Same, with large cord lipoma.   Procedure: Robotic assisted Laparoscopic  repair of left inguinal hernia(s)   Surgeon: Ronny Bacon, M.D., FACS   Anesthesia: GETA   Findings: Left inguinal hernia, no evidence of right sided hernia.         Procedure Details  The patient was seen again in the Holding Room. The benefits, complications, treatment options, and expected outcomes were discussed with the patient. The risks of bleeding, infection, recurrence of symptoms, failure to resolve symptoms, recurrence of hernia, ischemic orchitis, chronic pain syndrome or neuroma, were reviewed again. The likelihood of improving the patient's symptoms with return to their baseline status is good.  The patient and/or family concurred with the proposed plan, giving informed consent.  The patient was taken to Operating Room, identified  and the procedure verified as Laparoscopic Inguinal Hernia Repair. Laterality confirmed.  A Time Out was held and the above information confirmed.   Prior to the induction of general anesthesia, antibiotic prophylaxis was administered. VTE prophylaxis was in place. General endotracheal anesthesia was then administered and tolerated well. After the induction, the abdomen was prepped with Chloraprep and draped in the sterile fashion. The patient was positioned in the supine position.   After local infiltration of quarter percent Marcaine with epinephrine, stab incision was made left upper quadrant.  On the left at Palmer's point, the Veress needle is passed with sensation of the layers to penetrate the abdominal wall and into the peritoneum.  Saline drop test is confirmed peritoneal placement.  Insufflation is initiated with carbon dioxide to pressures of 15 mmHg. An 8.5 mm port is placed to the left off of the  midline, with blunt tipped trocar.  Pneumoperitoneum maintained w/o HD changes using the AirSeal to pressures of 15 mm Hg with CO2. No evidence of bowel injuries.  Two 8.5 mm ports placed under direct vision in each upper quadrant. The laparoscopy revealed a small left indirect defect(s).   The robot was brought ot the table and docked in the standard fashion, no collision between arms was observed. Instruments were kept under direct view at all times. For left inguinal hernia repair,  I developed a peritoneal flap. The sac(s) and associated large lipoma was reduced and dissected free from adjacent structures. We preserved the vas and the vessels, and visualized them to their convergence and beyond in the retroperitoneum. Once dissection was completed a large left sided BARD 3D Light mesh was placed and secured at three points with interrupted 0 Vicryl to the pubic tubercle and anteriorly.   There was good coverage of the direct, indirect and femoral spaces.  Second look revealed no complications or injuries. The flap was then closed with 2-0 V-lock suture.  Peritoneal closure without defects.   Once assuring that hemostasis was adequate, all needles/sponges removed, and the robot was undocked.  The ports were removed, the abdomen desulflated.  4-0 subcuticular Monocryl was used at all skin edges. Dermabond was placed.  Patient tolerated the procedure well. There were no complications. He was taken to the recovery room in stable condition.           Ronny Bacon, M.D., FACS 12/17/2020, 10:16 AM

## 2020-12-18 ENCOUNTER — Encounter: Payer: Self-pay | Admitting: Nurse Practitioner

## 2020-12-26 ENCOUNTER — Other Ambulatory Visit: Payer: Self-pay

## 2020-12-26 ENCOUNTER — Ambulatory Visit (INDEPENDENT_AMBULATORY_CARE_PROVIDER_SITE_OTHER): Payer: BC Managed Care – PPO | Admitting: Nurse Practitioner

## 2020-12-26 ENCOUNTER — Encounter: Payer: Self-pay | Admitting: Nurse Practitioner

## 2020-12-26 VITALS — BP 121/77 | HR 76 | Temp 98.5°F | Ht 65.0 in | Wt 230.8 lb

## 2020-12-26 DIAGNOSIS — E782 Mixed hyperlipidemia: Secondary | ICD-10-CM

## 2020-12-26 DIAGNOSIS — R7301 Impaired fasting glucose: Secondary | ICD-10-CM

## 2020-12-26 DIAGNOSIS — F10282 Alcohol dependence with alcohol-induced sleep disorder: Secondary | ICD-10-CM | POA: Diagnosis not present

## 2020-12-26 DIAGNOSIS — F5104 Psychophysiologic insomnia: Secondary | ICD-10-CM

## 2020-12-26 DIAGNOSIS — R7989 Other specified abnormal findings of blood chemistry: Secondary | ICD-10-CM

## 2020-12-26 DIAGNOSIS — Z0000 Encounter for general adult medical examination without abnormal findings: Secondary | ICD-10-CM

## 2020-12-26 DIAGNOSIS — Z8719 Personal history of other diseases of the digestive system: Secondary | ICD-10-CM

## 2020-12-26 DIAGNOSIS — M1A0720 Idiopathic chronic gout, left ankle and foot, without tophus (tophi): Secondary | ICD-10-CM

## 2020-12-26 DIAGNOSIS — F419 Anxiety disorder, unspecified: Secondary | ICD-10-CM | POA: Diagnosis not present

## 2020-12-26 DIAGNOSIS — E6609 Other obesity due to excess calories: Secondary | ICD-10-CM

## 2020-12-26 DIAGNOSIS — K219 Gastro-esophageal reflux disease without esophagitis: Secondary | ICD-10-CM

## 2020-12-26 DIAGNOSIS — G4733 Obstructive sleep apnea (adult) (pediatric): Secondary | ICD-10-CM

## 2020-12-26 DIAGNOSIS — F32A Depression, unspecified: Secondary | ICD-10-CM

## 2020-12-26 DIAGNOSIS — Z9889 Other specified postprocedural states: Secondary | ICD-10-CM

## 2020-12-26 DIAGNOSIS — E559 Vitamin D deficiency, unspecified: Secondary | ICD-10-CM

## 2020-12-26 DIAGNOSIS — I10 Essential (primary) hypertension: Secondary | ICD-10-CM

## 2020-12-26 DIAGNOSIS — Z6838 Body mass index (BMI) 38.0-38.9, adult: Secondary | ICD-10-CM

## 2020-12-26 MED ORDER — ESOMEPRAZOLE MAGNESIUM 40 MG PO CPDR: 40.0000 mg | DELAYED_RELEASE_CAPSULE | Freq: Every day | ORAL | 4 refills | Status: DC

## 2020-12-26 NOTE — Patient Instructions (Signed)

## 2020-12-26 NOTE — Progress Notes (Signed)
BP 121/77   Pulse 76   Temp 98.5 F (36.9 C) (Oral)   Ht 5\' 5"  (1.651 m)   Wt 230 lb 12.8 oz (104.7 kg)   SpO2 96%   BMI 38.41 kg/m    Subjective:    Patient ID: [name]., male    DOB: 17-Jul-1985, 35 y.o.   MRN: [id number]  HPI: [name]. is a 35 y.o. male presenting on 12/26/2020 for comprehensive medical examination. Current medical complaints include:none  He currently lives with: wife Interim Problems from his last visit: no   HYPERTENSION / HYPERLIPIDEMIA Continues on Metoprolol XL 25 MG and Lisinopril 40 MG daily. Stopped taking Lopid and is working on diet and exercise.  Has history of gout, taking Allopurinol 300 MG, has Colchicine 0.6 MG PRN -- has not had flare in years.   For GERD takes Nexium which he reports offers a lot of benefit. Satisfied with current treatment? yes Duration of hypertension: chronic BP monitoring frequency: not checking BP range:  BP medication side effects: no Duration of hyperlipidemia: chronic Aspirin: no Recent stressors: no Recurrent headaches: no Visual changes: no Palpitations: no Dyspnea: no Chest pain: no Lower extremity edema: no Dizzy/lightheaded: no  ALCOHOL ABUSE: Wine during week occasionally + occasional drinks on weekend, has cut back a lot.  Reports that he has been tolerating cut back without withdrawal symptoms.  Had been going to [place] groups in person and has support people to talk to.  Recent LFT 97/97.   DEPRESSION Takes Klonopin minimally, has not used in quite awhile.  Recently had sleep testing and noted to have mild obstructive sleep apnea -- got CPAP which he reports is not working well yet -- recently had inguinal left hernia repair with Dr. Christian Mate on 12/17/20 and has not really had the chance to try CPAP yet. Mood status: stable Satisfied with current treatment?: yes Symptom severity: mild  Duration of current treatment : chronic Side effects: no Medication compliance: good  compliance Psychotherapy/counseling: none Previous psychiatric medications: Klonopin Depressed mood: no Anxious mood: yes Anhedonia: no Significant weight loss or gain: no Insomnia: yes hard to fall asleep Fatigue: no Feelings of worthlessness or guilt: no Impaired concentration/indecisiveness: no Suicidal ideations: no Hopelessness: no Crying spells: no Depression screen [facility] 2/9 12/26/2020 06/24/2020 08/08/2019 07/11/2019 02/26/2019  Decreased Interest 0 1 0 0 0  Down, Depressed, Hopeless 0 1 0 0 0  PHQ - 2 Score 0 2 0 0 0  Altered sleeping 0 3 0 0 1  Tired, decreased energy 1 3 0 1 1  Change in appetite 0 3 0 0 1  Feeling bad or failure about yourself  0 2 0 0 0  Trouble concentrating 0 0 0 0 0  Moving slowly or fidgety/restless 0 3 0 0 0  Suicidal thoughts 0 0 0 0 0  PHQ-9 Score 1 16 0 1 3  Difficult doing work/chores - - Not difficult at all Not difficult at all Not difficult at all  Some recent data might be hidden    Functional Status Survey: Is the patient deaf or have difficulty hearing?: No Does the patient have difficulty seeing, even when wearing glasses/contacts?: No Does the patient have difficulty concentrating, remembering, or making decisions?: No Does the patient have difficulty walking or climbing stairs?: No Does the patient have difficulty dressing or bathing?: No Does the patient have difficulty doing errands alone such as visiting a doctor's office or shopping?: No  FALL RISK:  Fall Risk  12/26/2020 08/08/2019 02/05/2019 03/21/2015  Falls in the past year? 0 0 0 No  Number falls in past yr: 0 0 0 -  Injury with Fall? 0 0 0 -  Risk for fall due to : No Fall Risks - - -  Follow up Falls prevention discussed Falls evaluation completed Falls evaluation completed -   Advanced Directives <no information>  Past Medical History:  Past Medical History:  Diagnosis Date   ADHD (attention deficit hyperactivity disorder)    Anxiety    Bipolar 1 disorder ([place])     Bipolar 1 disorder ([place]) 08/19/2014   Chronic back pain    Depression    GERD (gastroesophageal reflux disease)    Gout    Hyperlipidemia    Hypertension    Sleep apnea     Surgical History:  Past Surgical History:  Procedure Laterality Date   APPENDECTOMY     INSERTION OF MESH Left 12/17/2020   Procedure: INSERTION OF MESH;  Surgeon: Ronny Bacon, MD;  Location: [facility] ORS;  Service: General;  Laterality: Left;   KIDNEY STONE SURGERY      Medications:  Current Outpatient Medications on File Prior to Visit  Medication Sig   allopurinol (ZYLOPRIM) 300 MG tablet Take 1 tablet (300 mg total) by mouth daily.   aspirin-acetaminophen-caffeine (EXCEDRIN MIGRAINE) 250-250-65 MG tablet Take 2 tablets by mouth every 6 (six) hours as needed for headache.   Carboxymethylcellul-Glycerin (LUBRICATING EYE DROPS OP) Place 1 drop into both eyes 3 (three) times daily as needed (dry eyes).   clonazePAM (KLONOPIN) 0.5 MG tablet Take 1 tablet (0.5 mg total) by mouth 2 (two) times daily as needed for anxiety.   colchicine 0.6 MG tablet Take 0.6 mg by mouth daily as needed (gout flare).   ibuprofen (ADVIL) 800 MG tablet Take 1 tablet (800 mg total) by mouth every 8 (eight) hours as needed.   lisinopril (ZESTRIL) 40 MG tablet Take 1 tablet (40 mg total) by mouth daily.   metoprolol succinate (TOPROL-XL) 25 MG 24 hr tablet Take 1 tablet (25 mg total) by mouth daily.   oxyCODONE-acetaminophen (PERCOCET) 5-325 MG tablet Take 1-2 tablets by mouth every 4 (four) hours as needed for severe pain.   No current facility-administered medications on file prior to visit.    Allergies:  Allergies  Allergen Reactions   Penicillin G Benzathine     Unknown reaction    Amoxicillin Rash    Social History:  Social History   Socioeconomic History   Marital status: Married    Spouse name: Not on file   Number of children: Not on file   Years of education: Not on file   Highest education level: Not on file   Occupational History   Not on file  Tobacco Use   Smoking status: Never   Smokeless tobacco: Current    Types: Chew  Vaping Use   Vaping Use: Never used  Substance and Sexual Activity   Alcohol use: Yes    Alcohol/week: 1.0 - 2.0 standard drink    Types: 1 - 2 Glasses of wine per week   Drug use: No   Sexual activity: Yes  Other Topics Concern   Not on file  Social History Narrative   Not on file   Social Determinants of Health   Financial Resource Strain: Low Risk    Difficulty of Paying Living Expenses: Not hard at all  Food Insecurity: No Food Insecurity   Worried About Running Out of  Food in the Last Year: Never true   [place] in the Last Year: Never true  Transportation Needs: No Transportation Needs   Lack of Transportation (Medical): No   Lack of Transportation (Non-Medical): No  Physical Activity: Insufficiently Active   Days of Exercise per Week: 2 days   Minutes of Exercise per Session: 20 min  Stress: No Stress Concern Present   Feeling of Stress : Only a little  Social Connections: Moderately Isolated   Frequency of Communication with Friends and Family: Three times a week   Frequency of Social Gatherings with Friends and Family: Three times a week   Attends Religious Services: Never   Active Member of Clubs or Organizations: No   Attends Music therapist: Never   Marital Status: Married  Human resources officer Violence: Not At Risk   Fear of Current or Ex-Partner: No   Emotionally Abused: No   Physically Abused: No   Sexually Abused: No   Social History   Tobacco Use  Smoking Status Never  Smokeless Tobacco Current   Types: Chew   Social History   Substance and Sexual Activity  Alcohol Use Yes   Alcohol/week: 1.0 - 2.0 standard drink   Types: 1 - 2 Glasses of wine per week    Family History:  Family History  Problem Relation Age of Onset   Hyperlipidemia Father    Breast cancer Maternal Grandmother    Cancer Paternal  Grandmother        pancreatic    Past medical history, surgical history, medications, allergies, family history and social history reviewed with patient today and changes made to appropriate areas of the chart.   Review of Systems - negative All other ROS negative except what is listed above and in the HPI.      Objective:    BP 121/77   Pulse 76   Temp 98.5 F (36.9 C) (Oral)   Ht 5\' 5"  (1.651 m)   Wt 230 lb 12.8 oz (104.7 kg)   SpO2 96%   BMI 38.41 kg/m   Wt Readings from Last 3 Encounters:  12/26/20 230 lb 12.8 oz (104.7 kg)  12/08/20 235 lb (106.6 kg)  12/02/20 239 lb 12.8 oz (108.8 kg)    Physical Exam Vitals and nursing note reviewed.  Constitutional:      General: He is awake. He is not in acute distress.    Appearance: He is well-developed and well-groomed. He is obese. He is not ill-appearing.  HENT:     Head: Normocephalic and atraumatic.     Right Ear: Hearing, tympanic membrane, ear canal and external ear normal. No drainage.     Left Ear: Hearing, tympanic membrane, ear canal and external ear normal. No drainage.     Nose: Nose normal.     Mouth/Throat:     Pharynx: Uvula midline.  Eyes:     General: Lids are normal.        Right eye: No discharge.        Left eye: No discharge.     Extraocular Movements: Extraocular movements intact.     Conjunctiva/sclera: Conjunctivae normal.     Pupils: Pupils are equal, round, and reactive to light.     Visual Fields: Right eye visual fields normal and left eye visual fields normal.  Neck:     Thyroid: No thyromegaly.     Vascular: No carotid bruit or JVD.     Trachea: Trachea normal.  Cardiovascular:  Rate and Rhythm: Normal rate and regular rhythm.     Heart sounds: Normal heart sounds, S1 normal and S2 normal. No murmur heard.   No gallop.  Pulmonary:     Effort: Pulmonary effort is normal. No accessory muscle usage or respiratory distress.     Breath sounds: Normal breath sounds.  Abdominal:      General: Bowel sounds are normal.     Palpations: Abdomen is soft. There is no hepatomegaly or splenomegaly.     Tenderness: There is no abdominal tenderness.  Musculoskeletal:        General: Normal range of motion.     Cervical back: Normal range of motion and neck supple.     Right lower leg: No edema.     Left lower leg: No edema.  Lymphadenopathy:     Head:     Right side of head: No submental, submandibular, tonsillar, preauricular or posterior auricular adenopathy.     Left side of head: No submental, submandibular, tonsillar, preauricular or posterior auricular adenopathy.     Cervical: No cervical adenopathy.  Skin:    General: Skin is warm and dry.     Capillary Refill: Capillary refill takes less than 2 seconds.     Findings: No rash.  Neurological:     Mental Status: He is alert and oriented to person, place, and time.     Gait: Gait is intact.     Deep Tendon Reflexes: Reflexes are normal and symmetric.     Reflex Scores:      Brachioradialis reflexes are 2+ on the right side and 2+ on the left side.      Patellar reflexes are 2+ on the right side and 2+ on the left side. Psychiatric:        Attention and Perception: Attention normal.        Mood and Affect: Mood normal.        Speech: Speech normal.        Behavior: Behavior normal. Behavior is cooperative.        Thought Content: Thought content normal.        Cognition and Memory: Cognition normal.        Judgment: Judgment normal.   Results for orders placed or performed during the hospital encounter of 12/12/20  CBC WITH DIFFERENTIAL  Result Value Ref Range   WBC 7.8 4.0 - 10.5 K/uL   RBC 4.64 4.22 - 5.81 MIL/uL   Hemoglobin 16.7 13.0 - 17.0 g/dL   HCT 45.5 39.0 - 52.0 %   MCV 98.1 80.0 - 100.0 fL   MCH 36.0 (H) 26.0 - 34.0 pg   MCHC 36.7 (H) 30.0 - 36.0 g/dL   RDW 12.1 11.5 - 15.5 %   Platelets 133 (L) 150 - 400 K/uL   nRBC 0.0 0.0 - 0.2 %   Neutrophils Relative % 70 %   Neutro Abs 5.5 1.7 - 7.7 K/uL    Lymphocytes Relative 20 %   Lymphs Abs 1.5 0.7 - 4.0 K/uL   Monocytes Relative 8 %   Monocytes Absolute 0.7 0.1 - 1.0 K/uL   Eosinophils Relative 1 %   Eosinophils Absolute 0.1 0.0 - 0.5 K/uL   Basophils Relative 1 %   Basophils Absolute 0.1 0.0 - 0.1 K/uL   Immature Granulocytes 0 %   Abs Immature Granulocytes 0.02 0.00 - 0.07 K/uL  Comprehensive metabolic panel  Result Value Ref Range   Sodium 136 135 - 145 mmol/L   Potassium  4.3 3.5 - 5.1 mmol/L   Chloride 101 98 - 111 mmol/L   CO2 27 22 - 32 mmol/L   Glucose, Bld 115 (H) 70 - 99 mg/dL   BUN 9 6 - 20 mg/dL   Creatinine, Ser 0.86 0.61 - 1.24 mg/dL   Calcium 9.5 8.9 - 10.3 mg/dL   Total Protein 7.4 6.5 - 8.1 g/dL   Albumin 4.5 3.5 - 5.0 g/dL   AST 97 (H) 15 - 41 U/L   ALT 97 (H) 0 - 44 U/L   Alkaline Phosphatase 75 38 - 126 U/L   Total Bilirubin 0.9 0.3 - 1.2 mg/dL   GFR, Estimated >60 >60 mL/min   Anion gap 8 5 - 15      Assessment & Plan:   Problem List Items Addressed This Visit       Cardiovascular and Mediastinum   Hypertension    Chronic, ongoing with BP at goal on exam today.  Will continue current medication regimen and adjust as needed, refills sent up to date.  Check BP at home at least 3 mornings a week.  Continue cutting back on alcohol use.  Focus on DASH diet recommended.  Return in 6 months for follow-up.  CMP, TSH, and CBC today.      Relevant Orders   CBC with Differential/Platelet   Comprehensive metabolic panel   TSH     Respiratory   OSA (obstructive sleep apnea)    Mild sleep apnea on testing, recommend 100% use of CPAP machine which he is adjusting to.        Digestive   GERD (gastroesophageal reflux disease)    Chronic, stable.  Continue Nexium which offers him benefit and obtain Mag level annually.  Consider GI referral if any worsening symptoms.  Recommend complete cessation of alcohol use.      Relevant Medications   esomeprazole (NEXIUM) 40 MG capsule   Other Relevant Orders    Magnesium     Other   Mixed hyperlipidemia    Chronic, ongoing.  Not taking Lopid as recommended.  Recheck cholesterol panel today fasting. Continue to support on alcohol cessation journey.  Recommend continued focus on diet and exercise.  Restart medication if needed.      Relevant Orders   Lipid Panel w/o Chol/HDL Ratio   Anxiety and depression    Chronic, ongoing.  Is minimally using Klonopin and reports no need for refill today.  Continue minimal use of this.  Pt is aware of risks of benzo medication use to include increased sedation, respiratory suppression, falls, dependence and cardiovascular events.  Pt would like to continue treatment as benefit determined to outweigh risk.  UDS and contract next visit. Continue to recommend alternate medications and support on alcohol cessation. Referral to psychiatry placed today, wishes to return and work on alternative treatments.       Relevant Orders   Ambulatory referral to Psychiatry   Gout    Chronic, stable with Allopurinol.  No recent flares.  Check uric acid level today.      Relevant Orders   Uric acid   EtOH dependence ([place]) - Primary    Currently has reduced use, check CMP, Vit D.  Recommend reduction of alcohol use.  Use CPAP 100% of the time, as uses alcohol more for sleep. Could consider addition of Naltrexone in future to assist in reduction.  Referral to psychiatry.      Relevant Orders   CBC with Differential/Platelet   Comprehensive metabolic panel  Insomnia    Referral to psychiatry and recommend he use CPAP 100% of the time.      Obesity    BMI 38.41, has lost and praised for this.  Recommended eating smaller high protein, low fat meals more frequently and exercising 30 mins a day 5 times a week with a goal of 10-15lb weight loss in the next 3 months. Patient voiced their understanding and motivation to adhere to these recommendations.       Relevant Orders   Lipid Panel w/o Chol/HDL Ratio   History of left  inguinal hernia repair    Continue collaboration with general surgery.      Other Visit Diagnoses     IFG (impaired fasting glucose)       Check A1c   Relevant Orders   HgB A1c   Vitamin D deficiency       Check Vitamin D level today and initiate supplement as needed   Relevant Orders   VITAMIN D 25 Hydroxy (Vit-D Deficiency, Fractures)   Encounter for annual physical exam       Annual physical with labs today, health maintenance reviewed and refuses flu vaccine.       Discussed aspirin prophylaxis for myocardial infarction prevention and decision was it was not indicated  LABORATORY TESTING:  Health maintenance labs ordered today as discussed above.   IMMUNIZATIONS:   - Tdap: Tetanus vaccination status reviewed: up to date - Influenza: Refuses - Pneumovax: Not applicable - Prevnar: Not applicable - Zostavax vaccine: Not applicable  SCREENING: - Colonoscopy: Not applicable  Discussed with patient purpose of the colonoscopy is to detect colon cancer at curable precancerous or early stages   - AAA Screening: Not applicable  -Hearing Test: Not applicable  -Spirometry: Not applicable   PATIENT COUNSELING:    Sexuality: Discussed sexually transmitted diseases, partner selection, use of condoms, avoidance of unintended pregnancy  and contraceptive alternatives.   Advised to avoid cigarette smoking.  I discussed with the patient that most people either abstain from alcohol or drink within safe limits (<=14/week and <=4 drinks/occasion for males, <=7/weeks and <= 3 drinks/occasion for females) and that the risk for alcohol disorders and other health effects rises proportionally with the number of drinks per week and how often a drinker exceeds daily limits.  Discussed cessation/primary prevention of drug use and availability of treatment for abuse.   Diet: Encouraged to adjust caloric intake to maintain  or achieve ideal body weight, to reduce intake of dietary saturated fat  and total fat, to limit sodium intake by avoiding high sodium foods and not adding table salt, and to maintain adequate dietary potassium and calcium preferably from fresh fruits, vegetables, and low-fat dairy products.    Stressed the importance of regular exercise  Injury prevention: Discussed safety belts, safety helmets, smoke detector, smoking near bedding or upholstery.   Dental health: Discussed importance of regular tooth brushing, flossing, and dental visits.   Follow up plan: NEXT PREVENTATIVE PHYSICAL DUE IN 1 YEAR. Return in about 6 months (around 06/26/2021) for HLD/HTN, MOOD.

## 2020-12-26 NOTE — Assessment & Plan Note (Signed)
Chronic, ongoing.  Not taking Lopid as recommended.  Recheck cholesterol panel today fasting. Continue to support on alcohol cessation journey.  Recommend continued focus on diet and exercise.  Restart medication if needed.

## 2020-12-26 NOTE — Assessment & Plan Note (Signed)
Chronic, ongoing.  Is minimally using Klonopin and reports no need for refill today.  Continue minimal use of this.  Pt is aware of risks of benzo medication use to include increased sedation, respiratory suppression, falls, dependence and cardiovascular events.  Pt would like to continue treatment as benefit determined to outweigh risk.  UDS and contract next visit. Continue to recommend alternate medications and support on alcohol cessation. Referral to psychiatry placed today, wishes to return and work on alternative treatments.

## 2020-12-26 NOTE — Assessment & Plan Note (Addendum)
Currently has reduced use, check CMP, Vit D.  Recommend reduction of alcohol use.  Use CPAP 100% of the time, as uses alcohol more for sleep. Could consider addition of Naltrexone in future to assist in reduction.  Referral to psychiatry.

## 2020-12-26 NOTE — Assessment & Plan Note (Signed)
Continue collaboration with general surgery.

## 2020-12-26 NOTE — Assessment & Plan Note (Addendum)
Referral to psychiatry and recommend he use CPAP 100% of the time.

## 2020-12-26 NOTE — Assessment & Plan Note (Signed)
Chronic, ongoing with BP at goal on exam today.  Will continue current medication regimen and adjust as needed, refills sent up to date.  Check BP at home at least 3 mornings a week.  Continue cutting back on alcohol use.  Focus on DASH diet recommended.  Return in 6 months for follow-up.  CMP, TSH, and CBC today.

## 2020-12-26 NOTE — Assessment & Plan Note (Signed)
Mild sleep apnea on testing, recommend 100% use of CPAP machine which he is adjusting to.

## 2020-12-26 NOTE — Assessment & Plan Note (Signed)
BMI 38.41, has lost and praised for this.  Recommended eating smaller high protein, low fat meals more frequently and exercising 30 mins a day 5 times a week with a goal of 10-15lb weight loss in the next 3 months. Patient voiced their understanding and motivation to adhere to these recommendations.

## 2020-12-26 NOTE — Assessment & Plan Note (Signed)
Chronic, stable with Allopurinol.  No recent flares.  Check uric acid level today.

## 2020-12-26 NOTE — Assessment & Plan Note (Signed)
Chronic, stable.  Continue Nexium which offers him benefit and obtain Mag level annually.  Consider GI referral if any worsening symptoms.  Recommend complete cessation of alcohol use.

## 2020-12-27 LAB — CBC WITH DIFFERENTIAL/PLATELET
Basophils Absolute: 0 10*3/uL (ref 0.0–0.2)
Basos: 1 %
EOS (ABSOLUTE): 0.1 10*3/uL (ref 0.0–0.4)
Eos: 2 %
Hematocrit: 47.4 % (ref 37.5–51.0)
Hemoglobin: 16.5 g/dL (ref 13.0–17.7)
Immature Grans (Abs): 0 10*3/uL (ref 0.0–0.1)
Immature Granulocytes: 0 %
Lymphocytes Absolute: 2 10*3/uL (ref 0.7–3.1)
Lymphs: 32 %
MCH: 34.1 pg — ABNORMAL HIGH (ref 26.6–33.0)
MCHC: 34.8 g/dL (ref 31.5–35.7)
MCV: 98 fL — ABNORMAL HIGH (ref 79–97)
Monocytes Absolute: 0.7 10*3/uL (ref 0.1–0.9)
Monocytes: 11 %
Neutrophils Absolute: 3.5 10*3/uL (ref 1.4–7.0)
Neutrophils: 54 %
Platelets: 199 10*3/uL (ref 150–450)
RBC: 4.84 x10E6/uL (ref 4.14–5.80)
RDW: 12.4 % (ref 11.6–15.4)
WBC: 6.4 10*3/uL (ref 3.4–10.8)

## 2020-12-27 LAB — URIC ACID: Uric Acid: 4.4 mg/dL (ref 3.8–8.4)

## 2020-12-27 LAB — COMPREHENSIVE METABOLIC PANEL
ALT: 138 IU/L — ABNORMAL HIGH (ref 0–44)
AST: 94 IU/L — ABNORMAL HIGH (ref 0–40)
Albumin/Globulin Ratio: 1.6 (ref 1.2–2.2)
Albumin: 4.4 g/dL (ref 4.0–5.0)
Alkaline Phosphatase: 94 IU/L (ref 44–121)
BUN/Creatinine Ratio: 10 (ref 9–20)
BUN: 9 mg/dL (ref 6–20)
Bilirubin Total: 0.4 mg/dL (ref 0.0–1.2)
CO2: 20 mmol/L (ref 20–29)
Calcium: 9.6 mg/dL (ref 8.7–10.2)
Chloride: 101 mmol/L (ref 96–106)
Creatinine, Ser: 0.9 mg/dL (ref 0.76–1.27)
Globulin, Total: 2.8 g/dL (ref 1.5–4.5)
Glucose: 102 mg/dL — ABNORMAL HIGH (ref 70–99)
Potassium: 4.5 mmol/L (ref 3.5–5.2)
Sodium: 139 mmol/L (ref 134–144)
Total Protein: 7.2 g/dL (ref 6.0–8.5)
eGFR: 114 mL/min/{1.73_m2} (ref >59)

## 2020-12-27 LAB — VITAMIN D 25 HYDROXY (VIT D DEFICIENCY, FRACTURES): Vit D, 25-Hydroxy: 21.1 ng/mL — ABNORMAL LOW (ref 30.0–100.0)

## 2020-12-27 LAB — LIPID PANEL W/O CHOL/HDL RATIO
Cholesterol, Total: 243 mg/dL — ABNORMAL HIGH (ref 100–199)
HDL: 23 mg/dL — ABNORMAL LOW (ref >39)
LDL Chol Calc (NIH): 140 mg/dL — ABNORMAL HIGH (ref 0–99)
Triglycerides: 430 mg/dL — ABNORMAL HIGH (ref 0–149)
VLDL Cholesterol Cal: 80 mg/dL — ABNORMAL HIGH (ref 5–40)

## 2020-12-27 LAB — HEMOGLOBIN A1C
Est. average glucose Bld gHb Est-mCnc: 114 mg/dL
Hgb A1c MFr Bld: 5.6 % (ref 4.8–5.6)

## 2020-12-27 LAB — TSH: TSH: 0.962 u[IU]/mL (ref 0.450–4.500)

## 2020-12-27 LAB — MAGNESIUM: Magnesium: 1.9 mg/dL (ref 1.6–2.3)

## 2020-12-28 MED ORDER — GEMFIBROZIL 600 MG PO TABS: 600.0000 mg | ORAL_TABLET | Freq: Two times a day (BID) | ORAL | 4 refills | Status: DC

## 2020-12-28 NOTE — Progress Notes (Signed)
Contacted via [place] morning [name], your labs have returned: - CBC shows no anemia - Kidney function is normal, creatinine and eGFR.  A1c shows no prediabetes or diabetes. - Liver function remains elevated (AST and ALT), please ensure you continue to cut back on alcohol use.  I would like to obtain imaging of your liver via ultrasound.  I will order this and ask that you schedule when they call, if you do not hear from them over the next week please let me know.  I want to ensure your liver is doing okay. - Thyroid lab is normal, TSH. - Vitamin D is coming up, but remains on low side.  Please ensure to take Vitamin D3 2000 units daily for overall bone and muscle health. - Uric acid and magnesium normal - Cholesterol levels are elevated, I HIGHLY recommend you restart your Lopid to help lower some of this.  Do you have this left?  If no let me know and I will send refills.  Any questions? Keep being amazing!!  Thank you for allowing me to participate in your care.  I appreciate you. Kindest regards, Lilymae Swiech

## 2020-12-28 NOTE — Addendum Note (Signed)
Addended by: Marnee Guarneri T on: 12/28/2020 08:38 AM   Modules accepted: Orders

## 2020-12-28 NOTE — Addendum Note (Signed)
Addended by: Marnee Guarneri T on: 12/28/2020 08:40 AM   Modules accepted: Orders

## 2021-01-01 ENCOUNTER — Encounter: Payer: Self-pay | Admitting: Physician Assistant

## 2021-01-01 ENCOUNTER — Other Ambulatory Visit: Payer: Self-pay

## 2021-01-01 ENCOUNTER — Ambulatory Visit (INDEPENDENT_AMBULATORY_CARE_PROVIDER_SITE_OTHER): Payer: BC Managed Care – PPO | Admitting: Physician Assistant

## 2021-01-01 VITALS — BP 148/94 | HR 106 | Temp 98.8°F | Ht 65.0 in | Wt 230.4 lb

## 2021-01-01 DIAGNOSIS — K4090 Unilateral inguinal hernia, without obstruction or gangrene, not specified as recurrent: Secondary | ICD-10-CM

## 2021-01-01 DIAGNOSIS — Z09 Encounter for follow-up examination after completed treatment for conditions other than malignant neoplasm: Secondary | ICD-10-CM

## 2021-01-01 NOTE — Progress Notes (Signed)
[facility] POST-OP OFFICE VISIT  01/01/2021  HPI: [name]. is a 35 y.o. male 15 days s/p robotic assisted laparoscopic right inguinal hernia repair with Dr Christian Mate  He has done well since surgery.  No complaints of abdominal pain, nausea, emesis, fever, chills, or bowel changes He is tolerating PO and having normal bowel function No issues with his incisions He is following restrictions but anxious to return to work No other complaints  Vital signs: BP (!) 148/94   Pulse (!) 106   Temp 98.8 F (37.1 C) (Oral)   Ht 5\' 5"  (1.651 m)   Wt 230 lb 6.4 oz (104.5 kg)   SpO2 93%   BMI 38.34 kg/m    Physical Exam: Constitutional: Well appearing male, NAD Abdomen: Soft, non-tender, non-distended, no rebound/guarding Skin: Laparoscopic incisions are healing well, no erythema or drainage   Assessment/Plan: This is a 35 y.o. male 15 days s/p robotic assisted laparoscopic right inguinal hernia repair   - Pain control prn  - Reviewed local wound care  - Reviewed lifting restrictions; 6 weeks total; work note given   - He can follow up on as needed basis. He understands to call with questions/concerns   -- Edison Simon, PA-C [facility] 01/01/2021, 9:47 AM [phone] M-F: 7am - 4pm

## 2021-01-01 NOTE — Patient Instructions (Addendum)

## 2021-01-05 ENCOUNTER — Ambulatory Visit: Payer: BC Managed Care – PPO

## 2021-01-07 ENCOUNTER — Encounter: Payer: Self-pay | Admitting: Urology

## 2021-01-13 ENCOUNTER — Ambulatory Visit (INDEPENDENT_AMBULATORY_CARE_PROVIDER_SITE_OTHER): Payer: BC Managed Care – PPO | Admitting: Surgery

## 2021-01-13 ENCOUNTER — Other Ambulatory Visit: Payer: Self-pay

## 2021-01-13 ENCOUNTER — Encounter: Payer: Self-pay | Admitting: Surgery

## 2021-01-13 VITALS — BP 136/89 | HR 80 | Temp 99.0°F | Ht 65.0 in | Wt 240.2 lb

## 2021-01-13 DIAGNOSIS — Z09 Encounter for follow-up examination after completed treatment for conditions other than malignant neoplasm: Secondary | ICD-10-CM

## 2021-01-13 DIAGNOSIS — K4090 Unilateral inguinal hernia, without obstruction or gangrene, not specified as recurrent: Secondary | ICD-10-CM

## 2021-01-13 MED ORDER — GABAPENTIN 300 MG PO CAPS: 300.0000 mg | ORAL_CAPSULE | Freq: Three times a day (TID) | ORAL | 0 refills | Status: DC

## 2021-01-13 NOTE — Patient Instructions (Signed)
Your medication will be sent to the pharmacy. Continue to wear supporting under wear.      GENERAL POST-OPERATIVE PATIENT INSTRUCTIONS   WOUND CARE INSTRUCTIONS:  Keep a dry clean dressing on the wound if there is drainage. The initial bandage may be removed after 24 hours.  Once the wound has quit draining you may leave it open to air.  If clothing rubs against the wound or causes irritation and the wound is not draining you may cover it with a dry dressing during the daytime.  Try to keep the wound dry and avoid ointments on the wound unless directed to do so.  If the wound becomes bright red and painful or starts to drain infected material that is not clear, please contact your physician immediately.  If the wound is mildly pink and has a thick firm ridge underneath it, this is normal, and is referred to as a healing ridge.  This will resolve over the next 4-6 weeks.  BATHING: You may shower if you have been informed of this by your surgeon. However, Please do not submerge in a tub, hot tub, or pool until incisions are completely sealed or have been told by your surgeon that you may do so.  DIET:  You may eat any foods that you can tolerate.  It is a good idea to eat a high fiber diet and take in plenty of fluids to prevent constipation.  If you do become constipated you may want to take a mild laxative or take ducolax tablets on a daily basis until your bowel habits are regular.  Constipation can be very uncomfortable, along with straining, after recent surgery.  ACTIVITY:  You are encouraged to cough and deep breath or use your incentive spirometer if you were given one, every 15-30 minutes when awake.  This will help prevent respiratory complications and low grade fevers post-operatively if you had a general anesthetic.  You may want to hug a pillow when coughing and sneezing to add additional support to the surgical area, if you had abdominal or chest surgery, which will decrease pain during  these times.  You are encouraged to walk and engage in light activity for the next two weeks.  You should not lift more than 20 pounds, until 01/28/2021 as it could put you at increased risk for complications.  Twenty pounds is roughly equivalent to a plastic bag of groceries. At that time- Listen to your body when lifting, if you have pain when lifting, stop and then try again in a few days. Soreness after doing exercises or activities of daily living is normal as you get back in to your normal routine.  MEDICATIONS:  Try to take narcotic medications and anti-inflammatory medications, such as tylenol, ibuprofen, naprosyn, etc., with food.  This will minimize stomach upset from the medication.  Should you develop nausea and vomiting from the pain medication, or develop a rash, please discontinue the medication and contact your physician.  You should not drive, make important decisions, or operate machinery when taking narcotic pain medication.  SUNBLOCK Use sun block to incision area over the next year if this area will be exposed to sun. This helps decrease scarring and will allow you avoid a permanent darkened area over your incision.  QUESTIONS:  Please feel free to call our office if you have any questions, and we will be glad to assist you. [phone]

## 2021-01-13 NOTE — Progress Notes (Signed)
[facility] POST-OP OFFICE VISIT  01/13/2021  HPI: [name]. is a 35 y.o. male nearly 4 weeks s/p robotic assisted left inguinal hernia repair.  We reviewed the operative findings, of less hernia sac and mostly large cord lipoma.  We reviewed the fact that he has some hypersensitivity of the left testicle since surgery.  He is concerned about some swelling as well.  He has been back to work but he has not been doing any heavy lifting, but occasionally comes close to bumping the left scrotum at times working around [place].  Being a bit hypersensitive he is concerned that there may be something else that can be done to help him with pain control.  He wants to stay functional and is willing to try something he is already had the past.  Vital signs: BP 136/89   Pulse 80   Temp 99 F (37.2 C) (Oral)   Ht 5\' 5"  (1.651 m)   Wt 240 lb 3.2 oz (109 kg)   SpO2 94%   BMI 39.97 kg/m    Physical Exam: Constitutional: He appears well. Abdomen: Well rounded, incisions are clean dry and intact. Skin: Incisions well-healed.  On scrotal examination I do not perceive a difference between the testicles in terms of sinus or swelling, there may be a little more prominence of the left adnexa, however there is no remarkable cord tenderness or objective hypersensitivity on exam.  There is definitely no evidence of recurrence of hernia or cord lipoma.  Assessment/Plan: This is a 35 y.o. male 4 weeks s/p robotic LIH  Patient Active Problem List   Diagnosis Date Noted   History of left inguinal hernia repair 10/29/2020   Hydrocele in adult 10/29/2020   Tinea cruris 10/29/2020   OSA (obstructive sleep apnea) 09/14/2020   Obesity 07/11/2019   EtOH dependence ([place]) 03/21/2015   Insomnia 03/21/2015   Hypertension 03/21/2015   Elevated transaminase level 08/19/2014   Mixed hyperlipidemia 08/19/2014   Chronic back pain 08/19/2014   ADHD (attention deficit hyperactivity disorder)  08/19/2014   GERD (gastroesophageal reflux disease) 08/19/2014   Gout 08/19/2014   Laceration 08/15/2013   Anxiety and depression 09/12/2012    -We will continue ibuprofen, will add Neurontin 300 mg/day to assist with pain control, reviewed other options for additional assistance with hypersensitivity and are anticipating things to improve with time.  We will be glad to see him back in 1 month or as needed.   [name] M.D., FACS 01/13/2021, 4:10 PM

## 2021-01-21 ENCOUNTER — Ambulatory Visit: Payer: BC Managed Care – PPO | Admitting: Neurology

## 2021-01-29 ENCOUNTER — Encounter: Payer: BC Managed Care – PPO | Admitting: Surgery

## 2021-03-25 ENCOUNTER — Other Ambulatory Visit: Payer: Self-pay | Admitting: Nurse Practitioner

## 2021-03-25 NOTE — Telephone Encounter (Signed)
Requested medication (s) are on the active medication list: yes  Last refill:  12/28/20  Future visit scheduled: 06/26/20  Notes to clinic:  pharm has request as follows:  Pharmacy comment: Alternative Requested:PT PAYING $20 [place]. PT REQUESTING LOWER-COST ALTERNATIVE: FENOF. $9; FENOF. $9;   Requested Prescriptions  Pending Prescriptions Disp Refills   fenofibrate (TRICOR) 48 MG tablet [Pharmacy Med Name: FENOFIBRATE 48 MG TABLET] 90 tablet 0     Cardiovascular:  Antilipid - Fibric Acid Derivatives Failed - 03/25/2021  1:06 PM      Failed - Total Cholesterol in normal range and within 360 days    Cholesterol, Total  Date Value Ref Range Status  12/26/2020 243 (H) 100 - 199 mg/dL Final          Failed - LDL in normal range and within 360 days    LDL Chol Calc (NIH)  Date Value Ref Range Status  12/26/2020 140 (H) 0 - 99 mg/dL Final          Failed - HDL in normal range and within 360 days    HDL  Date Value Ref Range Status  12/26/2020 23 (L) >39 mg/dL Final          Failed - Triglycerides in normal range and within 360 days    Triglycerides  Date Value Ref Range Status  12/26/2020 430 (H) 0 - 149 mg/dL Final          Failed - ALT in normal range and within 180 days    ALT  Date Value Ref Range Status  12/26/2020 138 (H) 0 - 44 IU/L Final          Failed - AST in normal range and within 180 days    AST  Date Value Ref Range Status  12/26/2020 94 (H) 0 - 40 IU/L Final          Passed - Cr in normal range and within 180 days    Creatinine, Ser  Date Value Ref Range Status  12/26/2020 0.90 0.76 - 1.27 mg/dL Final          Passed - eGFR in normal range and within 180 days    GFR calc Af Amer  Date Value Ref Range Status  08/08/2019 110 >59 mL/min/1.73 Final    Comment:    **[facility] currently reports eGFR in compliance with the current**   recommendations of the [employer]. [facility] will   update reporting as new guidelines are  published from the NKF-ASN   Task force.    GFR, Estimated  Date Value Ref Range Status  12/12/2020 >60 >60 mL/min Final    Comment:    (NOTE) Calculated using the CKD-EPI Creatinine Equation (2021)    eGFR  Date Value Ref Range Status  12/26/2020 114 >59 mL/min/1.73 Final          Passed - Valid encounter within last 12 months    Recent Outpatient Visits           2 months ago Alcohol dependence with alcohol-induced sleep disorder ([place])   [place], Jolene T, NP   4 months ago Left inguinal hernia   [place], Jolene T, NP   9 months ago Alcohol dependence with alcohol-induced sleep disorder ([place])   [place], Barbaraann Faster, NP   1 year ago Encounter for annual physical exam   [place], Henrine Screws T, NP   1 year ago Alcohol dependence  with alcohol-induced sleep disorder [facility])   [place], Barbaraann Faster, NP       Future Appointments             In 3 months [place], Barbaraann Faster, NP [employer]

## 2021-04-23 NOTE — Telephone Encounter (Signed)
Noted, thank you.  For mild sleep apnea, we can consider a referral to dentistry for evaluation for an oral appliance if he would like.  Please send patient a MyChart message, with the recommendation to continue to work on weight loss and if he would like a referral to dentistry, I would be happy to place a referral.

## 2021-04-23 NOTE — Telephone Encounter (Signed)
We received an update from [facility] stating the patient has returned to his ibreeze machine.  Patient's wife states he cannot sleep with his machine and would like to return it.  They also requested for the payments to stop on his account.

## 2021-05-11 NOTE — Telephone Encounter (Signed)
Thanks

## 2021-05-11 NOTE — Telephone Encounter (Signed)
Pt was scheduled with NP tomorrow morning to discuss different options of Sleep Apnea Treatment.  ?

## 2021-05-12 ENCOUNTER — Telehealth (INDEPENDENT_AMBULATORY_CARE_PROVIDER_SITE_OTHER): Payer: BC Managed Care – PPO | Admitting: Adult Health

## 2021-05-12 DIAGNOSIS — G4733 Obstructive sleep apnea (adult) (pediatric): Secondary | ICD-10-CM

## 2021-05-12 NOTE — Progress Notes (Signed)
? ? ? ?PATIENT: [name]. ?DOB: 1986-02-09 ? ?REASON FOR VISIT: follow up ?HISTORY FROM: patient ?PRIMARY NEUROLOGIST:  ? ?Virtual Visit via Video Note ? ?I connected with [name]. on 05/12/21 at  9:00 AM EDT by a video enabled telemedicine application located remotely at [facility] and verified that I am speaking with the correct person using two identifiers who was located at their own home. ?  ?I discussed the limitations of evaluation and management by telemedicine and the availability of in person appointments. The patient expressed understanding and agreed to proceed. ? ? ?PATIENT: [name]. ?DOB: 13-Sep-1985 ? ?REASON FOR VISIT: follow up ?HISTORY FROM: patient ? ?HISTORY OF PRESENT ILLNESS: ?Today 05/12/21: ? ?[name] is a 36 year old male with a history of obstructive sleep apnea on CPAP.  He returns today for virtual visit.  He reports that he was unable to tolerate CPAP.  He had a hard time adjusting to this.  He is interested in trying a dental device. ? ? ? ?REVIEW OF SYSTEMS: Out of a complete 14 system review of symptoms, the patient complains only of the following symptoms, and all other reviewed systems are negative. ? ?ALLERGIES: ?Allergies  ?Allergen Reactions  ? Penicillin G Benzathine   ?  Unknown reaction   ? Amoxicillin Rash  ? ? ?HOME MEDICATIONS: ?Outpatient Medications Prior to Visit  ?Medication Sig Dispense Refill  ? fenofibrate (TRICOR) 48 MG tablet Take 1 tablet (48 mg total) by mouth daily. 90 tablet 4  ? allopurinol (ZYLOPRIM) 300 MG tablet Take 1 tablet (300 mg total) by mouth daily. 90 tablet 4  ? aspirin-acetaminophen-caffeine (EXCEDRIN MIGRAINE) 250-250-65 MG tablet Take 2 tablets by mouth every 6 (six) hours as needed for headache.    ? Carboxymethylcellul-Glycerin (LUBRICATING EYE DROPS OP) Place 1 drop into both eyes 3 (three) times daily as needed (dry eyes).    ? clonazePAM (KLONOPIN) 0.5 MG tablet Take 1 tablet (0.5 mg total) by  mouth 2 (two) times daily as needed for anxiety. 30 tablet 2  ? colchicine 0.6 MG tablet Take 0.6 mg by mouth daily as needed (gout flare).    ? esomeprazole (NEXIUM) 40 MG capsule Take 1 capsule (40 mg total) by mouth daily. 90 capsule 4  ? gabapentin (NEURONTIN) 300 MG capsule Take 1 capsule (300 mg total) by mouth 3 (three) times daily. 90 capsule 0  ? ibuprofen (ADVIL) 800 MG tablet Take 1 tablet (800 mg total) by mouth every 8 (eight) hours as needed. 30 tablet 0  ? lisinopril (ZESTRIL) 40 MG tablet Take 1 tablet (40 mg total) by mouth daily. 90 tablet 4  ? metoprolol succinate (TOPROL-XL) 25 MG 24 hr tablet Take 1 tablet (25 mg total) by mouth daily. 90 tablet 4  ? ?No facility-administered medications prior to visit.  ? ? ?PAST MEDICAL HISTORY: ?Past Medical History:  ?Diagnosis Date  ? ADHD (attention deficit hyperactivity disorder)   ? Anxiety   ? Bipolar 1 disorder ([place])   ? Bipolar 1 disorder ([place]) 08/19/2014  ? Chronic back pain   ? Depression   ? GERD (gastroesophageal reflux disease)   ? Gout   ? Hyperlipidemia   ? Hypertension   ? Sleep apnea   ? ? ?PAST SURGICAL HISTORY: ?Past Surgical History:  ?Procedure Laterality Date  ? APPENDECTOMY    ? INSERTION OF MESH Left 12/17/2020  ? Procedure: INSERTION OF MESH;  Surgeon: Ronny Bacon, MD;  Location: [facility];  Service: General;  Laterality: Left;  ? KIDNEY STONE SURGERY    ? ? ?FAMILY HISTORY: ?Family History  ?Problem Relation Age of Onset  ? Hyperlipidemia Father   ? Breast cancer Maternal Grandmother   ? Cancer Paternal Grandmother   ?     pancreatic  ? ? ?SOCIAL HISTORY: ?Social History  ? ?Socioeconomic History  ? Marital status: Married  ?  Spouse name: Not on file  ? Number of children: Not on file  ? Years of education: Not on file  ? Highest education level: Not on file  ?Occupational History  ? Not on file  ?Tobacco Use  ? Smoking status: Never  ? Smokeless tobacco: Current  ?  Types: Chew  ?Vaping Use  ? Vaping Use: Never used  ?Substance  and Sexual Activity  ? Alcohol use: Yes  ?  Alcohol/week: 1.0 - 2.0 standard drink  ?  Types: 1 - 2 Glasses of wine per week  ? Drug use: No  ? Sexual activity: Yes  ?Other Topics Concern  ? Not on file  ?Social History Narrative  ? Not on file  ? ?Social Determinants of Health  ? ?Financial Resource Strain: Low Risk   ? Difficulty of Paying Living Expenses: Not hard at all  ?Food Insecurity: No Food Insecurity  ? Worried About Charity fundraiser in the Last Year: Never true  ? Ran Out of Food in the Last Year: Never true  ?Transportation Needs: No Transportation Needs  ? Lack of Transportation (Medical): No  ? Lack of Transportation (Non-Medical): No  ?Physical Activity: Insufficiently Active  ? Days of Exercise per Week: 2 days  ? Minutes of Exercise per Session: 20 min  ?Stress: No Stress Concern Present  ? Feeling of Stress : Only a little  ?Social Connections: Moderately Isolated  ? Frequency of Communication with Friends and Family: Three times a week  ? Frequency of Social Gatherings with Friends and Family: Three times a week  ? Attends Religious Services: Never  ? Active Member of Clubs or Organizations: No  ? Attends Archivist Meetings: Never  ? Marital Status: Married  ?Intimate Partner Violence: Not At Risk  ? Fear of Current or Ex-Partner: No  ? Emotionally Abused: No  ? Physically Abused: No  ? Sexually Abused: No  ? ? ? ? ?PHYSICAL EXAM ?Generalized: Well developed, in no acute distress  ? ?Neurological examination  ?Mentation: Alert oriented to time, place, history taking. Follows all commands speech and language fluent ?Cranial nerve II-XII:Extraocular movements were full. Facial symmetry noted. Head turning and shoulder shrug  were normal and symmetric. ?Reflexes: UTA ? ?DIAGNOSTIC DATA (LABS, IMAGING, TESTING) ?- I reviewed patient records, labs, notes, testing and imaging myself where available. ? ?Lab Results  ?Component Value Date  ? WBC 6.4 12/26/2020  ? HGB 16.5 12/26/2020  ? HCT  47.4 12/26/2020  ? MCV 98 (H) 12/26/2020  ? PLT 199 12/26/2020  ? ?   ?Component Value Date/Time  ? NA 139 12/26/2020 1409  ? K 4.5 12/26/2020 1409  ? CL 101 12/26/2020 1409  ? CO2 20 12/26/2020 1409  ? GLUCOSE 102 (H) 12/26/2020 1409  ? GLUCOSE 115 (H) 12/12/2020 1310  ? BUN 9 12/26/2020 1409  ? CREATININE 0.90 12/26/2020 1409  ? CALCIUM 9.6 12/26/2020 1409  ? PROT 7.2 12/26/2020 1409  ? ALBUMIN 4.4 12/26/2020 1409  ? AST 94 (H) 12/26/2020 1409  ? ALT 138 (H) 12/26/2020 1409  ? ALKPHOS 94  12/26/2020 1409  ? BILITOT 0.4 12/26/2020 1409  ? GFRNONAA >60 12/12/2020 1310  ? GFRAA 110 08/08/2019 0940  ? ?Lab Results  ?Component Value Date  ? CHOL 243 (H) 12/26/2020  ? HDL 23 (L) 12/26/2020  ? [place] 140 (H) 12/26/2020  ? TRIG 430 (H) 12/26/2020  ? ?Lab Results  ?Component Value Date  ? HGBA1C 5.6 12/26/2020  ? ?No results found for: VITAMINB12 ?Lab Results  ?Component Value Date  ? TSH 0.962 12/26/2020  ? ? ? ? ?ASSESSMENT AND PLAN ?36 y.o. year old male  has a past medical history of ADHD (attention deficit hyperactivity disorder), Anxiety, Bipolar 1 disorder ([place]), Bipolar 1 disorder ([place]) (08/19/2014), Chronic back pain, Depression, GERD (gastroesophageal reflux disease), Gout, Hyperlipidemia, Hypertension, and Sleep apnea. here with: ? ?OSA  ? ?Referral will be sent for dental device he was unable to tolerate CPAP. ?He can follow-up with our office on an as-needed basis ? ?I spent 20 minutes of face-to-face and non-face-to-face time with patient.  This included previsit chart review, lab review, study review, order entry, electronic health record documentation, patient education. ? ?Ward Givens, MSN, NP-C 05/12/2021, 2:34 PM ?[facility] ?[address], [address] ?[place], [place] [address] ?[phone] ? ?

## 2021-05-13 ENCOUNTER — Telehealth: Payer: Self-pay | Admitting: Adult Health

## 2021-05-13 NOTE — Telephone Encounter (Signed)
Sent to Dr. Ron Parker ph # [phone]. ?

## 2021-06-21 DIAGNOSIS — E559 Vitamin D deficiency, unspecified: Secondary | ICD-10-CM | POA: Insufficient documentation

## 2021-06-21 NOTE — Patient Instructions (Incomplete)

## 2021-06-23 ENCOUNTER — Telehealth: Payer: Self-pay | Admitting: Nurse Practitioner

## 2021-06-23 NOTE — Telephone Encounter (Signed)
Spoke with [name] patient's wife, and she says patient wanted to let Jolene know that he left on 06/16/21. Patient has left to go to detox and wanted his provider to be aware. Patient wife states patient will not be able to make his appointment Friday. [name] states that patient is currently in [place] and they have him on Ativan and different prescriptions and she is wondering if Jolene would be able to prescription take over management once the patient comes back home. Patient wife states they will not send patient home with medications and they will call to get patient rescheduled for an appointment when they are for sure when patient will arrive back in [place].  ?

## 2021-06-23 NOTE — Telephone Encounter (Signed)
Copied from [place] [id number]. Topic: General - Other ?>> Jun 23, 2021  8:15 AM Alanda Slim E wrote: ?Reason for CRM: Wife called to give [name] an update on the pts treatment and that he went / please advise and call spouse back ?

## 2021-06-24 NOTE — Telephone Encounter (Signed)
Spoke with patient wife and informed her of Jolene's recommendations. Patient wife was given provider's fax number. She says she would reach out to case manager and ask them to send over the notes for provider to review in regards to his medications. Patient wife verbalized understanding and has no further questions at this time. ?

## 2021-06-24 NOTE — Telephone Encounter (Signed)
Noted  

## 2021-06-26 ENCOUNTER — Ambulatory Visit: Payer: Self-pay | Admitting: Nurse Practitioner

## 2021-06-26 DIAGNOSIS — F10282 Alcohol dependence with alcohol-induced sleep disorder: Secondary | ICD-10-CM

## 2021-06-26 DIAGNOSIS — E559 Vitamin D deficiency, unspecified: Secondary | ICD-10-CM

## 2021-06-26 DIAGNOSIS — E782 Mixed hyperlipidemia: Secondary | ICD-10-CM

## 2021-06-26 DIAGNOSIS — I10 Essential (primary) hypertension: Secondary | ICD-10-CM

## 2021-06-26 DIAGNOSIS — F32A Depression, unspecified: Secondary | ICD-10-CM

## 2021-06-26 DIAGNOSIS — G4733 Obstructive sleep apnea (adult) (pediatric): Secondary | ICD-10-CM

## 2021-06-29 IMAGING — CR DG CHEST 2V
1 series · 2 of 2 positions shown · non-contrast
Comparison: None.

CLINICAL DATA: Chest pain and shortness of breath

EXAM:
CHEST - 2 VIEW

[Series 1: dg chest 2 view · 0.14mm/px · 2 of 2 slices shown]
[im 1/2]
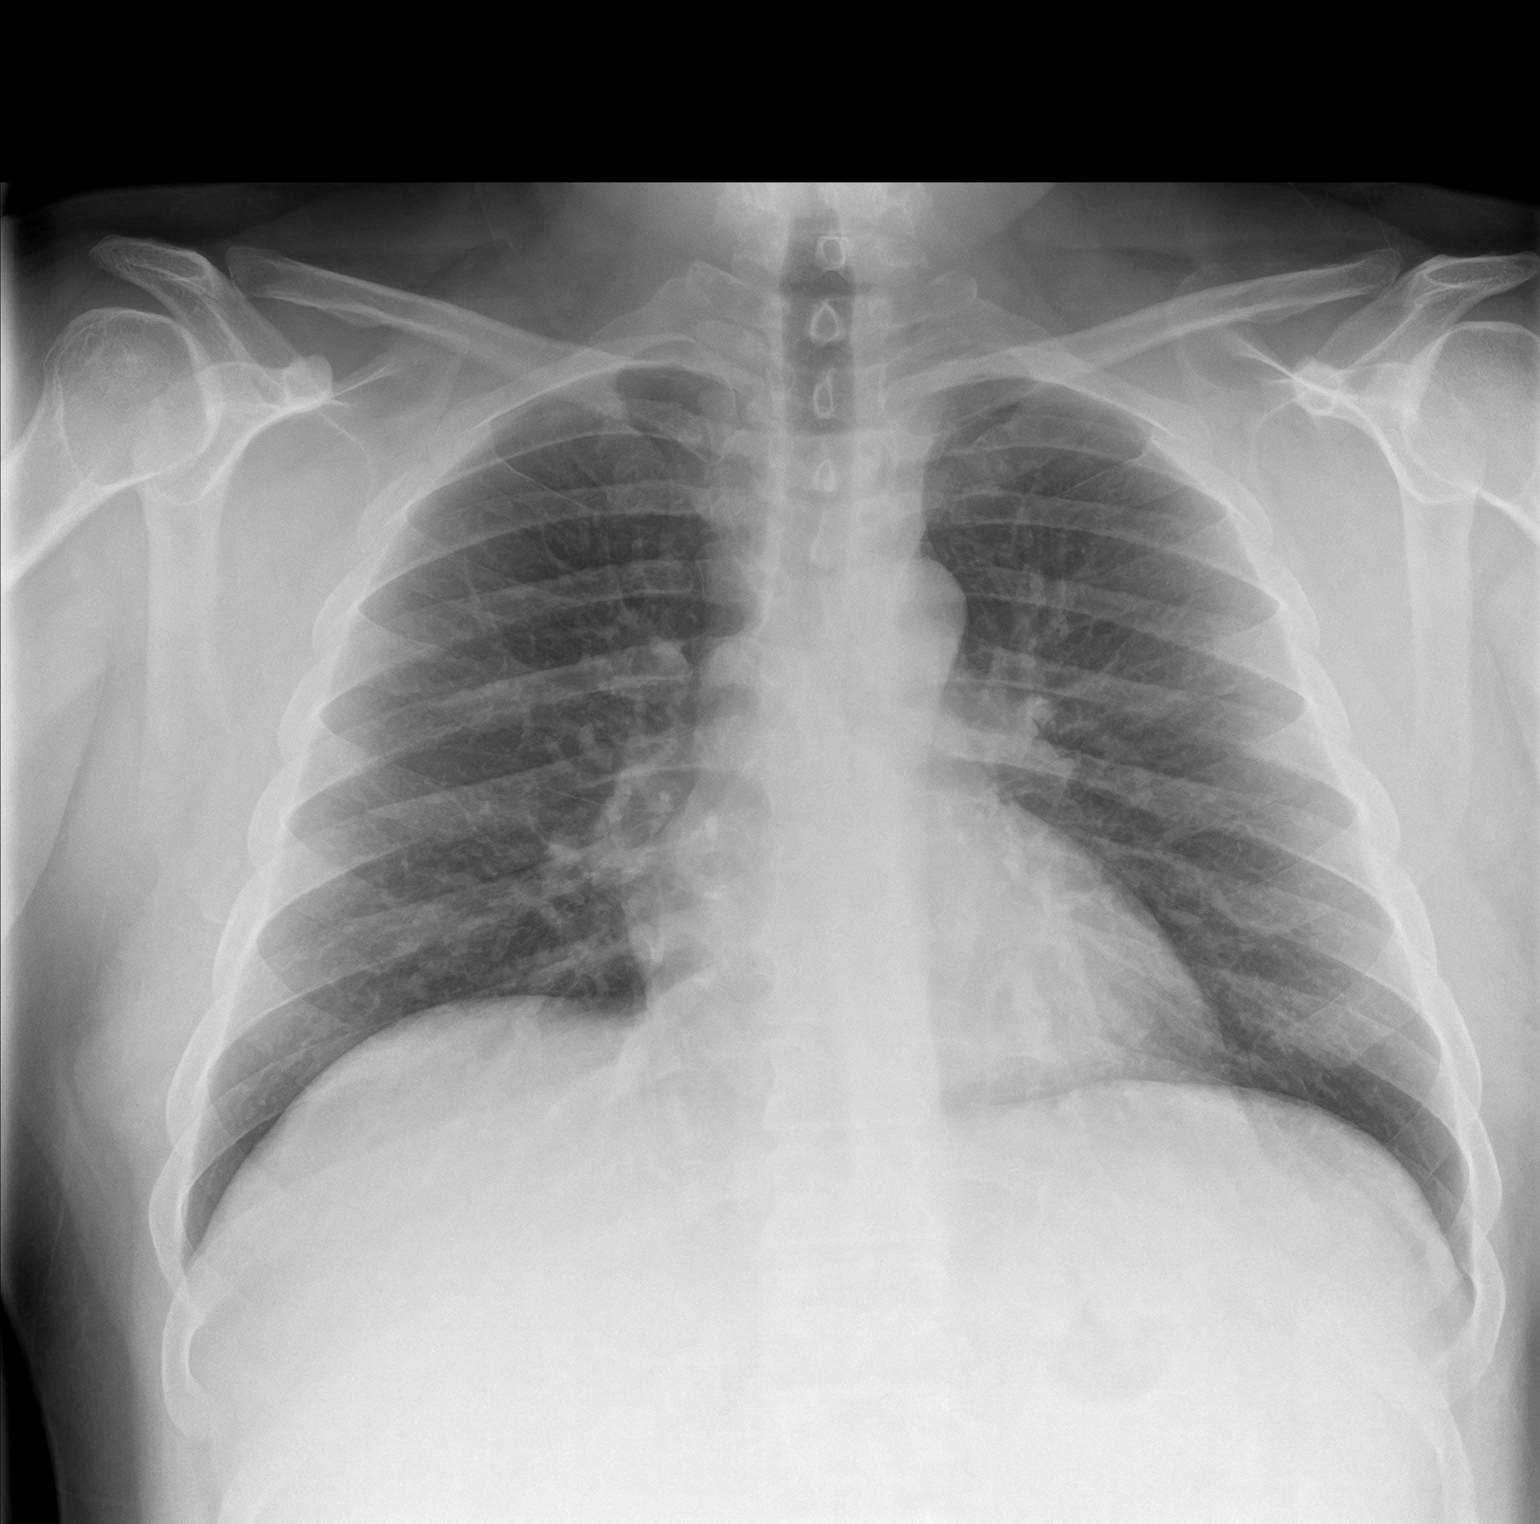
[im 2/2]
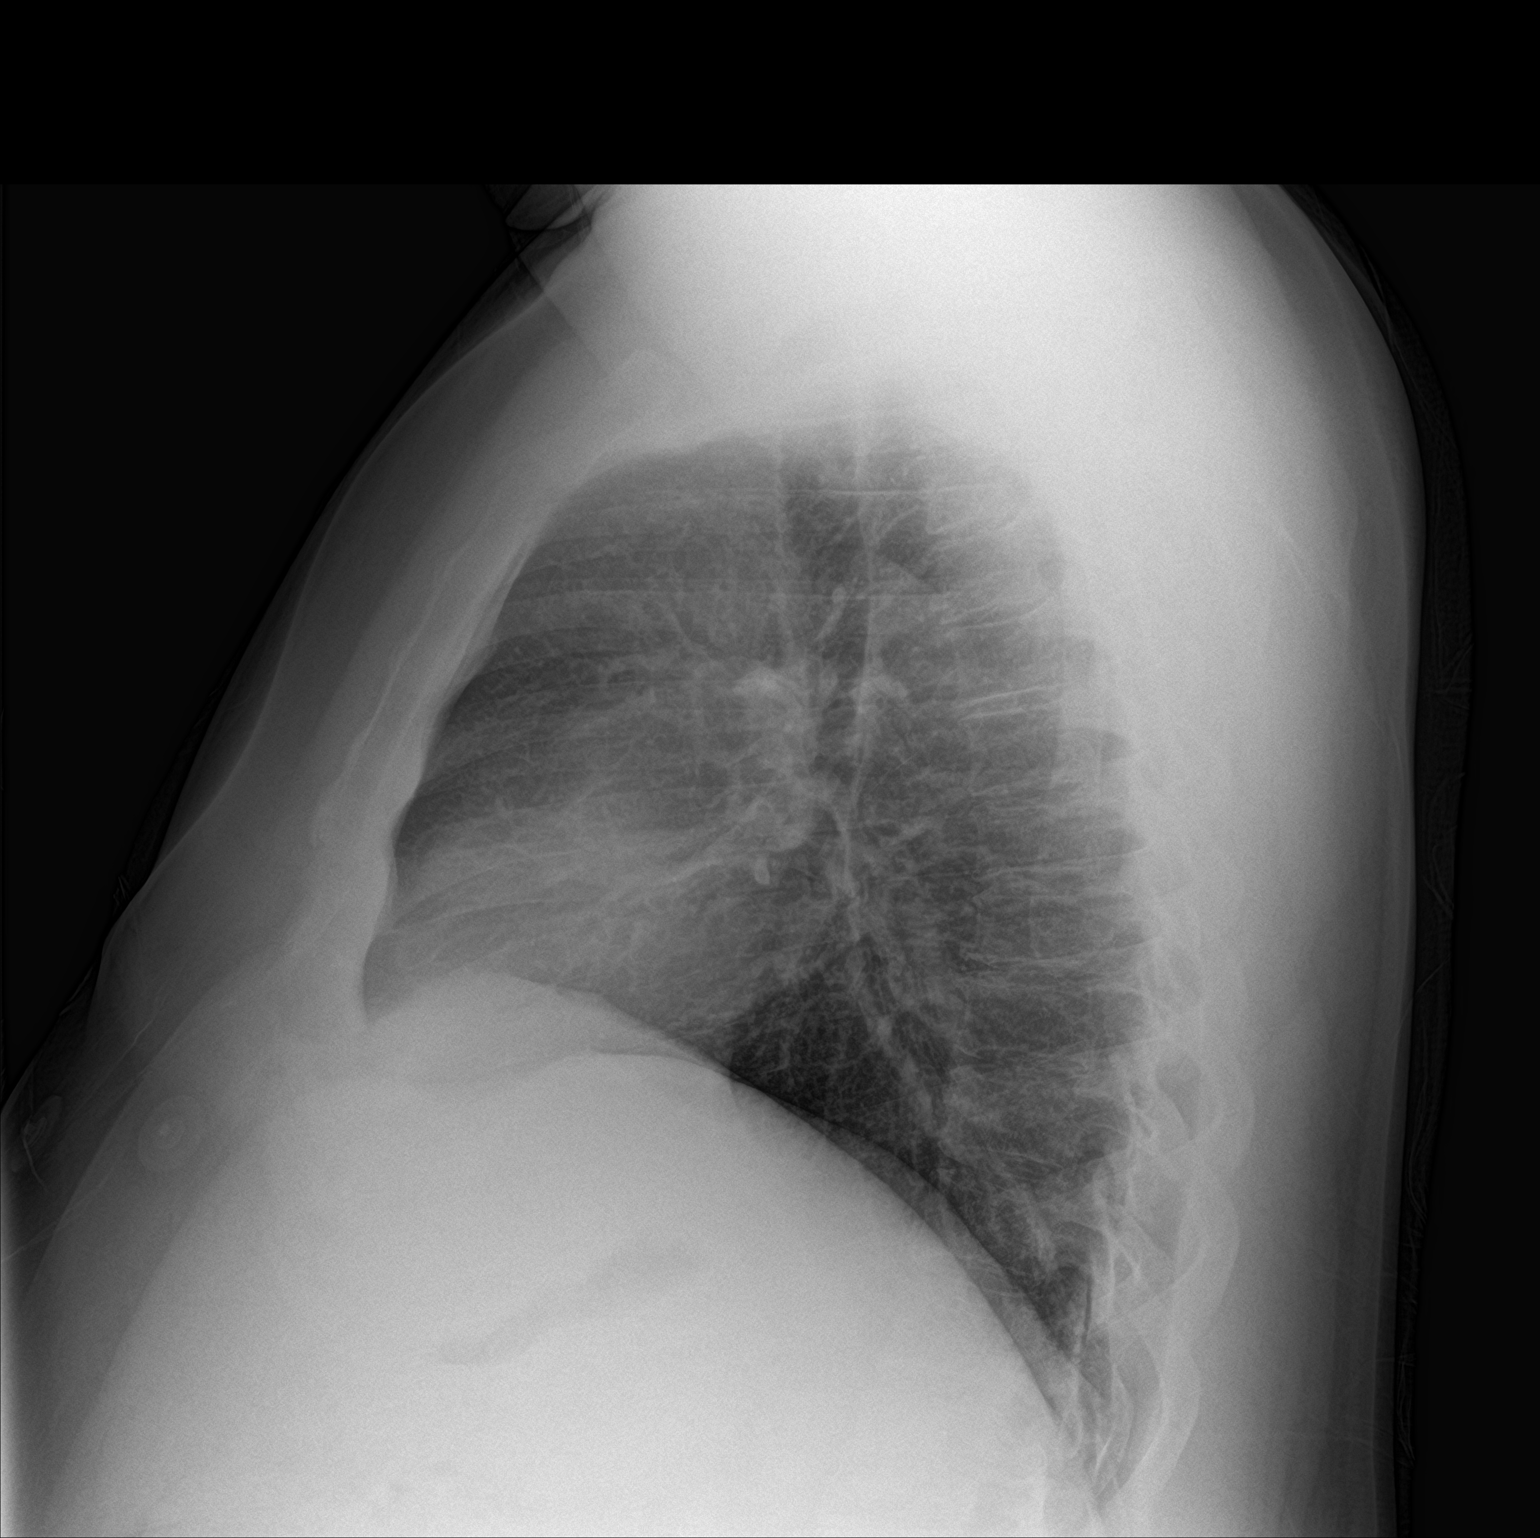

[2 of 2 positions shown; findings below may reference images not displayed]

FINDINGS: The lungs are clear. The heart size and pulmonary vascularity are
normal. No adenopathy. No pneumothorax. No bone lesions.
IMPRESSION: Lungs clear.  Cardiac silhouette normal.

## 2021-07-01 ENCOUNTER — Other Ambulatory Visit: Payer: Self-pay | Admitting: Nurse Practitioner

## 2021-07-01 NOTE — Telephone Encounter (Signed)
Requested medication (s) are due for refill today -expired Rx ? ?Requested medication (s) are on the active medication list -yes ? ?Future visit scheduled -no ? ?Last refill: 06/24/20 #90 4RF ? ?Notes to clinic: Request RF: expired Rx ? ?Requested Prescriptions  ?Pending Prescriptions Disp Refills  ? allopurinol (ZYLOPRIM) 300 MG tablet [Pharmacy Med Name: ALLOPURINOL 300 MG TABLET] 90 tablet 4  ?  Sig: TAKE 1 TABLET BY MOUTH EVERY DAY  ?  ? Endocrinology:  Gout Agents - allopurinol Passed - 07/01/2021  2:24 AM  ?  ?  Passed - Uric Acid in normal range and within 360 days  ?  Uric Acid  ?Date Value Ref Range Status  ?12/26/2020 4.4 3.8 - 8.4 mg/dL Final  ?  Comment:  ?             Therapeutic target for gout patients: <6.0  ?  ?  ?  ?  Passed - Cr in normal range and within 360 days  ?  Creatinine, Ser  ?Date Value Ref Range Status  ?12/26/2020 0.90 0.76 - 1.27 mg/dL Final  ?  ?  ?  ?  Passed - Valid encounter within last 12 months  ?  Recent Outpatient Visits   ? ?      ? 6 months ago Alcohol dependence with alcohol-induced sleep disorder ([place])  ? [facility] [place], Yoe T, NP  ? 8 months ago Left inguinal hernia  ? [place], [place] T, NP  ? 1 year ago Alcohol dependence with alcohol-induced sleep disorder ([place])  ? [place], Henrine Screws T, NP  ? 1 year ago Encounter for annual physical exam  ? [place], [place] T, NP  ? 1 year ago Alcohol dependence with alcohol-induced sleep disorder ([place])  ? [facility] [place], [place] T, NP  ? ?  ?  ? ? ?  ?  ?  Passed - CBC within normal limits and completed in the last 12 months  ?  WBC  ?Date Value Ref Range Status  ?12/26/2020 6.4 3.4 - 10.8 x10E3/uL Final  ?12/12/2020 7.8 4.0 - 10.5 K/uL Final  ? ?RBC  ?Date Value Ref Range Status  ?12/26/2020 4.84 4.14 - 5.80 x10E6/uL Final  ?12/12/2020 4.64 4.22 - 5.81 MIL/uL Final  ? ?Hemoglobin  ?Date Value Ref Range Status  ?12/26/2020 16.5  13.0 - 17.7 g/dL Final  ? ?Hematocrit  ?Date Value Ref Range Status  ?12/26/2020 47.4 37.5 - 51.0 % Final  ? ?MCHC  ?Date Value Ref Range Status  ?12/26/2020 34.8 31.5 - 35.7 g/dL Final  ?12/12/2020 36.7 (H) 30.0 - 36.0 g/dL Final  ? ?MCH  ?Date Value Ref Range Status  ?12/26/2020 34.1 (H) 26.6 - 33.0 pg Final  ?12/12/2020 36.0 (H) 26.0 - 34.0 pg Final  ? ?MCV  ?Date Value Ref Range Status  ?12/26/2020 98 (H) 79 - 97 fL Final  ? ?No results found for: PLTCOUNTKUC, LABPLAT, [place] ?RDW  ?Date Value Ref Range Status  ?12/26/2020 12.4 11.6 - 15.4 % Final  ? ?  ?  ?  ? ? ? ?Requested Prescriptions  ?Pending Prescriptions Disp Refills  ? allopurinol (ZYLOPRIM) 300 MG tablet [Pharmacy Med Name: ALLOPURINOL 300 MG TABLET] 90 tablet 4  ?  Sig: TAKE 1 TABLET BY MOUTH EVERY DAY  ?  ? Endocrinology:  Gout Agents - allopurinol Passed - 07/01/2021  2:24 AM  ?  ?  Passed - Uric Acid in normal  range and within 360 days  ?  Uric Acid  ?Date Value Ref Range Status  ?12/26/2020 4.4 3.8 - 8.4 mg/dL Final  ?  Comment:  ?             Therapeutic target for gout patients: <6.0  ?  ?  ?  ?  Passed - Cr in normal range and within 360 days  ?  Creatinine, Ser  ?Date Value Ref Range Status  ?12/26/2020 0.90 0.76 - 1.27 mg/dL Final  ?  ?  ?  ?  Passed - Valid encounter within last 12 months  ?  Recent Outpatient Visits   ? ?      ? 6 months ago Alcohol dependence with alcohol-induced sleep disorder ([place])  ? [facility] [place], [place] T, NP  ? 8 months ago Left inguinal hernia  ? [place], Gilliam T, NP  ? 1 year ago Alcohol dependence with alcohol-induced sleep disorder ([place])  ? [place], Henrine Screws T, NP  ? 1 year ago Encounter for annual physical exam  ? [place], [place] T, NP  ? 1 year ago Alcohol dependence with alcohol-induced sleep disorder ([place])  ? [facility] [place], [place] T, NP  ? ?  ?  ? ? ?  ?  ?  Passed - CBC within normal limits and  completed in the last 12 months  ?  WBC  ?Date Value Ref Range Status  ?12/26/2020 6.4 3.4 - 10.8 x10E3/uL Final  ?12/12/2020 7.8 4.0 - 10.5 K/uL Final  ? ?RBC  ?Date Value Ref Range Status  ?12/26/2020 4.84 4.14 - 5.80 x10E6/uL Final  ?12/12/2020 4.64 4.22 - 5.81 MIL/uL Final  ? ?Hemoglobin  ?Date Value Ref Range Status  ?12/26/2020 16.5 13.0 - 17.7 g/dL Final  ? ?Hematocrit  ?Date Value Ref Range Status  ?12/26/2020 47.4 37.5 - 51.0 % Final  ? ?MCHC  ?Date Value Ref Range Status  ?12/26/2020 34.8 31.5 - 35.7 g/dL Final  ?12/12/2020 36.7 (H) 30.0 - 36.0 g/dL Final  ? ?MCH  ?Date Value Ref Range Status  ?12/26/2020 34.1 (H) 26.6 - 33.0 pg Final  ?12/12/2020 36.0 (H) 26.0 - 34.0 pg Final  ? ?MCV  ?Date Value Ref Range Status  ?12/26/2020 98 (H) 79 - 97 fL Final  ? ?No results found for: PLTCOUNTKUC, LABPLAT, [place] ?RDW  ?Date Value Ref Range Status  ?12/26/2020 12.4 11.6 - 15.4 % Final  ? ?  ?  ?  ? ? ? ?

## 2021-07-02 ENCOUNTER — Encounter: Payer: Self-pay | Admitting: Nurse Practitioner

## 2021-07-02 NOTE — Telephone Encounter (Signed)
Patient scheduled.

## 2021-07-03 ENCOUNTER — Ambulatory Visit (INDEPENDENT_AMBULATORY_CARE_PROVIDER_SITE_OTHER): Payer: Managed Care, Other (non HMO) | Admitting: Nurse Practitioner

## 2021-07-03 ENCOUNTER — Encounter: Payer: Self-pay | Admitting: Nurse Practitioner

## 2021-07-03 VITALS — BP 108/69 | HR 60 | Temp 98.3°F | Wt 232.4 lb

## 2021-07-03 DIAGNOSIS — Z6838 Body mass index (BMI) 38.0-38.9, adult: Secondary | ICD-10-CM

## 2021-07-03 DIAGNOSIS — F10282 Alcohol dependence with alcohol-induced sleep disorder: Secondary | ICD-10-CM | POA: Diagnosis not present

## 2021-07-03 DIAGNOSIS — I10 Essential (primary) hypertension: Secondary | ICD-10-CM | POA: Diagnosis not present

## 2021-07-03 DIAGNOSIS — G4733 Obstructive sleep apnea (adult) (pediatric): Secondary | ICD-10-CM

## 2021-07-03 DIAGNOSIS — E782 Mixed hyperlipidemia: Secondary | ICD-10-CM | POA: Diagnosis not present

## 2021-07-03 DIAGNOSIS — F5104 Psychophysiologic insomnia: Secondary | ICD-10-CM

## 2021-07-03 DIAGNOSIS — F32A Depression, unspecified: Secondary | ICD-10-CM

## 2021-07-03 DIAGNOSIS — E6609 Other obesity due to excess calories: Secondary | ICD-10-CM

## 2021-07-03 DIAGNOSIS — K219 Gastro-esophageal reflux disease without esophagitis: Secondary | ICD-10-CM

## 2021-07-03 DIAGNOSIS — F419 Anxiety disorder, unspecified: Secondary | ICD-10-CM

## 2021-07-03 MED ORDER — LEVETIRACETAM 500 MG PO TABS: 500.0000 mg | ORAL_TABLET | Freq: Two times a day (BID) | ORAL | 4 refills | Status: DC

## 2021-07-03 MED ORDER — LISINOPRIL 40 MG PO TABS: 40.0000 mg | ORAL_TABLET | Freq: Every day | ORAL | 4 refills | Status: DC

## 2021-07-03 MED ORDER — METOPROLOL SUCCINATE ER 25 MG PO TB24: 25.0000 mg | ORAL_TABLET | Freq: Every day | ORAL | 4 refills | Status: DC

## 2021-07-03 MED ORDER — CLONAZEPAM 0.5 MG PO TABS: 0.5000 mg | ORAL_TABLET | Freq: Two times a day (BID) | ORAL | 2 refills | Status: DC | PRN

## 2021-07-03 MED ORDER — ESOMEPRAZOLE MAGNESIUM 40 MG PO CPDR: 40.0000 mg | DELAYED_RELEASE_CAPSULE | Freq: Every day | ORAL | 4 refills | Status: DC

## 2021-07-03 MED ORDER — HYDROXYZINE PAMOATE 50 MG PO CAPS: 50.0000 mg | ORAL_CAPSULE | Freq: Three times a day (TID) | ORAL | 4 refills | Status: DC | PRN

## 2021-07-03 NOTE — Assessment & Plan Note (Signed)
Chronic, ongoing.  Is minimally using Klonopin and reports no need for refill today.  Continue minimal use of this.  Pt is aware of risks of benzo medication use to include increased sedation, respiratory suppression, falls, dependence and cardiovascular events.  Pt would like to continue treatment as benefit determined to outweigh risk.  UDS and contract next visit. Continue to recommend alternate medications and support on alcohol cessation. Recommend [facility] for therapy online. ? ?

## 2021-07-03 NOTE — Assessment & Plan Note (Signed)
Not using CPAP, did not tolerate.  ?Inspire for patient in future. ?

## 2021-07-03 NOTE — Progress Notes (Signed)
? ?BP 108/69   Pulse 60   Temp 98.3 ?F (36.8 ?C) (Oral)   Wt 232 lb 6.4 oz (105.4 kg)   SpO2 98%   BMI 38.67 kg/m?   ? ?Subjective:  ? ? Patient ID: [name]., male    DOB: [birth year], 36 y.o.   MRN: [id number] ? ?HPI: ?[name]. is a 36 y.o. male ? ?Chief Complaint  ?Patient presents with  ? Detox Follow Up  ?  Patient is here to follow up on his detox program and discuss next steps with provider at today's visit. Patient denies having any concerns at today's visit.   ? ?ALCOHOL ABUSE: ?Recently was in Detox program in [place] for 16 days.  He went there was he had always drew and line, got there Tuesday of that week to go to work -- somehow he ended up with bottle in hand and never went to work, ending up becoming "trashed".  The first 5 days he felt they provided him what he needed, then remainder of days went by.  He has been placed on Keppra 500 MG BID and Vistaril 50 MG TID PRN  His reason for drinking has always been because he can not sleep, the Vistaril has been helping him sleep.  He sat beside bar on way home and did not crave a drink. ? ?He continues to go to meetings since returning home -- [employer].  Is also doing [employer]. ? ?HYPERTENSION / HYPERLIPIDEMIA ?Continues on Metoprolol XL 25 MG and Lisinopril 40 MG daily + Fenofibrate. ?  ?For GERD takes Nexium which he reports offers a lot of benefit. ?Satisfied with current treatment? yes ?Duration of hypertension: chronic ?BP monitoring frequency: not checking ?BP range:  ?BP medication side effects: no ?Duration of hyperlipidemia: chronic ?Aspirin: no ?Recent stressors: no ?Recurrent headaches: no ?Visual changes: no ?Palpitations: no ?Dyspnea: no ?Chest pain: no ?Lower extremity edema: no ?Dizzy/lightheaded: no ?  ?DEPRESSION ?Takes Klonopin minimally, has not used in quite awhile.  ?Mood status: stable ?Satisfied with current treatment?: yes ?Symptom severity: mild  ?Duration of current treatment : chronic ?Side effects:  no ?Medication compliance: good compliance ?Psychotherapy/counseling: none ?Previous psychiatric medications: Klonopin ?Depressed mood: no ?Anxious mood: yes ?Anhedonia: no ?Significant weight loss or gain: no ?Insomnia: yes hard to fall asleep ?Fatigue: no ?Feelings of worthlessness or guilt: no ?Impaired concentration/indecisiveness: no ?Suicidal ideations: no ?Hopelessness: no ?Crying spells: no ? ?  07/03/2021  ? 10:44 AM 12/26/2020  ?  2:04 PM 06/24/2020  ?  2:53 PM 08/08/2019  ?  8:58 AM 07/11/2019  ?  8:48 AM  ?Depression screen PHQ 2/9  ?Decreased Interest 0 0 1 0 0  ?Down, Depressed, Hopeless 0 0 1 0 0  ?PHQ - 2 Score 0 0 2 0 0  ?Altered sleeping 0 0 3 0 0  ?Tired, decreased energy '1 1 3 ' 0 1  ?Change in appetite 0 0 3 0 0  ?Feeling bad or failure about yourself  0 0 2 0 0  ?Trouble concentrating 0 0 0 0 0  ?Moving slowly or fidgety/restless 0 0 3 0 0  ?Suicidal thoughts 0 0 0 0 0  ?PHQ-9 Score '1 1 16 ' 0 1  ?Difficult doing work/chores Not difficult at all   Not difficult at all Not difficult at all  ?  ?Relevant past medical, surgical, family and social history reviewed and updated as indicated. Interim medical history since our last visit reviewed. ?Allergies and medications  reviewed and updated. ? ?Review of Systems  ?Constitutional:  Negative for activity change, diaphoresis, fatigue and fever.  ?Respiratory:  Negative for cough, chest tightness, shortness of breath and wheezing.   ?Cardiovascular:  Negative for chest pain, palpitations and leg swelling.  ?Gastrointestinal: Negative.   ?Neurological: Negative.   ?Psychiatric/Behavioral:  Negative for decreased concentration, self-injury, sleep disturbance and suicidal ideas.   ? ?Per HPI unless specifically indicated above ? ?   ?Objective:  ?  ?BP 108/69   Pulse 60   Temp 98.3 ?F (36.8 ?C) (Oral)   Wt 232 lb 6.4 oz (105.4 kg)   SpO2 98%   BMI 38.67 kg/m?   ?Wt Readings from Last 3 Encounters:  ?07/03/21 232 lb 6.4 oz (105.4 kg)  ?01/13/21 240 lb 3.2 oz  (109 kg)  ?01/01/21 230 lb 6.4 oz (104.5 kg)  ?  ?Physical Exam ?Vitals and nursing note reviewed.  ?Constitutional:   ?   General: He is not in acute distress. ?   Appearance: He is well-developed and well-groomed. He is obese. He is not ill-appearing.  ?HENT:  ?   Head: Normocephalic and atraumatic.  ?   Right Ear: Hearing normal. No drainage.  ?   Left Ear: Hearing normal. No drainage.  ?Eyes:  ?   General: Lids are normal. No scleral icterus.    ?   Right eye: No discharge.     ?   Left eye: No discharge.  ?   Conjunctiva/sclera: Conjunctivae normal.  ?   Pupils: Pupils are equal, round, and reactive to light.  ?Neck:  ?   Thyroid: No thyromegaly.  ?   Vascular: No carotid bruit.  ?Cardiovascular:  ?   Rate and Rhythm: Normal rate and regular rhythm.  ?   Heart sounds: Normal heart sounds, S1 normal and S2 normal. No murmur heard. ?  No gallop.  ?Pulmonary:  ?   Effort: Pulmonary effort is normal. No accessory muscle usage or respiratory distress.  ?   Breath sounds: Normal breath sounds.  ?Abdominal:  ?   General: Bowel sounds are normal. There is no distension.  ?   Palpations: Abdomen is soft. There is no hepatomegaly.  ?   Tenderness: There is no abdominal tenderness.  ?Musculoskeletal:     ?   General: Normal range of motion.  ?   Cervical back: Normal range of motion and neck supple.  ?   Right lower leg: No edema.  ?   Left lower leg: No edema.  ?Skin: ?   General: Skin is warm and dry.  ?Neurological:  ?   Mental Status: He is alert and oriented to person, place, and time.  ?Psychiatric:     ?   Mood and Affect: Mood normal.     ?   Speech: Speech normal.     ?   Behavior: Behavior normal.     ?   Thought Content: Thought content normal.  ? ? ?Results for orders placed or performed in visit on 12/26/20  ?CBC with Differential/Platelet  ?Result Value Ref Range  ? WBC 6.4 3.4 - 10.8 x10E3/uL  ? RBC 4.84 4.14 - 5.80 x10E6/uL  ? Hemoglobin 16.5 13.0 - 17.7 g/dL  ? Hematocrit 47.4 37.5 - 51.0 %  ? MCV 98 (H)  79 - 97 fL  ? MCH 34.1 (H) 26.6 - 33.0 pg  ? MCHC 34.8 31.5 - 35.7 g/dL  ? RDW 12.4 11.6 - 15.4 %  ? Platelets 199 150 -  450 x10E3/uL  ? Neutrophils 54 Not Estab. %  ? Lymphs 32 Not Estab. %  ? Monocytes 11 Not Estab. %  ? Eos 2 Not Estab. %  ? Basos 1 Not Estab. %  ? Neutrophils Absolute 3.5 1.4 - 7.0 x10E3/uL  ? Lymphocytes Absolute 2.0 0.7 - 3.1 x10E3/uL  ? Monocytes Absolute 0.7 0.1 - 0.9 x10E3/uL  ? EOS (ABSOLUTE) 0.1 0.0 - 0.4 x10E3/uL  ? Basophils Absolute 0.0 0.0 - 0.2 x10E3/uL  ? Immature Granulocytes 0 Not Estab. %  ? Immature Grans (Abs) 0.0 0.0 - 0.1 x10E3/uL  ?Comprehensive metabolic panel  ?Result Value Ref Range  ? Glucose 102 (H) 70 - 99 mg/dL  ? BUN 9 6 - 20 mg/dL  ? Creatinine, Ser 0.90 0.76 - 1.27 mg/dL  ? eGFR 114 >59 mL/min/1.73  ? BUN/Creatinine Ratio 10 9 - 20  ? Sodium 139 134 - 144 mmol/L  ? Potassium 4.5 3.5 - 5.2 mmol/L  ? Chloride 101 96 - 106 mmol/L  ? CO2 20 20 - 29 mmol/L  ? Calcium 9.6 8.7 - 10.2 mg/dL  ? Total Protein 7.2 6.0 - 8.5 g/dL  ? Albumin 4.4 4.0 - 5.0 g/dL  ? Globulin, Total 2.8 1.5 - 4.5 g/dL  ? Albumin/Globulin Ratio 1.6 1.2 - 2.2  ? Bilirubin Total 0.4 0.0 - 1.2 mg/dL  ? Alkaline Phosphatase 94 44 - 121 IU/L  ? AST 94 (H) 0 - 40 IU/L  ? ALT 138 (H) 0 - 44 IU/L  ?TSH  ?Result Value Ref Range  ? TSH 0.962 0.450 - 4.500 uIU/mL  ?Lipid Panel w/o Chol/HDL Ratio  ?Result Value Ref Range  ? Cholesterol, Total 243 (H) 100 - 199 mg/dL  ? Triglycerides 430 (H) 0 - 149 mg/dL  ? HDL 23 (L) >39 mg/dL  ? VLDL Cholesterol Cal 80 (H) 5 - 40 mg/dL  ? LDL Chol Calc (NIH) 140 (H) 0 - 99 mg/dL  ?VITAMIN D 25 Hydroxy (Vit-D Deficiency, Fractures)  ?Result Value Ref Range  ? Vit D, 25-Hydroxy 21.1 (L) 30.0 - 100.0 ng/mL  ?Uric acid  ?Result Value Ref Range  ? Uric Acid 4.4 3.8 - 8.4 mg/dL  ?Magnesium  ?Result Value Ref Range  ? Magnesium 1.9 1.6 - 2.3 mg/dL  ?HgB A1c  ?Result Value Ref Range  ? Hgb A1c MFr Bld 5.6 4.8 - 5.6 %  ? Est. average glucose Bld gHb Est-mCnc 114 mg/dL  ? ?    ?Assessment & Plan:  ? ?Problem List Items Addressed This Visit   ? ?  ? Cardiovascular and Mediastinum  ? Hypertension  ?  Chronic, ongoing with BP at goal on exam today.  Will continue current medication regimen and adj

## 2021-07-03 NOTE — Assessment & Plan Note (Addendum)
Chronic, ongoing with BP at goal on exam today.  Will continue current medication regimen and adjust as needed, refills up to date.  Check BP at home at least 3 mornings a week.  Continue cutting back on alcohol use.  Focus on DASH diet recommended. CMP, TSH, and CBC today. ?

## 2021-07-03 NOTE — Assessment & Plan Note (Signed)
Chronic, stable.  Continue Nexium which offers him benefit and obtain Mag level annually.  Consider GI referral if any worsening symptoms.  Recommend continued cessation of alcohol use. ?

## 2021-07-03 NOTE — Assessment & Plan Note (Addendum)
Recently went to Detox in [place] and is overall doing well at this time, >16 days sober.  Will continue Keppra and Vistaril at this time as are offering him benefit.  Refills sent.  Recommend therapy via [employer] and that he continue [employer] meetings and obtain sponsor. Check B12 level. ?

## 2021-07-03 NOTE — Assessment & Plan Note (Signed)
BMI 38.67.  Recommended eating smaller high protein, low fat meals more frequently and exercising 30 mins a day 5 times a week with a goal of 10-15lb weight loss in the next 3 months. Patient voiced their understanding and motivation to adhere to these recommendations. ? ?

## 2021-07-03 NOTE — Patient Instructions (Signed)
Alcohol Abuse and Nutrition ?Alcohol abuse is any pattern of alcohol consumption that harms your health, relationships, or work. Alcohol abuse can cause poor nutrition (malnutrition or malnourishment) and a lack of nutrients (nutrient deficiencies), which can lead to more health problems. Alcohol abuse brings malnutrition and nutrient deficiencies in two ways: ?It causes your liver to work abnormally. This affects how your body divides (breaks down) and absorbs nutrients from food. ?It causes you to eat poorly. Many people who abuse alcohol do not eat enough carbohydrates, protein, fat, vitamins, and minerals. ?Nutrients that are commonly lacking (deficient) in people who abuse alcohol include: ?Vitamins. ?Vitamin A. This is needed for your vision, metabolism, and ability to fight off infections (immunity). ?B vitamins. These include folate, thiamine, and niacin. These are needed for new cell growth. ?Vitamin C. This plays an important role in wound healing, immunity, and helping your body to absorb iron. ?Vitamin D. This is necessary for your body to absorb and use calcium. It is produced by your liver, but you can also get it from food and from sun exposure. ?Minerals. ?Calcium. This is needed for healthy bones as well as heart and blood vessel (cardiovascular) function. ?Iron. This is important for blood, muscle, and nervous system functioning. ?Magnesium. This plays an important role in muscle and nerve function, and it helps to control blood sugar and blood pressure. ?Zinc. This is important for the normal functioning of your nervous system and digestive system (gastrointestinal tract). ?If you think that you have an alcohol dependency problem, or if it is hard to stop drinking because you feel sick or different when you do not use alcohol, talk with your health care provider or another health professional about where to get help. ?Nutrition is an essential factor in the therapy for alcohol abuse. Your health  care provider or diet and nutrition specialist (dietitian) will work with you to design a plan that can help to restore nutrients to your body and prevent the risk of complications. ?What is my plan? ?Your dietitian may develop a specific eating plan that is based on your condition and any other problems that you have. An eating plan will commonly include: ?A balanced diet. ?Grains: 6-8 oz (170-227 g) a day. Examples of 1 oz of whole grains include 1 cup of whole-wheat cereal, ? cup of brown rice, or 1 slice of whole-wheat bread. ?Vegetables: 2-3 cups a day. Examples of 1 cup of vegetables include 2 medium carrots, 1 large tomato, or 2 stalks of celery. ?Fruits: 1-2 cups a day. Examples of 1 cup of fruit include 1 large banana, 1 small apple, 8 large strawberries, or 1 large orange. ?Meat and other protein: 5-6 oz (142-170 g) a day. ?A cut of meat or fish that is the size of a deck of cards is about 3-4 oz. ?Foods that provide 1 oz of protein include 1 egg, ? cup of nuts or seeds, or 1 tablespoon (16 g) of peanut butter. ?Dairy: 2-3 cups a day. Examples of 1 cup of dairy include 8 oz (230 mL) of milk, 8 oz (230 g) of yogurt, or 1? oz (44 g) of natural cheese. ?Vitamin and mineral supplements. ?What are tips for following this plan? ?Eat frequent meals and snacks. Try to eat 5-6 small meals each day. ?Take vitamin or mineral supplements as recommended by your dietitian. ?If you are malnourished or if your dietitian recommends it: ?You may follow a high-protein, high-calorie diet. This may include: ?2,000-3,000 calories (kilocalories) a day. ?70-100  g (grams) of protein a day. ?You may be directed to follow a diet that includes a complete nutritional supplement beverage. This can help to restore calories, protein, and vitamins to your body. Depending on your condition, you may be advised to consume this beverage instead of your meals or in addition to them. ?Certain medicines may cause changes in your appetite, taste,  and weight. Work with your health care provider and dietitian to make any changes to your medicines and eating plan. ?If you are unable to take in enough food and calories by mouth, your health care provider may recommend a feeding tube. This tube delivers nutritional supplements directly to your stomach. ?Recommended foods ?Eat foods that are high in molecules that prevent oxygen from reacting with your food (antioxidants). These foods include grapes, berries, nuts, green tea, and dark green or orange vegetables. Eating these can help to prevent some of the stress that is placed on your liver by consuming alcohol. ?Eat a variety of fresh fruits and vegetables each day. This will help you to get fiber and vitamins in your diet. ?Drink plenty of water and other clear fluids, such as apple juice and broth. Try to drink at least 48-64 oz (1.5-2 L) of water a day. ?Include foods fortified with vitamins and minerals in your diet. Commonly fortified foods include milk, orange juice, cereal, and bread. ?Eat a variety of foods that are high in omega-3 and omega-6 fatty acids. These include fish, nuts and seeds, and soybeans. These foods may help your liver to recover and may also stabilize your mood. ?If you are a vegetarian: ?Eat a variety of protein-rich foods. ?Pair whole grains with plant-based proteins at meals and snack time. For example, eat rice with beans, put peanut butter on whole-grain toast, or eat oatmeal with sunflower seeds. ?The items listed above may not be a complete list of foods and beverages you can eat. Contact a dietitian for more information. ?Foods to avoid ?Avoid foods and drinks that are high in fat and sugar. Sugary drinks, salty snacks, and candy contain empty calories. This means that they lack important nutrients such as protein, fiber, and vitamins. ?Avoid alcohol. This is the best way to avoid malnutrition due to alcohol abuse. If you must drink, drink measured amounts. Measured drinking  means limiting your intake to no more than 1 drink a day for nonpregnant women and 2 drinks a day for men. One drink equals 12 oz (355 mL) of beer, 5 oz (148 mL) of wine, or 1? oz (44 mL) of hard liquor. ?Limit your intake of caffeine. Replace drinks like coffee and black tea with decaffeinated coffee and decaffeinated herbal tea. ?The items listed above may not be a complete list of foods and beverages you should avoid. Contact a dietitian for more information. ?Summary ?Alcohol abuse can cause poor nutrition (malnutrition or malnourishment) and a lack of nutrients (nutrient deficiencies), which can lead to more health problems. ?Common nutrient deficiencies include vitamin deficiencies (A, B, C, and D) and mineral deficiencies (calcium, iron, magnesium, and zinc). ?Nutrition is an essential factor in the therapy for alcohol abuse. ?Your health care provider and dietitian can help you to develop a specific eating plan that includes a balanced diet plus vitamin and mineral supplements. ?This information is not intended to replace advice given to you by your health care provider. Make sure you discuss any questions you have with your health care provider. ?Document Revised: 01/09/2021 Document Reviewed: 01/07/2020 ?Elsevier Patient Education ? 2023  [employer] ? ?

## 2021-07-03 NOTE — Assessment & Plan Note (Signed)
Chronic, ongoing.  Continue Vistaril as is offering him benefit to alcohol cessation and rest. ?

## 2021-07-03 NOTE — Assessment & Plan Note (Addendum)
Chronic, ongoing.  Continue Fenofibrate.  Recheck cholesterol panel today fasting. Continue to support on alcohol cessation journey.  Recommend continued focus on diet and exercise.   ?

## 2021-07-04 LAB — COMPREHENSIVE METABOLIC PANEL
ALT: 47 IU/L — ABNORMAL HIGH (ref 0–44)
AST: 38 IU/L (ref 0–40)
Albumin/Globulin Ratio: 1.6 (ref 1.2–2.2)
Albumin: 4.6 g/dL (ref 4.0–5.0)
Alkaline Phosphatase: 82 IU/L (ref 44–121)
BUN/Creatinine Ratio: 13 (ref 9–20)
BUN: 13 mg/dL (ref 6–20)
Bilirubin Total: 0.6 mg/dL (ref 0.0–1.2)
CO2: 23 mmol/L (ref 20–29)
Calcium: 10 mg/dL (ref 8.7–10.2)
Chloride: 100 mmol/L (ref 96–106)
Creatinine, Ser: 0.99 mg/dL (ref 0.76–1.27)
Globulin, Total: 2.9 g/dL (ref 1.5–4.5)
Glucose: 84 mg/dL (ref 70–99)
Potassium: 4.5 mmol/L (ref 3.5–5.2)
Sodium: 138 mmol/L (ref 134–144)
Total Protein: 7.5 g/dL (ref 6.0–8.5)
eGFR: 101 mL/min/{1.73_m2} (ref >59)

## 2021-07-04 LAB — CBC WITH DIFFERENTIAL/PLATELET
Basophils Absolute: 0.1 10*3/uL (ref 0.0–0.2)
Basos: 1 %
EOS (ABSOLUTE): 0.3 10*3/uL (ref 0.0–0.4)
Eos: 3 %
Hematocrit: 48.5 % (ref 37.5–51.0)
Hemoglobin: 16.9 g/dL (ref 13.0–17.7)
Immature Grans (Abs): 0 10*3/uL (ref 0.0–0.1)
Immature Granulocytes: 0 %
Lymphocytes Absolute: 2.6 10*3/uL (ref 0.7–3.1)
Lymphs: 27 %
MCH: 34.4 pg — ABNORMAL HIGH (ref 26.6–33.0)
MCHC: 34.8 g/dL (ref 31.5–35.7)
MCV: 99 fL — ABNORMAL HIGH (ref 79–97)
Monocytes Absolute: 0.6 10*3/uL (ref 0.1–0.9)
Monocytes: 6 %
Neutrophils Absolute: 6.3 10*3/uL (ref 1.4–7.0)
Neutrophils: 63 %
Platelets: 218 10*3/uL (ref 150–450)
RBC: 4.91 x10E6/uL (ref 4.14–5.80)
RDW: 11.9 % (ref 11.6–15.4)
WBC: 9.9 10*3/uL (ref 3.4–10.8)

## 2021-07-04 LAB — LIPID PANEL W/O CHOL/HDL RATIO
Cholesterol, Total: 213 mg/dL — ABNORMAL HIGH (ref 100–199)
HDL: 27 mg/dL — ABNORMAL LOW (ref >39)
LDL Chol Calc (NIH): 159 mg/dL — ABNORMAL HIGH (ref 0–99)
Triglycerides: 145 mg/dL (ref 0–149)
VLDL Cholesterol Cal: 27 mg/dL (ref 5–40)

## 2021-07-04 LAB — VITAMIN B12: Vitamin B-12: 744 pg/mL (ref 232–1245)

## 2021-07-04 LAB — TSH: TSH: 1.34 u[IU]/mL (ref 0.450–4.500)

## 2021-07-05 NOTE — Progress Notes (Signed)
Contacted via [facility] ? ? ?Good afternoon Danon, your labs have returned: ?- CBC shows no anemia or infection. ?- Kidney function, creatinine and eGFR, remains normal. Liver function MUCH improved from previous checks with only very mild elevation in ALT, but normal AST -- GREAT JOB!! ?- Cholesterol labs are elevated, please take your Fenofibrate daily to assist.  Triglyceride levels are actually much improved from past checks!!! ?- Thyroid and B12 levels are normal.  Any questions? ?Keep being inspiring!!  Thank you for allowing me to participate in your care.  I appreciate you. ?Kindest regards, ?Hania Cerone ?

## 2021-08-28 ENCOUNTER — Encounter: Payer: Managed Care, Other (non HMO) | Admitting: Nurse Practitioner

## 2021-08-28 ENCOUNTER — Encounter: Payer: Self-pay | Admitting: Nurse Practitioner

## 2021-08-28 VITALS — BP 99/63 | HR 73 | Temp 98.2°F | Ht 65.0 in | Wt 236.6 lb

## 2021-08-28 NOTE — Progress Notes (Signed)
Left without being seen.

## 2021-08-28 NOTE — Patient Instructions (Signed)
Alcohol Abuse and Dependence Information, Adult Alcohol is a widely available drug. People drink alcohol in different amounts. People who drink alcohol very often and in large amounts often have problems during and after drinking. They may develop what is called an alcohol use disorder. There are two main types of alcohol use disorders: Alcohol abuse. This is when you use alcohol too much or too often. You may use alcohol to make yourself feel happy or to reduce stress. You may have a hard time setting a limit on the amount you drink. Alcohol dependence. This is when you use alcohol consistently for a period of time, and your body changes as a result. This can make it hard to stop drinking because you may start to feel sick or feel different when you do not use alcohol. These symptoms are known as withdrawal. How can alcohol abuse and dependence affect me? Alcohol abuse and dependence can have a negative effect on your life. Drinking too much can lead to addiction. You may feel like you need alcohol to function normally. You may drink alcohol before work in the morning, during the day, or as soon as you get home from work in the evening. These actions can result in: Poor work performance. Job loss. Financial problems. Car crashes or criminal charges from driving after drinking alcohol. Problems in your relationships with friends and family. Losing the trust and respect of coworkers, friends, and family. Drinking heavily over a long period of time can permanently damage your body and brain, and can cause lifelong health issues, such as: Damage to your liver or pancreas. Heart problems, high blood pressure, or stroke. Certain cancers. Decreased ability to fight infections. Brain or nerve damage. Depression. Early (premature) death. If you are careless or you crave alcohol, it is easy to drink more than your body can handle (overdose). Alcohol overdose is a serious situation that requires  hospitalization. It may lead to permanent injuries or death. What can increase my risk? Having a family history of alcohol abuse. Having depression or other mental health conditions. Beginning to drink at an early age. Binge drinking often. Experiencing trauma, stress, and an unstable home life during childhood. Spending time with people who drink often. What actions can I take to prevent or manage alcohol abuse and dependence? Do not drink alcohol if: Your health care provider tells you not to drink. You are pregnant, may be pregnant, or are planning to become pregnant. If you drink alcohol: Limit how much you use to: 0-1 drink a day for women. 0-2 drinks a day for men. Be aware of how much alcohol is in your drink. In the [place] one drink equals one 12 oz bottle of beer (355 mL), one 5 oz glass of wine (148 mL), or one 1 oz glass of hard liquor (44 mL). Stop drinking if you have been drinking too much. This can be very hard to do if you are used to abusing alcohol. If you begin to have withdrawal symptoms, talk with your health care provider or a person that you trust. These symptoms may include anxiety, shaky hands, headache, nausea, sweating, or not being able to sleep. Choose to drink nonalcoholic beverages in social gatherings and places where there may be alcohol. Activity Spend more time on activities that you enjoy that do not involve alcohol, like hobbies or exercise. Find healthy ways to cope with stress, such as exercise, meditation, or spending time with people you care about. General information Talk to your family,   coworkers, and friends about supporting you in your efforts to stop drinking. If they drink, ask them not to drink around you. Spend more time with people who do not drink alcohol. If you think that you have an alcohol dependency problem: Tell friends or family about your concerns. Talk with your health care provider or another health professional about where to  get help. Work with a Transport planner and a Regulatory affairs officer. Consider joining a support group for people who struggle with alcohol abuse and dependence. Where to find support  Your health care provider. SMART Recovery: www.smartrecovery.org Therapy and support groups Local treatment centers or chemical dependency counselors. Local AA groups in your community: NicTax.com.pt Where to find more information Centers for Disease Control and Prevention: http://www.wolf.info/ National Institute on Alcohol Abuse and Alcoholism: http://www.bradshaw.com/ Alcoholics Anonymous (AA): NicTax.com.pt Contact a health care provider if: You drank more or for longer than you intended on more than one occasion. You tried to stop drinking or to cut back on how much you drink, but you were not able to. You often drink to the point of vomiting or passing out. You want to drink so badly that you cannot think about anything else. You have problems in your life due to drinking, but you continue to drink. You keep drinking even though you feel anxious, depressed, or have experienced memory loss. You have stopped doing the things you used to enjoy in order to drink. You have to drink more than you used to in order to get the effect you want. You experience anxiety, sweating, nausea, shakiness, and trouble sleeping when you try to stop drinking. Get help right away if: You have thoughts about hurting yourself or others. You have serious withdrawal symptoms, including: Confusion. Racing heart. High blood pressure. Fever. If you ever feel like you may hurt yourself or others, or have thoughts about taking your own life, get help right away. You can go to your nearest emergency department or call: Your local emergency services (911 in the [place].S[place] A suicide crisis helpline, such as the [place] at 3402460424 or 988 in the [place]. This is open 24 hours a day. Summary Alcohol abuse and dependence can  have a negative effect on your life. Drinking too much or too often can lead to addiction. If you drink alcohol, limit how much you use. If you are having trouble keeping your drinking under control, find ways to change your behavior. Hobbies, calming activities, exercise, or support groups can help. If you feel you need help with changing your drinking habits, talk with your health care provider, a good friend, or a therapist, or go to an [place] group. This information is not intended to replace advice given to you by your health care provider. Make sure you discuss any questions you have with your health care provider. Document Revised: 01/09/2021 Document Reviewed: 04/25/2018 Elsevier Patient Education  [place].

## 2021-08-30 NOTE — Patient Instructions (Signed)
Alcohol Use Disorder Alcohol use disorder is a condition in which drinking disrupts daily life. People with this condition drink too much alcohol and cannot control their drinking. Alcohol use disorder can cause serious problems with physical health. It can affect the brain, heart, and other internal organs. This disorder can raise the risk for certain cancers and cause problems with mental health, such as depression or anxiety. What are the causes? This condition is caused by drinking too much alcohol over time. Some people with this condition drink to cope with or escape from negative life events. Others drink to relieve pain or symptoms of mental illness. What increases the risk? You are more likely to develop this condition if: You have a family history of alcohol use disorder. Your culture encourages drinking to the point of becoming drunk (intoxication). You had a mood or conduct disorder in childhood. You have been abused. You are an adolescent and you: Have poor performance in school. Have poor supervision or guidance. Act on impulse and like taking risks. What are the signs or symptoms? Symptoms of this condition include: Drinking more than you want to. Trying several times without success to drink less. Spending a lot of time thinking about alcohol, getting alcohol, drinking, or recovering from drinking. Continuing to drink even when it is causing serious problems in your daily life. Drinking when it is dangerous to drink, such as before driving a car. Needing more and more alcohol to get the same effect you want (building up tolerance). Having symptoms of withdrawal when you stop drinking. Withdrawal symptoms may include: Trouble sleeping, leading to tiredness (fatigue). Mood swings of depression and anxiety. Physical symptoms, such as a fast heart rate, rapid breathing, high blood pressure (hypertension), fever, cold sweats, or nausea. Seizures. Severe confusion. Feeling or  seeing things that are not there (hallucinations). Shaking movements that you cannot control (tremors). How is this diagnosed? This condition is diagnosed with an assessment. Your health care provider may start by asking three or four questions about your drinking, or he or she may give you a simple test to take. This helps to get clear information from you. You may also have a physical exam or lab tests. You may be referred to a substance abuse counselor. How is this treated? With education, some people with alcohol use disorder are able to reduce their drinking. Many with this disorder cannot change their drinking behavior on their own and need help from substance use specialists. These specialists are counselors who can help diagnose how severe your disorder is and what type of treatment you need. Treatments may include: Detoxification. Detoxification involves quitting drinking with supervision and direction of health care providers. Your health care provider may prescribe prescription medicines within the first week to help lessen withdrawal symptoms. Alcohol withdrawal can be dangerous and life-threatening. Detoxification may be provided in a home, community, or primary care setting, or in a hospital or substance use treatment facility. Counseling. This may involve motivational interviewing (MI), family therapy, or cognitive behavioral therapy (CBT). It is provided by substance use treatment counselors or professional therapists. A counselor can address the things you can do to change your drinking behavior and how to maintain the changes. Talk therapy aims to: Identify your positive motivations to change. Identify and avoid the things that trigger your drinking. Help you learn how to plan your behavior change. Develop support systems that can help you sustain the change. Medicines. Medicines can help treat this disorder by: Decreasing cravings. Decreasing the  positive feeling you have when you  drink. Causing an uncomfortable physical reaction when you drink (aversion therapy). Mutual help groups such as Alcoholics Anonymous (AA). These groups are led by people who have quit drinking. The groups provide emotional support, advice, and guidance. Some people with this condition benefit from a combination of treatments provided by specialized substance use treatment centers. Follow these instructions at home:  Medicines Take over-the-counter and prescription medicines only as told by your health care provider. Ask before starting any new medicines, herbs, or supplements. General instructions Ask friends and family members to support your choice to stay sober. Avoid situations where alcohol is served. Create a plan to deal with tempting situations. Attend support groups regularly. Practice hobbies or activities you enjoy. Do not drink and drive. Keep all follow-up visits as told by your health care provider. This is important. How is this prevented? If you drink alcohol: Limit how much you use to: 0-1 drink a day for nonpregnant women. 0-2 drinks a day for men. Be aware of how much alcohol is in your drink. In the [place].S., one drink equals one 12 oz bottle of beer (355 mL), one 5 oz glass of wine (148 mL), or one 1 oz glass of hard liquor (44 mL). If you have a mental health condition, seek treatment. Develop a healthy lifestyle through: Meditation or deep breathing. Exercise. Spending time in nature. Listening to music. Talking with a trusted friend or family member. If you are an adolescent: Do not drink alcohol. Avoid gatherings where you might be tempted. Do not be afraid to say no if someone offers you alcohol. Speak up about why you do not want to drink. Set a positive example for others around you by not drinking. Build relationships with friends who do not drink. Where to find more information Substance Abuse and Mental Health Services Administration:  SamedayNews.com.cy Alcoholics Anonymous: ShedSizes.ch Contact a health care provider if: You cannot take your medicines as told. Your symptoms get worse or you experience symptoms of withdrawal when you stop drinking. You start drinking again (relapse) and your symptoms get worse. Get help right away if: You have thoughts about hurting yourself or others. If you ever feel like you may hurt yourself or others, or have thoughts about taking your own life, get help right away. Go to your nearest emergency department or: Call your local emergency services (911 in the [place] Call a suicide crisis helpline, such as the [facility] at (380)164-0481 or 988 in the [place]. This is open 24 hours a day in the U.S. Text the Crisis Text Line at (727)348-8690 (in the [place].). Summary Alcohol use disorder is a condition in which drinking disrupts daily life. People with this condition drink too much alcohol and cannot control their drinking. Treatment may include detoxification, counseling, medicines, and support groups. Ask friends and family members to support you. Avoid situations where alcohol is served. Get help right away if you have thoughts about hurting yourself or others. This information is not intended to replace advice given to you by your health care provider. Make sure you discuss any questions you have with your health care provider. Document Revised: 01/09/2021 Document Reviewed: 01/04/2019 Elsevier Patient Education  [place].

## 2021-08-31 ENCOUNTER — Telehealth (INDEPENDENT_AMBULATORY_CARE_PROVIDER_SITE_OTHER): Payer: Managed Care, Other (non HMO) | Admitting: Nurse Practitioner

## 2021-08-31 DIAGNOSIS — F10282 Alcohol dependence with alcohol-induced sleep disorder: Secondary | ICD-10-CM

## 2021-08-31 NOTE — Progress Notes (Signed)
Patient schedule today @ 2:20

## 2021-08-31 NOTE — Progress Notes (Signed)
No show for virtual visit  

## 2021-09-19 NOTE — Patient Instructions (Signed)

## 2021-09-21 ENCOUNTER — Ambulatory Visit: Payer: Self-pay | Admitting: Nurse Practitioner

## 2021-09-21 ENCOUNTER — Encounter: Payer: Self-pay | Admitting: Nurse Practitioner

## 2021-09-21 VITALS — BP 109/71 | HR 67 | Temp 98.3°F | Ht 65.0 in | Wt 241.0 lb

## 2021-09-21 DIAGNOSIS — F32A Depression, unspecified: Secondary | ICD-10-CM

## 2021-09-21 DIAGNOSIS — F10282 Alcohol dependence with alcohol-induced sleep disorder: Secondary | ICD-10-CM

## 2021-09-21 DIAGNOSIS — M1A4710 Other secondary chronic gout, right ankle and foot, without tophus (tophi): Secondary | ICD-10-CM

## 2021-09-21 DIAGNOSIS — F419 Anxiety disorder, unspecified: Secondary | ICD-10-CM

## 2021-09-21 MED ORDER — CLONAZEPAM 0.5 MG PO TABS: 0.5000 mg | ORAL_TABLET | Freq: Two times a day (BID) | ORAL | 2 refills | Status: DC | PRN

## 2021-09-21 NOTE — Assessment & Plan Note (Signed)
Suspect recent gout flare related to heavier alcohol use and not using Allopurinol.  Check labs today and recommend he continue daily Allopurinol use.

## 2021-09-21 NOTE — Assessment & Plan Note (Signed)
Chronic, ongoing.  Is minimally using Klonopin, refills sent today.  Continue minimal use of this.  Pt is aware of risks of benzo medication use to include increased sedation, respiratory suppression, falls, dependence and cardiovascular events.  Pt would like to continue treatment as benefit determined to outweigh risk.  UDS and contract next visit. Continue to recommend alternate medications and support on alcohol cessation. Recommend [facility] for therapy online.

## 2021-09-21 NOTE — Progress Notes (Signed)
BP 109/71   Pulse 67   Temp 98.3 F (36.8 C) (Oral)   Ht _0  (1.651 m)   Wt 241 lb (109.3 kg)   SpO2 95%   BMI 40.10 kg/m    Subjective:    Patient ID: Nathan Haws., male    DOB: 1985-03-03, 36 y.o.   MRN: [id number]  HPI: Nathan Skoda. is a 36 y.o. male  Chief Complaint  Patient presents with   Joint Swelling    Patient says his legs look a whole lot better than they did. Patient says he has swelling in his legs, ankles and upper extremities. Patient denies having taken any anti-inflammatory medication other than Ibuprofen.    ALCOHOL ABUSE: Was in Detox program in [place] for 16 days a couple months back.  He has been placed on Keppra 500 MG BID and Vistaril 50 MG TID PRN  His reason for drinking has always been because he can not sleep/stress, the Vistaril has been helping him sleep.  Did have relapse July 6th after loss of job July 3rd, which was a trigger for him.  He got in contact with a friend, whose mom is a Marine scientist and she assisted with sober treatment July 8th.  Would like refill on Klonopin, rarely uses. Last fill > 1 year ago on review.   He continues to go to meetings since returning home -- [employer].  Is also doing [employer].     09/21/2021    3:09 PM 08/28/2021    2:37 PM 07/03/2021   10:44 AM 12/26/2020    2:04 PM 06/24/2020    2:53 PM  Depression screen PHQ 2/9  Decreased Interest 3 1 0 0 1  Down, Depressed, Hopeless 3 1 0 0 1  PHQ - 2 Score 6 2 0 0 2  Altered sleeping 3 1 0 0 3  Tired, decreased energy _1 Change in appetite 3 0 0 0 3  Feeling bad or failure about yourself  3 0 0 0 2  Trouble concentrating 1 0 0 0 0  Moving slowly or fidgety/restless 2 0 0 0 3  Suicidal thoughts   0 0 0  PHQ-9 Score _2 Difficult doing work/chores Somewhat difficult Somewhat difficult Not difficult at all         09/21/2021    3:09 PM 08/28/2021    2:37 PM 12/26/2020    2:05 PM 06/24/2020    2:55 PM  GAD 7 : Generalized Anxiety Score   Nervous, Anxious, on Edge _3 Control/stop worrying _4 Worry too much - different things _5 Trouble relaxing _6 Restless _7 Easily annoyed or irritable 0 3 0 1  Afraid - awful might happen _8 Total GAD 7 Score _9 Anxiety Difficulty Somewhat difficult Somewhat difficult Somewhat difficult     ARTHRALGIAS / JOINT ACHES This took place after heavy alcohol use and improved while under sober treatment July 8th. Joint pain and swelling now improving since have been sober.  History of gout, he had stopped taking medication during recent period and restarted. Duration: days Pain: no Symmetric: yes Context:  better Decreased function/range of motion: no Erythema: no Swelling: yes Heat or warmth: no Morning stiffness: none Aggravating factors:  Alleviating factors:  Relief with NSAIDs?: No  NSAIDs Taken Treatments attempted:  rest  Involved Joints:     Hands: yes bilateral    Wrists: none    Elbows: none    Shoulders: none    Back: none    Hips: none    Knees: yes bilateral    Ankles: yes bilateral    Feet: yes bilateral   Relevant past medical, surgical, family and social history reviewed and updated as indicated. Interim medical history since our last visit reviewed. Allergies and medications reviewed and updated.  Review of Systems  Constitutional:  Negative for activity change, diaphoresis, fatigue and fever.  Respiratory:  Negative for cough, chest tightness, shortness of breath and wheezing.   Cardiovascular:  Negative for chest pain, palpitations and leg swelling.  Gastrointestinal: Negative.   Neurological: Negative.   Psychiatric/Behavioral:  Negative for decreased concentration, self-injury, sleep disturbance and suicidal ideas.    Per HPI unless specifically indicated above     Objective:    BP 109/71   Pulse 67   Temp 98.3 F (36.8 C) (Oral)   Ht _0  (1.651 m)   Wt 241 lb (109.3 kg)   SpO2 95%   BMI 40.10 kg/m    Wt Readings from Last 3 Encounters:  09/21/21 241 lb (109.3 kg)  08/28/21 236 lb 9.6 oz (107.3 kg)  07/03/21 232 lb 6.4 oz (105.4 kg)    Physical Exam Vitals and nursing note reviewed.  Constitutional:      General: He is not in acute distress.    Appearance: He is well-developed and well-groomed. He is obese. He is not ill-appearing.  HENT:     Head: Normocephalic and atraumatic.     Right Ear: Hearing normal. No drainage.     Left Ear: Hearing normal. No drainage.  Eyes:     General: Lids are normal. No scleral icterus.       Right eye: No discharge.        Left eye: No discharge.     Conjunctiva/sclera: Conjunctivae normal.     Pupils: Pupils are equal, round, and reactive to light.  Neck:     Thyroid: No thyromegaly.     Vascular: No carotid bruit.  Cardiovascular:     Rate and Rhythm: Normal rate and regular rhythm.     Heart sounds: Normal heart sounds, S1 normal and S2 normal. No murmur heard.    No gallop.  Pulmonary:     Effort: Pulmonary effort is normal. No accessory muscle usage or respiratory distress.     Breath sounds: Normal breath sounds.  Abdominal:     General: Bowel sounds are normal. There is no distension.     Palpations: Abdomen is soft. There is no hepatomegaly.     Tenderness: There is no abdominal tenderness.  Musculoskeletal:        General: Normal range of motion.     Cervical back: Normal range of motion and neck supple.     Right lower leg: No edema.     Left lower leg: No edema.  Skin:    General: Skin is warm and dry.  Neurological:     Mental Status: He is alert and oriented to person, place, and time.  Psychiatric:        Mood and Affect: Mood normal.        Speech: Speech normal.        Behavior: Behavior normal.        Thought Content: Thought content normal.    Results for  orders placed or performed in visit on 07/03/21  CBC with Differential/Platelet  Result Value Ref Range   WBC 9.9 3.4 - 10.8 x10E3/uL   RBC 4.91 4.14 -  5.80 x10E6/uL   Hemoglobin 16.9 13.0 - 17.7 g/dL   Hematocrit 48.5 37.5 - 51.0 %   MCV 99 (H) 79 - 97 fL   MCH 34.4 (H) 26.6 - 33.0 pg   MCHC 34.8 31.5 - 35.7 g/dL   RDW 11.9 11.6 - 15.4 %   Platelets 218 150 - 450 x10E3/uL   Neutrophils 63 Not Estab. %   Lymphs 27 Not Estab. %   Monocytes 6 Not Estab. %   Eos 3 Not Estab. %   Basos 1 Not Estab. %   Neutrophils Absolute 6.3 1.4 - 7.0 x10E3/uL   Lymphocytes Absolute 2.6 0.7 - 3.1 x10E3/uL   Monocytes Absolute 0.6 0.1 - 0.9 x10E3/uL   EOS (ABSOLUTE) 0.3 0.0 - 0.4 x10E3/uL   Basophils Absolute 0.1 0.0 - 0.2 x10E3/uL   Immature Granulocytes 0 Not Estab. %   Immature Grans (Abs) 0.0 0.0 - 0.1 x10E3/uL  Comprehensive metabolic panel  Result Value Ref Range   Glucose 84 70 - 99 mg/dL   BUN 13 6 - 20 mg/dL   Creatinine, Ser 0.99 0.76 - 1.27 mg/dL   eGFR 101 >59 mL/min/1.73   BUN/Creatinine Ratio 13 9 - 20   Sodium 138 134 - 144 mmol/L   Potassium 4.5 3.5 - 5.2 mmol/L   Chloride 100 96 - 106 mmol/L   CO2 23 20 - 29 mmol/L   Calcium 10.0 8.7 - 10.2 mg/dL   Total Protein 7.5 6.0 - 8.5 g/dL   Albumin 4.6 4.0 - 5.0 g/dL   Globulin, Total 2.9 1.5 - 4.5 g/dL   Albumin/Globulin Ratio 1.6 1.2 - 2.2   Bilirubin Total 0.6 0.0 - 1.2 mg/dL   Alkaline Phosphatase 82 44 - 121 IU/L   AST 38 0 - 40 IU/L   ALT 47 (H) 0 - 44 IU/L  Lipid Panel w/o Chol/HDL Ratio  Result Value Ref Range   Cholesterol, Total 213 (H) 100 - 199 mg/dL   Triglycerides 145 0 - 149 mg/dL   HDL 27 (L) >39 mg/dL   VLDL Cholesterol Cal 27 5 - 40 mg/dL   LDL Chol Calc (NIH) 159 (H) 0 - 99 mg/dL  TSH  Result Value Ref Range   TSH 1.340 0.450 - 4.500 uIU/mL  Vitamin B12  Result Value Ref Range   Vitamin B-12 744 232 - 1,245 pg/mL      Assessment & Plan:   Problem List Items Addressed This Visit       Other   Anxiety and depression    Chronic, ongoing.  Is minimally using Klonopin, refills sent today.  Continue minimal use of this.  Pt is aware of risks of benzo  medication use to include increased sedation, respiratory suppression, falls, dependence and cardiovascular events.  Pt would like to continue treatment as benefit determined to outweigh risk.  UDS and contract next visit. Continue to recommend alternate medications and support on alcohol cessation. Recommend [facility] for therapy online.       EtOH dependence ([place]) - Primary    Had recent relapse after multiple days of being sober, is now sober again over past week.  Will continue Keppra and Vistaril at this time as are offering him benefit.  Refills up to date.  Recommend therapy via [employer] and that he continue [employer] meetings  and obtain sponsor. Check labs today. Klonopin refill sent, is aware not to take with alcohol, minimally uses == for trigger with increased stressors.      Relevant Orders   CBC with Differential/Platelet   Comprehensive metabolic panel   Gout    Suspect recent gout flare related to heavier alcohol use and not using Allopurinol.  Check labs today and recommend he continue daily Allopurinol use.      Relevant Orders   Uric acid     Follow up plan: Return in about 4 weeks (around 10/19/2021) for ALCOHOL USE, MOOD -- need controlled subs contract.

## 2021-09-21 NOTE — Assessment & Plan Note (Signed)
Had recent relapse after multiple days of being sober, is now sober again over past week.  Will continue Keppra and Vistaril at this time as are offering him benefit.  Refills up to date.  Recommend therapy via [facility] and that he continue [employer] meetings and obtain sponsor. Check labs today. Klonopin refill sent, is aware not to take with alcohol, minimally uses == for trigger with increased stressors.

## 2021-09-22 LAB — CBC WITH DIFFERENTIAL/PLATELET
Basophils Absolute: 0.1 10*3/uL (ref 0.0–0.2)
Basos: 1 %
EOS (ABSOLUTE): 0.1 10*3/uL (ref 0.0–0.4)
Eos: 2 %
Hematocrit: 43.9 % (ref 37.5–51.0)
Hemoglobin: 14.9 g/dL (ref 13.0–17.7)
Immature Grans (Abs): 0 10*3/uL (ref 0.0–0.1)
Immature Granulocytes: 0 %
Lymphocytes Absolute: 2.6 10*3/uL (ref 0.7–3.1)
Lymphs: 31 %
MCH: 31.6 pg (ref 26.6–33.0)
MCHC: 33.9 g/dL (ref 31.5–35.7)
MCV: 93 fL (ref 79–97)
Monocytes Absolute: 0.6 10*3/uL (ref 0.1–0.9)
Monocytes: 7 %
Neutrophils Absolute: 5.1 10*3/uL (ref 1.4–7.0)
Neutrophils: 59 %
Platelets: 220 10*3/uL (ref 150–450)
RBC: 4.72 x10E6/uL (ref 4.14–5.80)
RDW: 12.6 % (ref 11.6–15.4)
WBC: 8.6 10*3/uL (ref 3.4–10.8)

## 2021-09-22 LAB — COMPREHENSIVE METABOLIC PANEL
ALT: 50 IU/L — ABNORMAL HIGH (ref 0–44)
AST: 40 IU/L (ref 0–40)
Albumin/Globulin Ratio: 1.5 (ref 1.2–2.2)
Albumin: 4.6 g/dL (ref 4.1–5.1)
Alkaline Phosphatase: 79 IU/L (ref 44–121)
BUN/Creatinine Ratio: 9 (ref 9–20)
BUN: 10 mg/dL (ref 6–20)
Bilirubin Total: 0.4 mg/dL (ref 0.0–1.2)
CO2: 22 mmol/L (ref 20–29)
Calcium: 9.8 mg/dL (ref 8.7–10.2)
Chloride: 105 mmol/L (ref 96–106)
Creatinine, Ser: 1.11 mg/dL (ref 0.76–1.27)
Globulin, Total: 3 g/dL (ref 1.5–4.5)
Glucose: 107 mg/dL — ABNORMAL HIGH (ref 70–99)
Potassium: 4.4 mmol/L (ref 3.5–5.2)
Sodium: 145 mmol/L — ABNORMAL HIGH (ref 134–144)
Total Protein: 7.6 g/dL (ref 6.0–8.5)
eGFR: 88 mL/min/{1.73_m2} (ref >59)

## 2021-09-22 LAB — URIC ACID: Uric Acid: 6.1 mg/dL (ref 3.8–8.4)

## 2021-09-22 NOTE — Progress Notes (Signed)
Contacted via [place] [facility] [place], your labs have returned: - Sodium level a little elevated, cut back on salt intake and increase water intake. - Kidney function, creatinine and eGFR, remains normal.  Liver function, AST and ALT, overall is remaining similar to previous with only mild elevation in ALT. - CBC shows no anemia or infection. - Uric acid did trend up some from previous checks.  I suspect you may have had a gout flare recently based on your history and labs -- like to see uric acid below 6.  So continue Allopurinol daily and drink water.  Avoid red meat, shrimp, and alcohol.  Any questions? Keep being stellar!!  Thank you for allowing me to participate in your care.  I appreciate you. Kindest regards, Whitleigh Garramone

## 2021-10-11 ENCOUNTER — Encounter: Payer: Self-pay | Admitting: Nurse Practitioner

## 2021-10-19 ENCOUNTER — Ambulatory Visit: Payer: Self-pay | Admitting: Nurse Practitioner

## 2021-10-20 ENCOUNTER — Ambulatory Visit (INDEPENDENT_AMBULATORY_CARE_PROVIDER_SITE_OTHER): Payer: Self-pay | Admitting: Nurse Practitioner

## 2021-10-20 ENCOUNTER — Encounter: Payer: Self-pay | Admitting: Nurse Practitioner

## 2021-10-20 VITALS — BP 95/60 | HR 53 | Temp 98.2°F | Ht 65.0 in | Wt 242.4 lb

## 2021-10-20 DIAGNOSIS — M2550 Pain in unspecified joint: Secondary | ICD-10-CM | POA: Insufficient documentation

## 2021-10-20 DIAGNOSIS — F10282 Alcohol dependence with alcohol-induced sleep disorder: Secondary | ICD-10-CM

## 2021-10-20 DIAGNOSIS — F32A Depression, unspecified: Secondary | ICD-10-CM

## 2021-10-20 DIAGNOSIS — M25541 Pain in joints of right hand: Secondary | ICD-10-CM

## 2021-10-20 DIAGNOSIS — F419 Anxiety disorder, unspecified: Secondary | ICD-10-CM

## 2021-10-20 MED ORDER — HYDROXYZINE PAMOATE 25 MG PO CAPS: ORAL_CAPSULE | ORAL | 4 refills | Status: DC

## 2021-10-20 NOTE — Assessment & Plan Note (Signed)
Chronic, ongoing.  Is minimally using Klonopin, refills sent last visit .  Continue minimal use of this.  Pt is aware of risks of benzo medication use to include increased sedation, respiratory suppression, falls, dependence and cardiovascular events.  Pt would like to continue treatment as benefit determined to outweigh risk.  UDS and contract next visit. Continue to recommend alternate medications and support on alcohol cessation. Recommend [facility] for therapy online.

## 2021-10-20 NOTE — Assessment & Plan Note (Addendum)
Continues to avoid alcohol use, praised for this.  Will continue Keppra and Vistaril at this time as are offering him benefit.  Refills up to date.  Recommend therapy via [facility] and that he continue AAA meetings and obtain sponsor. Labs up to date. Klonopin refill sent last visit, is aware not to take with alcohol, minimally uses == for trigger with increased stressors.

## 2021-10-20 NOTE — Patient Instructions (Signed)
Carpal Tunnel Syndrome  Carpal tunnel syndrome is a condition that causes pain, weakness, and numbness in your hand and arm. Numbness is when you cannot feel an area in your body. The carpal tunnel is a narrow area that is on the palm side of your wrist. Repeated wrist motion or certain diseases may cause swelling in the tunnel. This swelling can pinch the main nerve in the wrist. This nerve is called the median nerve. What are the causes? This condition may be caused by: Moving your hand and wrist over and over again while doing a task. Injury to the wrist. Arthritis. A sac of fluid (cyst) or abnormal growth (tumor) in the carpal tunnel. Fluid buildup during pregnancy. Use of tools that vibrate. Sometimes the cause is not known. What increases the risk? The following factors may make you more likely to have this condition: Having a job that makes you do these things: Move your hand over and over again. Work with tools that vibrate, such as drills or sanders. Being a woman. Having diabetes, obesity, thyroid problems, or kidney failure. What are the signs or symptoms? Symptoms of this condition include: A tingling feeling in your fingers. Tingling or loss of feeling in your hand. Pain in your entire arm. This pain may get worse when you bend your wrist and elbow for a long time. Pain in your wrist that goes up your arm to your shoulder. Pain that goes down into your palm or fingers. Weakness in your hands. You may find it hard to grab and hold items. You may feel worse at night. How is this treated? This condition may be treated with: Lifestyle changes. You will be asked to stop or change the activity that caused your problem. Doing exercises and activities that make bones, muscles, and tendons stronger (physical therapy). Learning how to use your hand again (occupational therapy). Medicines for pain and swelling. You may have injections in your wrist. A wrist splint or  brace. Surgery. Follow these instructions at home: If you have a splint or brace: Wear the splint or brace as told by your doctor. Take it off only as told by your doctor. Loosen the splint if your fingers: Tingle. Become numb. Turn cold and blue. Keep the splint or brace clean. If the splint or brace is not waterproof: Do not let it get wet. Cover it with a watertight covering when you take a bath or a shower. Managing pain, stiffness, and swelling If told, put ice on the painful area: If you have a removable splint or brace, remove it as told by your doctor. Put ice in a plastic bag. Place a towel between your skin and the bag. Leave the ice on for 20 minutes, 2-3 times per day. Do not fall asleep with the cold pack on your skin. Take off the ice if your skin turns bright red. This is very important. If you cannot feel pain, heat, or cold, you have a greater risk of damage to the area. Move your fingers often to reduce stiffness and swelling. General instructions Take over-the-counter and prescription medicines only as told by your doctor. Rest your wrist from any activity that may cause pain. If needed, talk with your boss at work about changes that can help your wrist heal. Do exercises as told by your doctor, physical therapist, or occupational therapist. Keep all follow-up visits. Contact a doctor if: You have new symptoms. Medicine does not help your pain. Your symptoms get worse. Get help right  away if: You have very bad numbness or tingling in your wrist or hand. Summary Carpal tunnel syndrome is a condition that causes pain in your hand and arm. It is often caused by repeated wrist motions. Lifestyle changes and medicines are used to treat this problem. Surgery may help in very bad cases. Follow your doctor's instructions about wearing a splint, resting your wrist, keeping follow-up visits, and calling for help. This information is not intended to replace advice given  to you by your health care provider. Make sure you discuss any questions you have with your health care provider. Document Revised: 06/28/2019 Document Reviewed: 06/28/2019 Elsevier Patient Education  2023 Elsevier Inc.  

## 2021-10-20 NOTE — Progress Notes (Signed)
BP 95/60   Pulse (!) 53   Temp 98.2 F (36.8 C) (Oral)   Ht '5\' 5"'  (1.651 m)   Wt 242 lb 6.4 oz (110 kg)   SpO2 97%   BMI 40.34 kg/m    Subjective:    Patient ID: Nathan Haws., male    DOB: 05/21/1985, 36 y.o.   MRN: [id number]  HPI: Nathan Pollok. is a 36 y.o. male  Chief Complaint  Patient presents with   Mood    Patient says having a difficulty time, as [name] loss his new job and says [name] has felonies and no job. Patient says [name] is not doing good.   Alcohol Problem   ALCOHOL ABUSE & MOOD: Was in Detox program in [place] for 16 days a couple months back. Taking Keppra 500 MG BID and Vistaril 50 MG TID PRN + has Klonopin (has been on for years) as needed.  Lost old job (through no fault of own, they reported [name] took $50,000 in appliances which [name] did not -- to get felony off records it would take 3 years and a trial) and then started a new place of employment and lost that job, [name] feels they talked to old company and that led to loss of new job.  Trigger for him at this time mood wise, as [name] can not figure out what happened and what was wrong.  [name] has not had any alcohol with these changes in life.   Has not been to recent [place] meeting, has not needed -- [employer]. Does online meetings on occasion. Mood status: stable Satisfied with current treatment?: yes Symptom severity: moderate  Duration of current treatment : chronic Side effects: no Medication compliance: good compliance Psychotherapy/counseling: AA meetings Previous psychiatric medications: Ativan Depressed mood: no Anxious mood: yes Anhedonia: no Significant weight loss or gain: no Insomnia: yes hard to fall asleep Fatigue: no Feelings of worthlessness or guilt: no Impaired concentration/indecisiveness: no Suicidal ideations: no Hopelessness: no Crying spells: no    10/20/2021   11:20 AM 09/21/2021    3:09 PM 08/28/2021    2:37 PM 07/03/2021   10:44 AM 12/26/2020    2:04 PM  Depression screen PHQ 2/9   Decreased Interest '1 3 1 ' 0 0  Down, Depressed, Hopeless '3 3 1 ' 0 0  PHQ - 2 Score '4 6 2 ' 0 0  Altered sleeping '3 3 1 ' 0 0  Tired, decreased energy '3 3 1 1 1  ' Change in appetite 3 3 0 0 0  Feeling bad or failure about yourself  3 3 0 0 0  Trouble concentrating 1 1 0 0 0  Moving slowly or fidgety/restless 2 2 0 0 0  Suicidal thoughts 2   0 0  PHQ-9 Score '21 21 4 1 1  ' Difficult doing work/chores Very difficult Somewhat difficult Somewhat difficult Not difficult at all        10/20/2021   11:21 AM 09/21/2021    3:09 PM 08/28/2021    2:37 PM 12/26/2020    2:05 PM  GAD 7 : Generalized Anxiety Score  Nervous, Anxious, on Edge '1 1 2 2  ' Control/stop worrying '1 3 2 2  ' Worry too much - different things '1 3 2 2  ' Trouble relaxing '1 3 2 2  ' Restless '1 3  2  ' Easily annoyed or irritable 2 0 3 0  Afraid - awful might happen '2 3 3 2  ' Total GAD 7 Score 9 16  12  Anxiety Difficulty Very difficult Somewhat difficult Somewhat difficult Somewhat difficult   ARTHRALGIAS / JOINT ACHES Joint pain and swelling now improving since have been sober -- taking Allopurinol daily.  Diet has changed and not drinking.  Pins and needles to three fingers (1st to 3rd) on right hand (dominant side). Duration: days Pain: no Symmetric: yes Context:  better Decreased function/range of motion: no Erythema: no Swelling: yes Heat or warmth: no Morning stiffness: none Aggravating factors:  Alleviating factors:  Relief with NSAIDs?: No NSAIDs Taken Treatments attempted:  rest  Involved Joints:     Hands: yes bilateral    Wrists: none    Elbows: none    Shoulders: none    Back: none    Hips: none    Knees: yes bilateral    Ankles: yes bilateral    Feet: yes bilateral   Relevant past medical, surgical, family and social history reviewed and updated as indicated. Interim medical history since our last visit reviewed. Allergies and medications reviewed and updated.  Review of Systems  Constitutional:  Negative  for activity change, diaphoresis, fatigue and fever.  Respiratory:  Negative for cough, chest tightness, shortness of breath and wheezing.   Cardiovascular:  Negative for chest pain, palpitations and leg swelling.  Gastrointestinal: Negative.   Neurological: Negative.   Psychiatric/Behavioral:  Negative for decreased concentration, self-injury, sleep disturbance and suicidal ideas.    Per HPI unless specifically indicated above     Objective:    BP 95/60   Pulse (!) 53   Temp 98.2 F (36.8 C) (Oral)   Ht '5\' 5"'  (1.651 m)   Wt 242 lb 6.4 oz (110 kg)   SpO2 97%   BMI 40.34 kg/m   Wt Readings from Last 3 Encounters:  10/20/21 242 lb 6.4 oz (110 kg)  09/21/21 241 lb (109.3 kg)  08/28/21 236 lb 9.6 oz (107.3 kg)    Physical Exam Vitals and nursing note reviewed.  Constitutional:      General: [name] is not in acute distress.    Appearance: [name] is well-developed and well-groomed. [name] is obese. [name] is not ill-appearing.  HENT:     Head: Normocephalic and atraumatic.     Right Ear: Hearing normal. No drainage.     Left Ear: Hearing normal. No drainage.  Eyes:     General: Lids are normal. No scleral icterus.       Right eye: No discharge.        Left eye: No discharge.     Conjunctiva/sclera: Conjunctivae normal.     Pupils: Pupils are equal, round, and reactive to light.  Neck:     Thyroid: No thyromegaly.     Vascular: No carotid bruit.  Cardiovascular:     Rate and Rhythm: Normal rate and regular rhythm.     Heart sounds: Normal heart sounds, S1 normal and S2 normal. No murmur heard.    No gallop.  Pulmonary:     Effort: Pulmonary effort is normal. No accessory muscle usage or respiratory distress.     Breath sounds: Normal breath sounds.  Abdominal:     General: Bowel sounds are normal. There is no distension.     Palpations: Abdomen is soft. There is no hepatomegaly.     Tenderness: There is no abdominal tenderness.  Musculoskeletal:        General: Normal range of  motion.     Cervical back: Normal range of motion and neck supple.     Right  lower leg: No edema.     Left lower leg: No edema.  Skin:    General: Skin is warm and dry.  Neurological:     Mental Status: [name] is alert and oriented to person, place, and time.  Psychiatric:        Mood and Affect: Mood normal.        Speech: Speech normal.        Behavior: Behavior normal.        Thought Content: Thought content normal.    Results for orders placed or performed in visit on 09/21/21  CBC with Differential/Platelet  Result Value Ref Range   WBC 8.6 3.4 - 10.8 x10E3/uL   RBC 4.72 4.14 - 5.80 x10E6/uL   Hemoglobin 14.9 13.0 - 17.7 g/dL   Hematocrit 43.9 37.5 - 51.0 %   MCV 93 79 - 97 fL   MCH 31.6 26.6 - 33.0 pg   MCHC 33.9 31.5 - 35.7 g/dL   RDW 12.6 11.6 - 15.4 %   Platelets 220 150 - 450 x10E3/uL   Neutrophils 59 Not Estab. %   Lymphs 31 Not Estab. %   Monocytes 7 Not Estab. %   Eos 2 Not Estab. %   Basos 1 Not Estab. %   Neutrophils Absolute 5.1 1.4 - 7.0 x10E3/uL   Lymphocytes Absolute 2.6 0.7 - 3.1 x10E3/uL   Monocytes Absolute 0.6 0.1 - 0.9 x10E3/uL   EOS (ABSOLUTE) 0.1 0.0 - 0.4 x10E3/uL   Basophils Absolute 0.1 0.0 - 0.2 x10E3/uL   Immature Granulocytes 0 Not Estab. %   Immature Grans (Abs) 0.0 0.0 - 0.1 x10E3/uL  Comprehensive metabolic panel  Result Value Ref Range   Glucose 107 (H) 70 - 99 mg/dL   BUN 10 6 - 20 mg/dL   Creatinine, Ser 1.11 0.76 - 1.27 mg/dL   eGFR 88 >59 mL/min/1.73   BUN/Creatinine Ratio 9 9 - 20   Sodium 145 (H) 134 - 144 mmol/L   Potassium 4.4 3.5 - 5.2 mmol/L   Chloride 105 96 - 106 mmol/L   CO2 22 20 - 29 mmol/L   Calcium 9.8 8.7 - 10.2 mg/dL   Total Protein 7.6 6.0 - 8.5 g/dL   Albumin 4.6 4.1 - 5.1 g/dL   Globulin, Total 3.0 1.5 - 4.5 g/dL   Albumin/Globulin Ratio 1.5 1.2 - 2.2   Bilirubin Total 0.4 0.0 - 1.2 mg/dL   Alkaline Phosphatase 79 44 - 121 IU/L   AST 40 0 - 40 IU/L   ALT 50 (H) 0 - 44 IU/L  Uric acid  Result Value Ref  Range   Uric Acid 6.1 3.8 - 8.4 mg/dL      Assessment & Plan:   Problem List Items Addressed This Visit       Other   Anxiety and depression    Chronic, ongoing.  Is minimally using Klonopin, refills sent last visit .  Continue minimal use of this.  Pt is aware of risks of benzo medication use to include increased sedation, respiratory suppression, falls, dependence and cardiovascular events.  Pt would like to continue treatment as benefit determined to outweigh risk.  UDS and contract next visit. Continue to recommend alternate medications and support on alcohol cessation. Recommend [facility] for therapy online.       Relevant Medications   hydrOXYzine (VISTARIL) 25 MG capsule   Arthralgia    Suspect carpal tunnel present based on HPI and exam.  At this time recommend wearing brace during activity  and at night, this has offered him benefit in past + use of Voltaren gel as needed.        EtOH dependence ([place]) - Primary    Continues to avoid alcohol use, praised for this.  Will continue Keppra and Vistaril at this time as are offering him benefit.  Refills up to date.  Recommend therapy via [facility] and that [name] continue AAA meetings and obtain sponsor. Labs up to date. Klonopin refill sent last visit, is aware not to take with alcohol, minimally uses == for trigger with increased stressors.        Follow up plan: Return in about 6 months (around 04/22/2022) for HTN/HLD, ALCOHOL USE, ARTHRALGIAS, MOOD.

## 2021-10-20 NOTE — Assessment & Plan Note (Signed)
Suspect carpal tunnel present based on HPI and exam.  At this time recommend wearing brace during activity and at night, this has offered him benefit in past + use of Voltaren gel as needed.

## 2021-11-09 ENCOUNTER — Encounter: Payer: Self-pay | Admitting: Nurse Practitioner

## 2021-11-09 MED ORDER — CLONAZEPAM 0.5 MG PO TABS: 0.5000 mg | ORAL_TABLET | Freq: Two times a day (BID) | ORAL | 2 refills | Status: DC | PRN

## 2021-11-09 NOTE — Addendum Note (Signed)
Addended by: Marnee Guarneri T on: 11/09/2021 01:12 PM   Modules accepted: Orders

## 2021-11-09 NOTE — Addendum Note (Signed)
Addended by: Marnee Guarneri T on: 11/09/2021 01:13 PM   Modules accepted: Orders

## 2021-12-21 ENCOUNTER — Encounter: Payer: Self-pay | Admitting: Nurse Practitioner

## 2022-02-15 ENCOUNTER — Other Ambulatory Visit: Payer: Self-pay | Admitting: Nurse Practitioner

## 2022-02-16 NOTE — Telephone Encounter (Signed)
Requested medication (s) are due for refill today -yes  Requested medication (s) are on the active medication list -yes  Future visit scheduled -yes  Last refill: 11/09/21 #30 2RF  Notes to clinic: non delegated Rx  Requested Prescriptions  Pending Prescriptions Disp Refills   clonazePAM (KLONOPIN) 0.5 MG tablet [Pharmacy Med Name: CLONAZEPAM 0.5 MG TABLET] 30 tablet 2    Sig: TAKE 1 TABLET BY MOUTH 2 TIMES DAILY AS NEEDED FOR ANXIETY.     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

## 2022-03-12 ENCOUNTER — Encounter: Payer: Self-pay | Admitting: Nurse Practitioner

## 2022-03-14 NOTE — Patient Instructions (Addendum)
Tonsilloliths Sore Throat When you have a sore throat, your throat may feel: Tender. Burning. Irritated. Scratchy. Painful when you swallow. Painful when you talk. Many things can cause a sore throat, such as: An infection. Allergies. Dry air. Smoke or pollution. Radiation treatment for cancer. Gastroesophageal reflux disease (GERD). A tumor. A sore throat can be the first sign of another sickness. It can happen with other problems, like: Coughing. Sneezing. Fever. Swelling of the glands in the neck. Most sore throats go away without treatment. Follow these instructions at home:     Medicines Take over-the-counter and prescription medicines only as told by your doctor. Children often get sore throats. Do not give your child aspirin. Use throat sprays to soothe your throat as told by your health care provider. Managing pain To help with pain: Sip warm liquids, such as broth, herbal tea, or warm water. Eat or drink cold or frozen liquids, such as frozen ice pops. Rinse your mouth (gargle) with a salt water mixture 3-4 times a day or as needed. To make salt water, dissolve -1 tsp (3-6 g) of salt in 1 cup (237 mL) of warm water. Do not swallow this mixture. Suck on hard candy or throat lozenges. Put a cool-mist humidifier in your bedroom at night. Sit in the bathroom with the door closed for 5-10 minutes while you run hot water in the shower. General instructions Do not smoke or use any products that contain nicotine or tobacco. If you need help quitting, ask your doctor. Get plenty of rest. Drink enough fluid to keep your pee (urine) pale yellow. Wash your hands often for at least 20 seconds with soap and water. If soap and water are not available, use hand sanitizer. Contact a doctor if: You have a fever for more than 2-3 days. You keep having symptoms for more than 2-3 days. Your throat does not get better in 7 days. You have a fever and your symptoms suddenly get  worse. Your child who is 3 months to 43 years old has a temperature of 102.61F (39C) or higher. Get help right away if: You have trouble breathing. You cannot swallow fluids, soft foods, or your spit. You have swelling in your throat or neck that gets worse. You feel like you may vomit (nauseous) and this feeling lasts a long time. You cannot stop vomiting. These symptoms may be an emergency. Get help right away. Call your local emergency services (911 in the [place] Do not wait to see if the symptoms will go away. Do not drive yourself to the hospital. Summary A sore throat is a painful, burning, irritated, or scratchy throat. Many things can cause a sore throat. Take over-the-counter medicines only as told by your doctor. Get plenty of rest. Drink enough fluid to keep your pee (urine) pale yellow. Contact a doctor if your symptoms get worse or your sore throat does not get better within 7 days. This information is not intended to replace advice given to you by your health care provider. Make sure you discuss any questions you have with your health care provider. Document Revised: 05/14/2020 Document Reviewed: 05/14/2020 Elsevier Patient Education  [name].

## 2022-03-17 ENCOUNTER — Ambulatory Visit (INDEPENDENT_AMBULATORY_CARE_PROVIDER_SITE_OTHER): Payer: Self-pay | Admitting: Nurse Practitioner

## 2022-03-17 ENCOUNTER — Encounter: Payer: Self-pay | Admitting: Nurse Practitioner

## 2022-03-17 VITALS — BP 118/76 | HR 66 | Temp 98.4°F | Ht 65.0 in | Wt 258.3 lb

## 2022-03-17 DIAGNOSIS — J358 Other chronic diseases of tonsils and adenoids: Secondary | ICD-10-CM | POA: Insufficient documentation

## 2022-03-17 MED ORDER — HYDROXYZINE PAMOATE 50 MG PO CAPS: 50.0000 mg | ORAL_CAPSULE | Freq: Three times a day (TID) | ORAL | 4 refills | Status: DC

## 2022-03-17 MED ORDER — LIDOCAINE VISCOUS HCL 2 % MT SOLN: 15.0000 mL | OROMUCOSAL | 0 refills | Status: DC | PRN

## 2022-03-17 NOTE — Assessment & Plan Note (Signed)
Acute to right side.  Will send in Viscous lidocaine for symptoms relief as needed.  Educated him on these.  If ongoing issues with this then refer to ENT.  At this time recommend salt water gargles as needed, current symptoms improving.

## 2022-03-17 NOTE — Progress Notes (Signed)
BP 118/76   Pulse 66   Temp 98.4 F (36.9 C) (Oral)   Ht '5\' 5"'$  (1.651 m)   Wt 258 lb 4.8 oz (117.2 kg)   SpO2 94%   BMI 42.98 kg/m    Subjective:    Patient ID: Nathan Haws., male    DOB: 12-22-1985, 37 y.o.   MRN: [id number]  HPI: Nathan Wilford. is a 37 y.o. male  Chief Complaint  Patient presents with   Sore Throat    Patient states that he was having issues with a sore throat over the weekend that has resolved. Patient states when his throat started to get sore he states that he found it hard to breath and wants to know why and what he can do about it.   SORE THROAT  Noted a sore throat on weekend, had a large, thick thing present from back of throat.  This made him gag. Has no history of tonsillar stones.  Found it hard to breath when this thing moved over airway.  Sore throat has since gradually gotten better since this thing came out -- almost 100% better.   Fever: yes Cough: no Shortness of breath: no Wheezing: no Chest pain: no Chest tightness: no Chest congestion: no Nasal congestion: no Runny nose: no Post nasal drip: no Sneezing: no Sore throat: yes -- Thursday night initially presented Swollen glands: yes Sinus pressure: no Headache: no Face pain: no Toothache: no Ear pain: none Ear pressure: none Eyes red/itching:no Eye drainage/crusting: no  Vomiting: no Rash: no Fatigue: yes Sick contacts: no Strep contacts: no  Context: stable Recurrent sinusitis: no Relief with OTC cold/cough medications: yes  Treatments attempted: did salt water gargling    Relevant past medical, surgical, family and social history reviewed and updated as indicated. Interim medical history since our last visit reviewed. Allergies and medications reviewed and updated.  Review of Systems  Constitutional:  Negative for activity change, appetite change, chills, diaphoresis, fatigue and fever.  HENT:  Positive for sore throat (improving). Negative for congestion, ear  discharge, ear pain, postnasal drip, rhinorrhea, sinus pressure, sinus pain, sneezing and voice change.   Respiratory:  Negative for cough, chest tightness, shortness of breath and wheezing.   Cardiovascular:  Negative for chest pain, palpitations and leg swelling.  Gastrointestinal: Negative.   Neurological: Negative.   Psychiatric/Behavioral: Negative.      Per HPI unless specifically indicated above     Objective:    BP 118/76   Pulse 66   Temp 98.4 F (36.9 C) (Oral)   Ht '5\' 5"'$  (1.651 m)   Wt 258 lb 4.8 oz (117.2 kg)   SpO2 94%   BMI 42.98 kg/m   Wt Readings from Last 3 Encounters:  03/17/22 258 lb 4.8 oz (117.2 kg)  10/20/21 242 lb 6.4 oz (110 kg)  09/21/21 241 lb (109.3 kg)    Physical Exam Vitals and nursing note reviewed.  Constitutional:      General: He is awake. He is not in acute distress.    Appearance: He is well-developed and well-groomed. He is obese. He is not ill-appearing or toxic-appearing.  HENT:     Head: Normocephalic.     Right Ear: Hearing, tympanic membrane, ear canal and external ear normal. Tympanic membrane is not injected or perforated.     Left Ear: Hearing, tympanic membrane, ear canal and external ear normal. Tympanic membrane is not injected or perforated.     Nose: No rhinorrhea.  Right Sinus: No maxillary sinus tenderness or frontal sinus tenderness.     Left Sinus: No maxillary sinus tenderness or frontal sinus tenderness.     Mouth/Throat:     Mouth: Mucous membranes are moist.     Pharynx: No pharyngeal swelling, oropharyngeal exudate or posterior oropharyngeal erythema.      Comments: Mild erythema and edema to left side tonsil. No drainage. Eyes:     General: Lids are normal.     Extraocular Movements: Extraocular movements intact.     Conjunctiva/sclera: Conjunctivae normal.  Neck:     Thyroid: No thyromegaly.     Vascular: No carotid bruit.  Cardiovascular:     Rate and Rhythm: Normal rate and regular rhythm.     Heart  sounds: Normal heart sounds.  Pulmonary:     Effort: No accessory muscle usage or respiratory distress.     Breath sounds: Normal breath sounds.  Abdominal:     General: Bowel sounds are normal. There is no distension.     Palpations: Abdomen is soft.     Tenderness: There is no abdominal tenderness.  Musculoskeletal:     Cervical back: Full passive range of motion without pain.     Right lower leg: No edema.     Left lower leg: No edema.  Lymphadenopathy:     Cervical: No cervical adenopathy.  Skin:    General: Skin is warm.     Capillary Refill: Capillary refill takes less than 2 seconds.  Neurological:     Mental Status: He is alert and oriented to person, place, and time.     Deep Tendon Reflexes: Reflexes are normal and symmetric.     Reflex Scores:      Brachioradialis reflexes are 2+ on the right side and 2+ on the left side.      Patellar reflexes are 2+ on the right side and 2+ on the left side. Psychiatric:        Attention and Perception: Attention normal.        Mood and Affect: Mood normal.        Speech: Speech normal.        Behavior: Behavior normal. Behavior is cooperative.        Thought Content: Thought content normal.     Results for orders placed or performed in visit on 09/21/21  CBC with Differential/Platelet  Result Value Ref Range   WBC 8.6 3.4 - 10.8 x10E3/uL   RBC 4.72 4.14 - 5.80 x10E6/uL   Hemoglobin 14.9 13.0 - 17.7 g/dL   Hematocrit 43.9 37.5 - 51.0 %   MCV 93 79 - 97 fL   MCH 31.6 26.6 - 33.0 pg   MCHC 33.9 31.5 - 35.7 g/dL   RDW 12.6 11.6 - 15.4 %   Platelets 220 150 - 450 x10E3/uL   Neutrophils 59 Not Estab. %   Lymphs 31 Not Estab. %   Monocytes 7 Not Estab. %   Eos 2 Not Estab. %   Basos 1 Not Estab. %   Neutrophils Absolute 5.1 1.4 - 7.0 x10E3/uL   Lymphocytes Absolute 2.6 0.7 - 3.1 x10E3/uL   Monocytes Absolute 0.6 0.1 - 0.9 x10E3/uL   EOS (ABSOLUTE) 0.1 0.0 - 0.4 x10E3/uL   Basophils Absolute 0.1 0.0 - 0.2 x10E3/uL    Immature Granulocytes 0 Not Estab. %   Immature Grans (Abs) 0.0 0.0 - 0.1 x10E3/uL  Comprehensive metabolic panel  Result Value Ref Range   Glucose 107 (H) 70 - 99  mg/dL   BUN 10 6 - 20 mg/dL   Creatinine, Ser 1.11 0.76 - 1.27 mg/dL   eGFR 88 >59 mL/min/1.73   BUN/Creatinine Ratio 9 9 - 20   Sodium 145 (H) 134 - 144 mmol/L   Potassium 4.4 3.5 - 5.2 mmol/L   Chloride 105 96 - 106 mmol/L   CO2 22 20 - 29 mmol/L   Calcium 9.8 8.7 - 10.2 mg/dL   Total Protein 7.6 6.0 - 8.5 g/dL   Albumin 4.6 4.1 - 5.1 g/dL   Globulin, Total 3.0 1.5 - 4.5 g/dL   Albumin/Globulin Ratio 1.5 1.2 - 2.2   Bilirubin Total 0.4 0.0 - 1.2 mg/dL   Alkaline Phosphatase 79 44 - 121 IU/L   AST 40 0 - 40 IU/L   ALT 50 (H) 0 - 44 IU/L  Uric acid  Result Value Ref Range   Uric Acid 6.1 3.8 - 8.4 mg/dL      Assessment & Plan:   Problem List Items Addressed This Visit       Respiratory   Tonsillolith - Primary    Acute to right side.  Will send in Viscous lidocaine for symptoms relief as needed.  Educated him on these.  If ongoing issues with this then refer to ENT.  At this time recommend salt water gargles as needed, current symptoms improving.          Follow up plan: Return if symptoms worsen or fail to improve.

## 2022-04-08 ENCOUNTER — Ambulatory Visit (INDEPENDENT_AMBULATORY_CARE_PROVIDER_SITE_OTHER): Payer: Self-pay | Admitting: Family Medicine

## 2022-04-08 ENCOUNTER — Encounter: Payer: Self-pay | Admitting: Family Medicine

## 2022-04-08 VITALS — BP 137/68 | HR 65 | Temp 98.7°F | Ht 65.0 in | Wt 253.0 lb

## 2022-04-08 DIAGNOSIS — J069 Acute upper respiratory infection, unspecified: Secondary | ICD-10-CM

## 2022-04-08 DIAGNOSIS — H66002 Acute suppurative otitis media without spontaneous rupture of ear drum, left ear: Secondary | ICD-10-CM

## 2022-04-08 MED ORDER — CLINDAMYCIN HCL 300 MG PO CAPS: 300.0000 mg | ORAL_CAPSULE | Freq: Three times a day (TID) | ORAL | 0 refills | Status: DC

## 2022-04-08 NOTE — Progress Notes (Signed)
BP 137/68   Pulse 65   Temp 98.7 F (37.1 C) (Oral)   Ht '5\' 5"'$  (1.651 m)   Wt 253 lb (114.8 kg)   SpO2 93%   BMI 42.10 kg/m    Subjective:    Patient ID: [name]., male    DOB: Jul 02, 1985, 37 y.o.   MRN: [id number]  HPI: [name]. is a 37 y.o. male  Chief Complaint  Patient presents with   Cough    Chest congestion, can't taste or smell, took covid test home was neg. Symptoms started on Friday evening   UPPER RESPIRATORY TRACT INFECTION Duration: 6 days Worst symptom: cough, sore throat Fever: no Cough: yes Shortness of breath: no Wheezing: no Chest pain: yes Chest tightness: yes Chest congestion: no Nasal congestion: yes Runny nose: yes Post nasal drip: yes Sneezing: yes Sore throat: yes Swollen glands: no Sinus pressure: yes Headache: yes Face pain: no Toothache: no Ear pain: no  Ear pressure: no  Eyes red/itching:no Eye drainage/crusting: no  Vomiting: no Rash: no Fatigue: yes Sick contacts: yes Strep contacts: no  Context: better Recurrent sinusitis: no Relief with OTC cold/cough medications: no  Treatments attempted: cough syrup   Relevant past medical, surgical, family and social history reviewed and updated as indicated. Interim medical history since our last visit reviewed. Allergies and medications reviewed and updated.  Review of Systems  Constitutional: Negative.   HENT:  Positive for congestion, postnasal drip, rhinorrhea, sinus pressure, sinus pain and sore throat. Negative for dental problem, drooling, ear discharge, ear pain, facial swelling, hearing loss, mouth sores, nosebleeds, sneezing, tinnitus, trouble swallowing and voice change.   Eyes: Negative.   Respiratory:  Positive for cough. Negative for apnea, choking, chest tightness, shortness of breath, wheezing and stridor.   Cardiovascular: Negative.   Gastrointestinal: Negative.   Musculoskeletal: Negative.   Skin: Negative.   Psychiatric/Behavioral: Negative.       Per HPI unless specifically indicated above     Objective:    BP 137/68   Pulse 65   Temp 98.7 F (37.1 C) (Oral)   Ht '5\' 5"'$  (1.651 m)   Wt 253 lb (114.8 kg)   SpO2 93%   BMI 42.10 kg/m   Wt Readings from Last 3 Encounters:  04/08/22 253 lb (114.8 kg)  03/17/22 258 lb 4.8 oz (117.2 kg)  10/20/21 242 lb 6.4 oz (110 kg)    Physical Exam Vitals and nursing note reviewed.  Constitutional:      General: He is not in acute distress.    Appearance: Normal appearance. He is obese. He is not ill-appearing, toxic-appearing or diaphoretic.  HENT:     Head: Normocephalic and atraumatic.     Right Ear: Tympanic membrane, ear canal and external ear normal.     Left Ear: External ear normal. Tympanic membrane is erythematous and bulging.     Nose: Nose normal. No congestion or rhinorrhea.     Mouth/Throat:     Mouth: Mucous membranes are moist.     Pharynx: Oropharynx is clear. No oropharyngeal exudate or posterior oropharyngeal erythema.  Eyes:     General: No scleral icterus.       Right eye: No discharge.        Left eye: No discharge.     Extraocular Movements: Extraocular movements intact.     Conjunctiva/sclera: Conjunctivae normal.     Pupils: Pupils are equal, round, and reactive to light.  Cardiovascular:     Rate  and Rhythm: Normal rate and regular rhythm.     Pulses: Normal pulses.     Heart sounds: Normal heart sounds. No murmur heard.    No friction rub. No gallop.  Pulmonary:     Effort: Pulmonary effort is normal. No respiratory distress.     Breath sounds: Normal breath sounds. No stridor. No wheezing, rhonchi or rales.  Chest:     Chest wall: No tenderness.  Musculoskeletal:        General: Normal range of motion.     Cervical back: Normal range of motion and neck supple.  Skin:    General: Skin is warm and dry.     Capillary Refill: Capillary refill takes less than 2 seconds.     Coloration: Skin is not jaundiced or pale.     Findings: No bruising,  erythema, lesion or rash.  Neurological:     General: No focal deficit present.     Mental Status: He is alert and oriented to person, place, and time. Mental status is at baseline.  Psychiatric:        Mood and Affect: Mood normal.        Behavior: Behavior normal.        Thought Content: Thought content normal.        Judgment: Judgment normal.     Results for orders placed or performed in visit on 09/21/21  CBC with Differential/Platelet  Result Value Ref Range   WBC 8.6 3.4 - 10.8 x10E3/uL   RBC 4.72 4.14 - 5.80 x10E6/uL   Hemoglobin 14.9 13.0 - 17.7 g/dL   Hematocrit 43.9 37.5 - 51.0 %   MCV 93 79 - 97 fL   MCH 31.6 26.6 - 33.0 pg   MCHC 33.9 31.5 - 35.7 g/dL   RDW 12.6 11.6 - 15.4 %   Platelets 220 150 - 450 x10E3/uL   Neutrophils 59 Not Estab. %   Lymphs 31 Not Estab. %   Monocytes 7 Not Estab. %   Eos 2 Not Estab. %   Basos 1 Not Estab. %   Neutrophils Absolute 5.1 1.4 - 7.0 x10E3/uL   Lymphocytes Absolute 2.6 0.7 - 3.1 x10E3/uL   Monocytes Absolute 0.6 0.1 - 0.9 x10E3/uL   EOS (ABSOLUTE) 0.1 0.0 - 0.4 x10E3/uL   Basophils Absolute 0.1 0.0 - 0.2 x10E3/uL   Immature Granulocytes 0 Not Estab. %   Immature Grans (Abs) 0.0 0.0 - 0.1 x10E3/uL  Comprehensive metabolic panel  Result Value Ref Range   Glucose 107 (H) 70 - 99 mg/dL   BUN 10 6 - 20 mg/dL   Creatinine, Ser 1.11 0.76 - 1.27 mg/dL   eGFR 88 >59 mL/min/1.73   BUN/Creatinine Ratio 9 9 - 20   Sodium 145 (H) 134 - 144 mmol/L   Potassium 4.4 3.5 - 5.2 mmol/L   Chloride 105 96 - 106 mmol/L   CO2 22 20 - 29 mmol/L   Calcium 9.8 8.7 - 10.2 mg/dL   Total Protein 7.6 6.0 - 8.5 g/dL   Albumin 4.6 4.1 - 5.1 g/dL   Globulin, Total 3.0 1.5 - 4.5 g/dL   Albumin/Globulin Ratio 1.5 1.2 - 2.2   Bilirubin Total 0.4 0.0 - 1.2 mg/dL   Alkaline Phosphatase 79 44 - 121 IU/L   AST 40 0 - 40 IU/L   ALT 50 (H) 0 - 44 IU/L  Uric acid  Result Value Ref Range   Uric Acid 6.1 3.8 - 8.4 mg/dL  Assessment & Plan:    Problem List Items Addressed This Visit   None Visit Diagnoses     Upper respiratory tract infection, unspecified type    -  Primary   Will swab. Out of window for treatment. OK to return to work with mask on Saturday. Call with any concerns.   Relevant Medications   clindamycin (CLEOCIN) 300 MG capsule   Other Relevant Orders   Respiratory Panel w/ SARS-CoV2   Non-recurrent acute suppurative otitis media of left ear without spontaneous rupture of tympanic membrane       Will treat with clindamycin. Call with any concerns.   Relevant Medications   clindamycin (CLEOCIN) 300 MG capsule        Follow up plan: Return if symptoms worsen or fail to improve.

## 2022-04-09 LAB — RESPIRATORY PANEL W/ SARS-COV2

## 2022-04-10 LAB — SPECIMEN STATUS REPORT

## 2022-04-12 LAB — RESPIRATORY PANEL W/ SARS-COV2

## 2022-04-12 LAB — SPECIMEN STATUS REPORT

## 2022-04-18 NOTE — Patient Instructions (Incomplete)

## 2022-04-19 ENCOUNTER — Other Ambulatory Visit: Payer: Self-pay | Admitting: Nurse Practitioner

## 2022-04-20 NOTE — Telephone Encounter (Signed)
Unable to refill per protocol, Rx request is too soon. Last refill 03/26/21 for 90 and 4 refills, next refill due in March.  Requested Prescriptions  Pending Prescriptions Disp Refills   fenofibrate (TRICOR) 48 MG tablet [Pharmacy Med Name: FENOFIBRATE 48 MG TAB] 90 tablet 2    Sig: TAKE ONE TABLET BY MOUTH ONE TIME DAILY     Cardiovascular:  Antilipid - Fibric Acid Derivatives Failed - 04/19/2022 12:14 AM      Failed - ALT in normal range and within 360 days    ALT  Date Value Ref Range Status  09/21/2021 50 (H) 0 - 44 IU/L Final         Failed - Lipid Panel in normal range within the last 12 months    Cholesterol, Total  Date Value Ref Range Status  07/03/2021 213 (H) 100 - 199 mg/dL Final   LDL Chol Calc (NIH)  Date Value Ref Range Status  07/03/2021 159 (H) 0 - 99 mg/dL Final   HDL  Date Value Ref Range Status  07/03/2021 27 (L) >39 mg/dL Final   Triglycerides  Date Value Ref Range Status  07/03/2021 145 0 - 149 mg/dL Final         Passed - AST in normal range and within 360 days    AST  Date Value Ref Range Status  09/21/2021 40 0 - 40 IU/L Final         Passed - Cr in normal range and within 360 days    Creatinine, Ser  Date Value Ref Range Status  09/21/2021 1.11 0.76 - 1.27 mg/dL Final         Passed - HGB in normal range and within 360 days    Hemoglobin  Date Value Ref Range Status  09/21/2021 14.9 13.0 - 17.7 g/dL Final         Passed - HCT in normal range and within 360 days    Hematocrit  Date Value Ref Range Status  09/21/2021 43.9 37.5 - 51.0 % Final         Passed - PLT in normal range and within 360 days    Platelets  Date Value Ref Range Status  09/21/2021 220 150 - 450 x10E3/uL Final         Passed - WBC in normal range and within 360 days    WBC  Date Value Ref Range Status  09/21/2021 8.6 3.4 - 10.8 x10E3/uL Final  12/12/2020 7.8 4.0 - 10.5 K/uL Final         Passed - eGFR is 30 or above and within 360 days    GFR calc Af Amer   Date Value Ref Range Status  08/08/2019 110 >59 mL/min/1.73 Final    Comment:    **[facility] currently reports eGFR in compliance with the current**   recommendations of the [employer]. [facility] will   update reporting as new guidelines are published from the NKF-ASN   Task force.    GFR, Estimated  Date Value Ref Range Status  12/12/2020 >60 >60 mL/min Final    Comment:    (NOTE) Calculated using the CKD-EPI Creatinine Equation (2021)    eGFR  Date Value Ref Range Status  09/21/2021 88 >59 mL/min/1.73 Final         Passed - Valid encounter within last 12 months    Recent Outpatient Visits           1 week ago Upper respiratory tract infection, unspecified type  [place], [place], DO   1 month ago [place], [place] T, NP   6 months ago Alcohol dependence with alcohol-induced sleep disorder [facility])   [place], [place] T, NP   7 months ago Alcohol dependence with alcohol-induced sleep disorder [facility])   [place], Henrine Screws T, NP   7 months ago Alcohol dependence with alcohol-induced sleep disorder [facility])   [place] Venita Lick, NP       Future Appointments             In 2 days Venita Lick, NP [place], [facility]

## 2022-04-22 ENCOUNTER — Ambulatory Visit: Payer: Self-pay | Admitting: Nurse Practitioner

## 2022-04-22 DIAGNOSIS — E782 Mixed hyperlipidemia: Secondary | ICD-10-CM

## 2022-04-22 DIAGNOSIS — E6609 Other obesity due to excess calories: Secondary | ICD-10-CM

## 2022-04-22 DIAGNOSIS — F32A Depression, unspecified: Secondary | ICD-10-CM

## 2022-04-22 DIAGNOSIS — I10 Essential (primary) hypertension: Secondary | ICD-10-CM

## 2022-04-22 DIAGNOSIS — G4733 Obstructive sleep apnea (adult) (pediatric): Secondary | ICD-10-CM

## 2022-04-22 DIAGNOSIS — F10282 Alcohol dependence with alcohol-induced sleep disorder: Secondary | ICD-10-CM

## 2022-04-22 DIAGNOSIS — M1A9XX0 Chronic gout, unspecified, without tophus (tophi): Secondary | ICD-10-CM

## 2022-04-22 DIAGNOSIS — K219 Gastro-esophageal reflux disease without esophagitis: Secondary | ICD-10-CM

## 2022-04-22 DIAGNOSIS — E559 Vitamin D deficiency, unspecified: Secondary | ICD-10-CM

## 2022-04-26 ENCOUNTER — Other Ambulatory Visit: Payer: Self-pay | Admitting: Nurse Practitioner

## 2022-04-27 NOTE — Telephone Encounter (Signed)
Labs in date just out of range.  90 day refill given.  Has appt. 05/07/2022  Requested Prescriptions  Pending Prescriptions Disp Refills   fenofibrate (TRICOR) 48 MG tablet [Pharmacy Med Name: FENOFIBRATE 48 MG TAB] 90 tablet 0    Sig: TAKE ONE TABLET BY MOUTH ONE TIME DAILY     Cardiovascular:  Antilipid - Fibric Acid Derivatives Failed - 04/26/2022 12:15 AM      Failed - ALT in normal range and within 360 days    ALT  Date Value Ref Range Status  09/21/2021 50 (H) 0 - 44 IU/L Final         Failed - Lipid Panel in normal range within the last 12 months    Cholesterol, Total  Date Value Ref Range Status  07/03/2021 213 (H) 100 - 199 mg/dL Final   LDL Chol Calc (NIH)  Date Value Ref Range Status  07/03/2021 159 (H) 0 - 99 mg/dL Final   HDL  Date Value Ref Range Status  07/03/2021 27 (L) >39 mg/dL Final   Triglycerides  Date Value Ref Range Status  07/03/2021 145 0 - 149 mg/dL Final         Passed - AST in normal range and within 360 days    AST  Date Value Ref Range Status  09/21/2021 40 0 - 40 IU/L Final         Passed - Cr in normal range and within 360 days    Creatinine, Ser  Date Value Ref Range Status  09/21/2021 1.11 0.76 - 1.27 mg/dL Final         Passed - HGB in normal range and within 360 days    Hemoglobin  Date Value Ref Range Status  09/21/2021 14.9 13.0 - 17.7 g/dL Final         Passed - HCT in normal range and within 360 days    Hematocrit  Date Value Ref Range Status  09/21/2021 43.9 37.5 - 51.0 % Final         Passed - PLT in normal range and within 360 days    Platelets  Date Value Ref Range Status  09/21/2021 220 150 - 450 x10E3/uL Final         Passed - WBC in normal range and within 360 days    WBC  Date Value Ref Range Status  09/21/2021 8.6 3.4 - 10.8 x10E3/uL Final  12/12/2020 7.8 4.0 - 10.5 K/uL Final         Passed - eGFR is 30 or above and within 360 days    GFR calc Af Amer  Date Value Ref Range Status  08/08/2019 110  >59 mL/min/1.73 Final    Comment:    **[facility] currently reports eGFR in compliance with the current**   recommendations of the [employer]. [facility] will   update reporting as new guidelines are published from the NKF-ASN   Task force.    GFR, Estimated  Date Value Ref Range Status  12/12/2020 >60 >60 mL/min Final    Comment:    (NOTE) Calculated using the CKD-EPI Creatinine Equation (2021)    eGFR  Date Value Ref Range Status  09/21/2021 88 >59 mL/min/1.73 Final         Passed - Valid encounter within last 12 months    Recent Outpatient Visits           2 weeks ago Upper respiratory tract infection, unspecified type   [place],  Megan P, DO   1 month ago [place], [place] T, NP   6 months ago Alcohol dependence with alcohol-induced sleep disorder ([place])   [place], [place] T, NP   7 months ago Alcohol dependence with alcohol-induced sleep disorder ([place])   [place], [place] T, NP   7 months ago Alcohol dependence with alcohol-induced sleep disorder ([place])   [place], Barbaraann Faster, NP       Future Appointments             In 1 week [place], Barbaraann Faster, NP [place], [facility]

## 2022-05-07 ENCOUNTER — Ambulatory Visit: Payer: Self-pay | Admitting: Nurse Practitioner

## 2022-05-20 ENCOUNTER — Other Ambulatory Visit: Payer: Self-pay | Admitting: Nurse Practitioner

## 2022-05-21 NOTE — Telephone Encounter (Signed)
Requested medication (s) are due for refill today: Yes  Requested medication (s) are on the active medication list: Yes  Last refill:  02/16/22  Future visit scheduled: No  Notes to clinic:  See request.    Requested Prescriptions  Pending Prescriptions Disp Refills   clonazePAM (KLONOPIN) 0.5 MG tablet [Pharmacy Med Name: CLONAZEPAM 0.5 MG TABLET] 30 tablet 2    Sig: TAKE 1 TABLET BY MOUTH TWICE A DAY AS NEEDED FOR ANXIETY     Not Delegated - Psychiatry: Anxiolytics/Hypnotics 2 Failed - 05/20/2022  6:21 PM      Failed - This refill cannot be delegated      Failed - Urine Drug Screen completed in last 360 days      Passed - Patient is not pregnant      Passed - Valid encounter within last 6 months    Recent Outpatient Visits           1 month ago Upper respiratory tract infection, unspecified type   [place], [place], DO   2 months ago [place], [place] T, NP   7 months ago Alcohol dependence with alcohol-induced sleep disorder ([place])   [place], [place] T, NP   8 months ago Alcohol dependence with alcohol-induced sleep disorder ([place])   [place], [place] T, NP   8 months ago Alcohol dependence with alcohol-induced sleep disorder [facility])   [place], Barbaraann Faster, NP

## 2022-05-31 ENCOUNTER — Ambulatory Visit: Payer: Self-pay | Admitting: *Deleted

## 2022-05-31 ENCOUNTER — Ambulatory Visit (INDEPENDENT_AMBULATORY_CARE_PROVIDER_SITE_OTHER): Payer: Self-pay | Admitting: Nurse Practitioner

## 2022-05-31 ENCOUNTER — Encounter: Payer: Self-pay | Admitting: Nurse Practitioner

## 2022-05-31 VITALS — BP 114/77 | HR 71 | Temp 98.3°F | Ht 65.0 in | Wt 256.3 lb

## 2022-05-31 DIAGNOSIS — R051 Acute cough: Secondary | ICD-10-CM | POA: Insufficient documentation

## 2022-05-31 LAB — VERITOR FLU A/B WAIVED
Influenza A: NEGATIVE
Influenza B: NEGATIVE

## 2022-05-31 MED ORDER — PREDNISONE 20 MG PO TABS: 40.0000 mg | ORAL_TABLET | Freq: Every day | ORAL | 0 refills | Status: AC

## 2022-05-31 NOTE — Progress Notes (Signed)
BP 114/77   Pulse 71   Temp 98.3 F (36.8 C) (Oral)   Ht 5\' 5"  (1.651 m)   Wt 256 lb 4.8 oz (116.3 kg)   SpO2 95%   BMI 42.65 kg/m    Subjective:    Patient ID: Nathan Haws., male    DOB: 05-05-1985, 37 y.o.   MRN: [id number]  HPI: Nathan Hackl. is a 37 y.o. male  Chief Complaint  Patient presents with   Cough    All since Friday, has not taken covid test at home.    Vomiting   Diarrhea   UPPER RESPIRATORY TRACT INFECTION Since Friday has had a cough, vomiting, and diarrhea.  Has not been vaccinated for Covid and no testing done at home.  Has not been around anyone that has been sick and no big events.  Has not had emesis since Saturday, no further nausea -- appetite improving.   Fever: no Cough: yes Shortness of breath: no Wheezing: no Chest pain: no Chest tightness: yes Chest congestion: yes Nasal congestion: no Runny nose: no Post nasal drip: no Sneezing: no Sore throat: no Swollen glands: no Sinus pressure: no Headache: no Face pain: no Toothache: no Ear pain: none Ear pressure: none Eyes red/itching:no Eye drainage/crusting: no  Vomiting: yes Rash: no Fatigue: yes Sick contacts: no Strep contacts: no  Context: stable Recurrent sinusitis: no Relief with OTC cold/cough medications: slightly, the DM kind Treatments attempted: cough syrup    Relevant past medical, surgical, family and social history reviewed and updated as indicated. Interim medical history since our last visit reviewed. Allergies and medications reviewed and updated.  Review of Systems  Constitutional:  Positive for fatigue. Negative for activity change, appetite change, chills, diaphoresis and fever.  HENT: Negative.    Respiratory:  Positive for cough. Negative for chest tightness, shortness of breath and wheezing.   Cardiovascular:  Negative for chest pain, palpitations and leg swelling.  Gastrointestinal:  Positive for nausea and vomiting. Negative for abdominal  distention, abdominal pain, anal bleeding, constipation and diarrhea.  Neurological: Negative.   Psychiatric/Behavioral: Negative.     Per HPI unless specifically indicated above     Objective:    BP 114/77   Pulse 71   Temp 98.3 F (36.8 C) (Oral)   Ht 5\' 5"  (1.651 m)   Wt 256 lb 4.8 oz (116.3 kg)   SpO2 95%   BMI 42.65 kg/m   Wt Readings from Last 3 Encounters:  05/31/22 256 lb 4.8 oz (116.3 kg)  04/08/22 253 lb (114.8 kg)  03/17/22 258 lb 4.8 oz (117.2 kg)    Physical Exam Vitals and nursing note reviewed.  Constitutional:      General: He is awake. He is not in acute distress.    Appearance: He is well-developed and well-groomed. He is obese. He is not ill-appearing or toxic-appearing.  HENT:     Head: Normocephalic.     Right Ear: Hearing, ear canal and external ear normal. No tenderness. No middle ear effusion. Tympanic membrane is not injected or perforated.     Left Ear: Hearing, ear canal and external ear normal. No tenderness.  No middle ear effusion. Tympanic membrane is not injected or perforated.     Nose: No rhinorrhea.     Right Sinus: No maxillary sinus tenderness or frontal sinus tenderness.     Left Sinus: No maxillary sinus tenderness or frontal sinus tenderness.     Mouth/Throat:     Mouth:  Mucous membranes are moist.     Pharynx: Posterior oropharyngeal erythema (mild) present. No pharyngeal swelling or oropharyngeal exudate.     Tonsils: 3+ on the right. 3+ on the left.     Comments: Tonsil stone noted to left side. Eyes:     General: Lids are normal.     Extraocular Movements: Extraocular movements intact.     Conjunctiva/sclera: Conjunctivae normal.  Neck:     Thyroid: No thyromegaly.     Vascular: No carotid bruit.  Cardiovascular:     Rate and Rhythm: Normal rate and regular rhythm.     Heart sounds: Normal heart sounds.  Pulmonary:     Effort: No accessory muscle usage or respiratory distress.     Breath sounds: Normal breath sounds.   Abdominal:     General: Bowel sounds are normal. There is no distension.     Palpations: Abdomen is soft.     Tenderness: There is no abdominal tenderness.  Musculoskeletal:     Cervical back: Full passive range of motion without pain.     Right lower leg: No edema.     Left lower leg: No edema.  Lymphadenopathy:     Cervical: No cervical adenopathy.  Skin:    General: Skin is warm.     Capillary Refill: Capillary refill takes less than 2 seconds.  Neurological:     Mental Status: He is alert and oriented to person, place, and time.     Deep Tendon Reflexes: Reflexes are normal and symmetric.     Reflex Scores:      Brachioradialis reflexes are 2+ on the right side and 2+ on the left side.      Patellar reflexes are 2+ on the right side and 2+ on the left side. Psychiatric:        Attention and Perception: Attention normal.        Mood and Affect: Mood normal.        Speech: Speech normal.        Behavior: Behavior normal. Behavior is cooperative.        Thought Content: Thought content normal.    Results for orders placed or performed in visit on 04/08/22  Respiratory Panel w/ SARS-CoV2  Result Value Ref Range   Adenovirus CANCELED   Specimen status report  Result Value Ref Range   specimen status report Comment   Specimen status report  Result Value Ref Range   specimen status report Comment       Assessment & Plan:   Problem List Items Addressed This Visit       Other   Acute cough - Primary    Acute for 4 days.  Flu testing negative.  Covid testing obtained.  Do recommend ENT visit in future once insurance due to enlarged tonsils at baseline and stones intermittent -- removal may help with OSA.  At this time Prednisone 40 MG daily for 5 days and recommend OTC Coricidin as needed.  If not feeling better in 4 days (placing at day 8 of symptoms) then will send in abx therapy for patient, discussed with him.  Recommend: - Increased rest - Increasing Fluids -  Acetaminophen as needed for fever/pain.  - Salt water gargling, chloraseptic spray and throat lozenges - Humidifying the air.       Relevant Orders   Novel Coronavirus, NAA ([facility])   Veritor Flu A/B Waived     Follow up plan: Return if symptoms worsen or fail to improve.

## 2022-05-31 NOTE — Telephone Encounter (Signed)
Reason for Disposition  [1] Continuous (nonstop) coughing interferes with work or school AND [2] no improvement using cough treatment per Care Advice  Answer Assessment - Initial Assessment Questions 1. ONSET: "When did the cough begin?"      I'm coughing up clear/yellow mucus    Friday evening it started.   No fever Diarrhea and vomiting.   Fri. And Sat.  Diarrhea.   Vomiting Sat. Only.    Now just a persistent cough. 2. SEVERITY: "How bad is the cough today?"      I can hear the mucus in my throat and drainage down the back of my throat making me cough a lot. 3. SPUTUM: "Describe the color of your sputum" (none, dry cough; clear, white, yellow, green)     Clear/yellow 4. HEMOPTYSIS: "Are you coughing up any blood?" If so ask: "How much?" (flecks, streaks, tablespoons, etc.)     Not asked 5. DIFFICULTY BREATHING: "Are you having difficulty breathing?" If Yes, ask: "How bad is it?" (e.g., mild, moderate, severe)    - MILD: No SOB at rest, mild SOB with walking, speaks normally in sentences, can lie down, no retractions, pulse < 100.    - MODERATE: SOB at rest, SOB with minimal exertion and prefers to sit, cannot lie down flat, speaks in phrases, mild retractions, audible wheezing, pulse 100-120.    - SEVERE: Very SOB at rest, speaks in single words, struggling to breathe, sitting hunched forward, retractions, pulse > 120      No wheezing but feels like he may have congestion in his chest 6. FEVER: "Do you have a fever?" If Yes, ask: "What is your temperature, how was it measured, and when did it start?"     No 7. CARDIAC HISTORY: "Do you have any history of heart disease?" (e.g., heart attack, congestive heart failure)      Not asked 8. LUNG HISTORY: "Do you have any history of lung disease?"  (e.g., pulmonary embolus, asthma, emphysema)     No lung issues. 9. PE RISK FACTORS: "Do you have a history of blood clots?" (or: recent major surgery, recent prolonged travel, bedridden)     Not  asked 10. OTHER SYMPTOMS: "Do you have any other symptoms?" (e.g., runny nose, wheezing, chest pain)       Did have diarrhea and vomiting but these have resolved.   Nasal congestion with post nasal drip and coughing a lot that is productive. 11. PREGNANCY: "Is there any chance you are pregnant?" "When was your last menstrual period?"       N/A 12. TRAVEL: "Have you traveled out of the country in the last month?" (e.g., travel history, exposures)       N/A  Protocols used: Cough - Acute Productive-A-AH

## 2022-05-31 NOTE — Assessment & Plan Note (Signed)
Acute for 4 days.  Flu testing negative.  Covid testing obtained.  Do recommend ENT visit in future once insurance due to enlarged tonsils at baseline and stones intermittent -- removal may help with OSA.  At this time Prednisone 40 MG daily for 5 days and recommend OTC Coricidin as needed.  If not feeling better in 4 days (placing at day 8 of symptoms) then will send in abx therapy for patient, discussed with him.  Recommend: - Increased rest - Increasing Fluids - Acetaminophen as needed for fever/pain.  - Salt water gargling, chloraseptic spray and throat lozenges - Humidifying the air.

## 2022-05-31 NOTE — Patient Instructions (Signed)
Cough, Adult A cough helps to clear your throat and lungs. It may be a sign of an illness or another condition. A short-term (acute) cough may last 2-3 weeks. A long-term (chronic) cough may last 8 or more weeks. Many things can cause a cough. They include: Illnesses such as: An infection in your throat or lungs. Asthma or other heart or lung problems. Gastroesophageal reflux. This is when acid comes back up from your stomach. Breathing in things that bother (irritate) your lungs. Allergies. Postnasal drip. This is when mucus runs down the back of your throat. Smoking. Some medicines. Follow these instructions at home: Medicines Take over-the-counter and prescription medicines only as told by your doctor. Talk with your doctor before you take cough medicine (cough suppressants). Eating and drinking Do not drink alcohol. Do not drink caffeine. Drink enough fluid to keep your pee (urine) pale yellow. Lifestyle Stay away from cigarette smoke. Do not smoke or use any products that contain nicotine or tobacco. If you need help quitting, ask your doctor. Stay away from things that make you cough. These may include perfume, candles, cleaning products, or campfire smoke. General instructions  Watch for any changes to your cough. Tell your doctor about them. Always cover your mouth when you cough. If the air is dry in your home, use a cool mist vaporizer or humidifier. If your cough is worse at night, try using extra pillows to raise your head up higher while you sleep. Rest as needed. Contact a doctor if: You have new symptoms. Your symptoms get worse. You cough up pus. You have a fever that does not go away. Your cough does not get better after 2-3 weeks. Cough medicine does not help, and you are not sleeping well. You have pain that gets worse or is not helped with medicine. You are losing weight and do not know why. You have night sweats. Get help right away if: You cough up  blood. You have trouble breathing. Your heart is beating very fast. These symptoms may be an emergency. Get help right away. Call 911. Do not wait to see if the symptoms will go away. Do not drive yourself to the hospital. This information is not intended to replace advice given to you by your health care provider. Make sure you discuss any questions you have with your health care provider. Document Revised: 10/16/2021 Document Reviewed: 10/16/2021 Elsevier Patient Education  2023 Elsevier Inc.  

## 2022-05-31 NOTE — Telephone Encounter (Signed)
  Chief Complaint: Productive cough not getting better Symptoms: nasal congestion and post nasal drip causing him to cough a lot. Frequency: Since Friday evening Pertinent Negatives: Patient denies fever or having the diarrhea or vomiting at this point. Disposition: [] ED /[] Urgent Care (no appt availability in office) / [x] Appointment(In office/virtual)/ []  [facility]/ [] Home Care/ [] Refused Recommended Disposition /[] [facility]/ []  Follow-up with PCP Additional Notes: Appt. Made for today at 2:00 with Marnee Guarneri, NP.

## 2022-06-02 LAB — NOVEL CORONAVIRUS, NAA: SARS-CoV-2, NAA: NOT DETECTED

## 2022-06-02 NOTE — Progress Notes (Signed)
Contacted via MyChart   Covid is negative!!!  Great news!!

## 2022-06-06 ENCOUNTER — Encounter: Payer: Self-pay | Admitting: Nurse Practitioner

## 2022-06-07 MED ORDER — AZITHROMYCIN 250 MG PO TABS: ORAL_TABLET | ORAL | 0 refills | Status: AC

## 2022-06-28 ENCOUNTER — Telehealth: Payer: Self-pay

## 2022-06-28 NOTE — Transitions of Care (Post Inpatient/ED Visit) (Signed)
   06/28/2022  Name: [name]. MRN: [id number] DOB: 1985/10/11  Today's TOC FU Call Status: Today's TOC FU Call Status:: Successful TOC FU Call Competed TOC FU Call Complete Date: 06/28/22  Transition Care Management Follow-up Telephone Call Date of Discharge: 06/27/22 Discharge Facility: Other [facility]) Name of Other (Non-[facility]) Discharge Facility: [facility] Type of Discharge: Emergency Department Reason for ED Visit: Other: How have you been since you were released from the hospital?: Same Any questions or concerns?: No  Items Reviewed: Did you receive and understand the discharge instructions provided?: Yes Medications obtained and verified?: Yes (Medications Reviewed) Any new allergies since your discharge?: Yes Dietary orders reviewed?: No Do you have support at home?: No  Home Care and Equipment/Supplies: Were Home Health Services Ordered?: No Any new equipment or medical supplies ordered?: No  Functional Questionnaire: Do you need assistance with bathing/showering or dressing?: No Do you need assistance with meal preparation?: No Do you need assistance with eating?: No Do you have difficulty maintaining continence: No Do you need assistance with getting out of bed/getting out of a chair/moving?: No Do you have difficulty managing or taking your medications?: No  Follow up appointments reviewed: PCP Follow-up appointment confirmed?: No Specialist Hospital Follow-up appointment confirmed?: No Do you need transportation to your follow-up appointment?: No Do you understand care options if your condition(s) worsen?: Yes-patient verbalized understanding    SIGNATURE: Leward Quan, [place] [facility]

## 2022-06-29 ENCOUNTER — Ambulatory Visit (INDEPENDENT_AMBULATORY_CARE_PROVIDER_SITE_OTHER): Payer: Self-pay | Admitting: Surgery

## 2022-06-29 ENCOUNTER — Encounter: Payer: Self-pay | Admitting: Surgery

## 2022-06-29 VITALS — BP 129/85 | HR 58 | Temp 98.0°F | Ht 65.0 in | Wt 255.0 lb

## 2022-06-29 DIAGNOSIS — L03319 Cellulitis of trunk, unspecified: Secondary | ICD-10-CM | POA: Insufficient documentation

## 2022-06-29 DIAGNOSIS — L929 Granulomatous disorder of the skin and subcutaneous tissue, unspecified: Secondary | ICD-10-CM

## 2022-06-29 DIAGNOSIS — L02219 Cutaneous abscess of trunk, unspecified: Secondary | ICD-10-CM

## 2022-06-29 NOTE — Progress Notes (Signed)
Patient ID: Nathan Tarnow., male   DOB: 12/07/1985, 37 y.o.   MRN: [id number]  Chief Complaint: Wound opened in bath.  History of Present Illness Nathan Cornia. is a 37 y.o. male with what was perceived to be a wound dehiscence at an outside facility, is actually a small dermal/subcutaneous boil or abscess at his belt line.  He had about 10 days of discomfort, struggling with his belt line and had changed his clothing to elastic bandage loosefitting to help with this.  He was seen at [facility] ER, a bedside ultrasound confirmed the absence of a abscess in the area.  He is prescribed Bactrim.  Cover dressings are all he is utilizing at present.  Past Medical History Past Medical History:  Diagnosis Date   ADHD (attention deficit hyperactivity disorder)    Anxiety    Bipolar 1 disorder (HCC)    Bipolar 1 disorder (HCC) 08/19/2014   Chronic back pain    Depression    GERD (gastroesophageal reflux disease)    Gout    Hyperlipidemia    Hypertension    Sleep apnea       Past Surgical History:  Procedure Laterality Date   APPENDECTOMY     INSERTION OF MESH Left 12/17/2020   Procedure: INSERTION OF MESH;  Surgeon: Campbell Lerner, MD;  Location: [facility] ORS;  Service: General;  Laterality: Left;   KIDNEY STONE SURGERY      Allergies  Allergen Reactions   Penicillin G Benzathine     Unknown reaction    Amoxicillin Rash    Current Outpatient Medications  Medication Sig Dispense Refill   allopurinol (ZYLOPRIM) 300 MG tablet TAKE 1 TABLET BY MOUTH EVERY DAY 90 tablet 4   aspirin-acetaminophen-caffeine (EXCEDRIN MIGRAINE) 250-250-65 MG tablet Take 2 tablets by mouth every 6 (six) hours as needed for headache.     clonazePAM (KLONOPIN) 0.5 MG tablet TAKE 1 TABLET BY MOUTH TWICE A DAY AS NEEDED FOR ANXIETY 30 tablet 2   esomeprazole (NEXIUM) 40 MG capsule Take 1 capsule (40 mg total) by mouth daily. 90 capsule 4   hydrOXYzine (VISTARIL) 50 MG capsule Take 1 capsule (50 mg total) by mouth 3  (three) times daily. 270 capsule 4   lisinopril (ZESTRIL) 40 MG tablet Take 1 tablet (40 mg total) by mouth daily. 90 tablet 4   metoprolol succinate (TOPROL-XL) 25 MG 24 hr tablet Take 1 tablet (25 mg total) by mouth daily. 90 tablet 4   sulfamethoxazole-trimethoprim (BACTRIM DS) 800-160 MG tablet Take 1 tablet by mouth 2 (two) times daily.     No current facility-administered medications for this visit.    Family History Family History  Problem Relation Age of Onset   Hyperlipidemia Father    Breast cancer Maternal Grandmother    Cancer Paternal Grandmother        pancreatic      Social History Social History   Tobacco Use   Smoking status: Never    Passive exposure: Never   Smokeless tobacco: Current    Types: Chew  Vaping Use   Vaping Use: Never used  Substance Use Topics   Alcohol use: Not Currently    Alcohol/week: 1.0 - 2.0 standard drink of alcohol    Types: 1 - 2 Glasses of wine per week   Drug use: No        Physical Exam Blood pressure 129/85, pulse (!) 58, temperature 98 F (36.7 C), height 5\' 5"  (1.651 m), weight 255 lb (115.7  kg), SpO2 98 %. Last Weight  Most recent update: 06/29/2022  9:23 AM    Weight  115.7 kg (255 lb)             CONSTITUTIONAL: Well developed, and nourished, appropriately responsive and aware without distress.   EYES: Sclera non-icteric.   EARS, NOSE, MOUTH AND THROAT:  The oropharynx is clear. Oral mucosa is pink and moist.   Hearing is intact to voice.  NECK: Trachea is midline, and there is no jugular venous distension.  LYMPH NODES:  Lymph nodes in the neck are not appreciated. RESPIRATORY:  Normal respiratory effort without pathologic use of accessory muscles. CARDIOVASCULAR: Well perfused.  GI: The abdomen is well rounded, soft, nontender, and nondistended. There were no palpable masses.   The scars from his robotic inguinal hernia repair are well-healed and located in the upper abdomen.  He thought this midline spot  on his belt line in the suprapubic area was from a previous surgery, and possibly from the laparoscopic appendectomy.  It is open, not actively draining.  There is appears to be beefy bright red soft tissue just under the skin.  I utilized a sterile Q-tip to carefully probed the wound it was just less than a centimeter deep, without any evidence of tunneling or tracking elsewhere.  Granulation blood coloration of the Q-tip head encourages me that he has good healing tissue going on there.  Further explored it with a silver nitrate stick which she tolerated well, confirming no additional tunneling or tracking or disease process deeper to this point.  He cannot appreciate this area due to his abdominal girth.  MUSCULOSKELETAL:  Symmetrical muscle tone appreciated in all four extremities.    SKIN: Skin turgor is normal. No pathologic skin lesions appreciated.  NEUROLOGIC:  Motor and sensation appear grossly normal.  Cranial nerves are grossly without defect. PSYCH:  Alert and oriented to person, place and time. Affect is appropriate for situation.  Data Reviewed I have personally reviewed what is currently available of the patient's imaging, recent labs and medical records.   Labs:     Latest Ref Rng & Units 09/21/2021    3:36 PM 07/03/2021   10:50 AM 12/26/2020    2:09 PM  CBC  WBC 3.4 - 10.8 x10E3/uL 8.6  9.9  6.4   Hemoglobin 13.0 - 17.7 g/dL 16.1  09.6  04.5   Hematocrit 37.5 - 51.0 % 43.9  48.5  47.4   Platelets 150 - 450 x10E3/uL 220  218  199       Latest Ref Rng & Units 09/21/2021    3:36 PM 07/03/2021   10:50 AM 12/26/2020    2:09 PM  CMP  Glucose 70 - 99 mg/dL 409  84  811   BUN 6 - 20 mg/dL 10  13  9    Creatinine 0.76 - 1.27 mg/dL 9.14  7.82  9.56   Sodium 134 - 144 mmol/L 145  138  139   Potassium 3.5 - 5.2 mmol/L 4.4  4.5  4.5   Chloride 96 - 106 mmol/L 105  100  101   CO2 20 - 29 mmol/L 22  23  20    Calcium 8.7 - 10.2 mg/dL 9.8  21.3  9.6   Total Protein 6.0 - 8.5 g/dL 7.6   7.5  7.2   Total Bilirubin 0.0 - 1.2 mg/dL 0.4  0.6  0.4   Alkaline Phos 44 - 121 IU/L 79  82  94   AST  0 - 40 IU/L 40  38  94   ALT 0 - 44 IU/L 50  47  138    I reviewed the report from the [facility] emergency department.   Imaging: Radiological images reviewed:   Within last 24 hrs: No results found.  Assessment    Suspected ingrown hair/subcutaneous abscess, spontaneously drained in the suprapubic area. Patient Active Problem List   Diagnosis Date Noted   Acute cough 05/31/2022   Tonsillolith 03/17/2022   Arthralgia 10/20/2021   Vitamin D deficiency 06/21/2021   History of left inguinal hernia repair 10/29/2020   Hydrocele in adult 10/29/2020   OSA (obstructive sleep apnea) 09/14/2020   Obesity 07/11/2019   EtOH dependence (HCC) 03/21/2015   Hypertension 03/21/2015   Mixed hyperlipidemia 08/19/2014   Chronic back pain 08/19/2014   ADHD (attention deficit hyperactivity disorder) 08/19/2014   GERD (gastroesophageal reflux disease) 08/19/2014   Gout 08/19/2014   Anxiety and depression 09/12/2012    Plan    I discussed cleansing this wound with hydrogen peroxide moistened Q-tip on a daily basis to ensure adequate healing from the inside out.  He is to finish his course of Bactrim.  We can reevaluate his wound in a couple weeks or as needed.  He may be fully healed by that time.  Face-to-face time spent with the patient and accompanying care providers(if present) was 30 minutes, with more than 50% of the time spent counseling, educating, and coordinating care of the patient.    These notes generated with voice recognition software. I apologize for typographical errors.  Campbell Lerner M.D., FACS 06/29/2022, 11:37 AM

## 2022-06-29 NOTE — Patient Instructions (Addendum)
Keep a dry gauze over the area until it closes fully. Do not submerge the area, but you may take showers. You may notice some gray/black discharge on the dressing this is from the Silver Nitrate we placed. You may use a Q tip wet with some peroxide daily when you shower and lightly probe the area.   Follow up here in 2-3 weeks  Please call and ask to speak with a nurse if you develop questions or concerns.

## 2022-07-13 ENCOUNTER — Ambulatory Visit: Payer: Self-pay | Admitting: Surgery

## 2022-07-19 ENCOUNTER — Other Ambulatory Visit: Payer: Self-pay | Admitting: Nurse Practitioner

## 2022-07-20 ENCOUNTER — Other Ambulatory Visit: Payer: Self-pay | Admitting: Nurse Practitioner

## 2022-07-20 NOTE — Telephone Encounter (Signed)
Requested Prescriptions  Pending Prescriptions Disp Refills   allopurinol (ZYLOPRIM) 300 MG tablet [Pharmacy Med Name: ALLOPURINOL 300 MG TAB[*]] 90 tablet 0    Sig: TAKE ONE TABLET BY MOUTH ONE TIME DAILY     Endocrinology:  Gout Agents - allopurinol Passed - 07/19/2022 12:20 AM      Passed - Uric Acid in normal range and within 360 days    Uric Acid  Date Value Ref Range Status  09/21/2021 6.1 3.8 - 8.4 mg/dL Final    Comment:               Therapeutic target for gout patients: <6.0         Passed - Cr in normal range and within 360 days    Creatinine, Ser  Date Value Ref Range Status  09/21/2021 1.11 0.76 - 1.27 mg/dL Final         Passed - Valid encounter within last 12 months    Recent Outpatient Visits           1 month ago Acute cough   [place] [facility] [place], Patterson T, NP   3 months ago Upper respiratory tract infection, unspecified type   [place] [facility] [place], Megan P, DO   4 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, mental health   [place] [facility] [place], [place] T, NP   9 months ago 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)   [place] [facility] [place], Jolene T, NP   10 months ago 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)   [place] [facility] [place], [place] T, NP              Passed - CBC within normal limits and completed in the last 12 months    WBC  Date Value Ref Range Status  09/21/2021 8.6 3.4 - 10.8 x10E3/uL Final  12/12/2020 7.8 4.0 - 10.5 K/uL Final   RBC  Date Value Ref Range Status  09/21/2021 4.72 4.14 - 5.80 x10E6/uL Final  12/12/2020 4.64 4.22 - 5.81 MIL/uL Final   Hemoglobin  Date Value Ref Range Status  09/21/2021 14.9 13.0 - 17.7 g/dL Final   Hematocrit  Date Value Ref Range Status  09/21/2021 43.9 37.5 - 51.0 % Final   MCHC  Date Value Ref Range Status  09/21/2021 33.9 31.5 - 35.7 g/dL Final  81/19/1478 29.5 (H) 30.0  - 36.0 g/dL Final   [facility]  Date Value Ref Range Status  09/21/2021 31.6 26.6 - 33.0 pg Final  12/12/2020 36.0 (H) 26.0 - 34.0 pg Final   MCV  Date Value Ref Range Status  09/21/2021 93 79 - 97 fL Final   No results found for: "PLTCOUNTKUC", "LABPLAT", "POCPLA" RDW  Date Value Ref Range Status  09/21/2021 12.6 11.6 - 15.4 % Final          esomeprazole (NEXIUM) 40 MG capsule [Pharmacy Med Name: ESOMEPRAZOLE DR 40 MG CAP[*]] 90 capsule 0    Sig: TAKE ONE CAPSULE BY MOUTH ONE TIME DAILY     Gastroenterology: Proton Pump Inhibitors 2 Failed - 07/19/2022 12:20 AM      Failed - ALT in normal range and within 360 days    ALT  Date Value Ref Range Status  09/21/2021 50 (H) 0 - 44 IU/L Final         Passed - AST in normal range and within 360 days    AST  Date Value Ref Range Status  09/21/2021  40 0 - 40 IU/L Final         Passed - Valid encounter within last 12 months    Recent Outpatient Visits           1 month ago Acute cough   [place] [facility] [place], [place] T, NP   3 months ago Upper respiratory tract infection, unspecified type   [place] [facility] [place], Megan P, DO   4 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, mental health   [place] [facility] [place], [place] T, NP   9 months ago 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)   [place] [facility] [place], Jolene T, NP   10 months ago 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)   [place] [facility] [place], Jolene T, NP               lisinopril (ZESTRIL) 40 MG tablet [Pharmacy Med Name: LISINOPRIL 40 MG TAB[*]] 90 tablet 0    Sig: TAKE ONE TABLET BY MOUTH ONE TIME DAILY     Cardiovascular:  ACE Inhibitors Failed - 07/19/2022 12:20 AM      Failed - Cr in normal range and within 180 days    Creatinine, Ser  Date Value Ref Range Status  09/21/2021 1.11 0.76 - 1.27 mg/dL Final         Failed - K in normal range and  within 180 days    Potassium  Date Value Ref Range Status  09/21/2021 4.4 3.5 - 5.2 mmol/L Final         Passed - Patient is not pregnant      Passed - Last BP in normal range    BP Readings from Last 1 Encounters:  06/29/22 129/85         Passed - Valid encounter within last 6 months    Recent Outpatient Visits           1 month ago Acute cough   [place] [facility] [place], Corrie Dandy T, NP   3 months ago Upper respiratory tract infection, unspecified type   [place] 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)   [place] [facility] [place], [place] T, NP               metoprolol succinate (TOPROL-XL) 25 MG 24 hr tablet [Pharmacy Med Name: METOPROLOL SUCC ER 25 MG TAB] 90 tablet 0    Sig: TAKE ONE TABLET BY MOUTH ONE TIME DAILY     Cardiovascular:  Beta Blockers Passed - 07/19/2022 12:20 AM      Passed - Last BP in normal range    BP Readings from Last 1 Encounters:  06/29/22 129/85         Passed - Last Heart Rate in normal range    Pulse Readings from Last 1 Encounters:  06/29/22 (!) 58         Passed - Valid encounter within last 6 months    Recent Outpatient Visits           1 month ago Acute cough   [place] [facility] [place], [place] T, NP   3 months ago  Upper respiratory tract infection, unspecified type   [place] [facility] [place], Megan P, DO   4 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, mental health   [place] [facility] [place], Swartz T, NP   9 months ago Alcohol dependence with alcohol-induced sleep disorder [facility])   [place] [facility] [place], Russell T, NP   10 months ago  Alcohol dependence with alcohol-induced sleep disorder [facility])   [place] [facility] Marjie Skiff, NP

## 2022-08-22 ENCOUNTER — Other Ambulatory Visit: Payer: Self-pay | Admitting: Nurse Practitioner

## 2022-08-23 NOTE — Telephone Encounter (Signed)
Requested medication (s) are due for refill today: yes  Requested medication (s) are on the active medication list: yes    Last refill: 05/21/22 #30  2 refills  Future visit scheduled no  Notes to clinic:Not delegated, please review. Thank you.  Requested Prescriptions  Pending Prescriptions Disp Refills   clonazePAM (KLONOPIN) 0.5 MG tablet [Pharmacy Med Name: CLONAZEPAM 0.5 MG TABLET] 30 tablet     Sig: TAKE 1 TABLET BY MOUTH TWICE A DAY AS NEEDED FOR ANXIETY     Not Delegated - Psychiatry: Anxiolytics/Hypnotics 2 Failed - 08/22/2022  7:56 AM      Failed - This refill cannot be delegated      Failed - Urine Drug Screen completed in last 360 days      Passed - Patient is not pregnant      Passed - Valid encounter within last 6 months    Recent Outpatient Visits           2 months ago Acute cough   [place] [facility] [place], Rouse T, NP   4 months ago Upper respiratory tract infection, unspecified type   [place] [facility] [place], Megan P, DO   5 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, mental health   [place] [facility] [place], Orland T, NP   10 months ago 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

## 2022-09-20 ENCOUNTER — Other Ambulatory Visit: Payer: Self-pay | Admitting: Nurse Practitioner

## 2022-09-22 NOTE — Telephone Encounter (Signed)
Requested medication (s) are due for refill today: yes  Requested medication (s) are on the active medication list: yes  Last refill:  08/24/22  Future visit scheduled: no  Notes to clinic:  Unable to refill per protocol, cannot delegate.      Requested Prescriptions  Pending Prescriptions Disp Refills   clonazePAM (KLONOPIN) 0.5 MG tablet [Pharmacy Med Name: CLONAZEPAM 0.5 MG TABLET] 30 tablet 0    Sig: Take 1 tablet (0.5 mg total) by mouth daily as needed for anxiety. Will need visit in office for further refills on this.     Not Delegated - Psychiatry: Anxiolytics/Hypnotics 2 Failed - 09/20/2022  9:17 PM      Failed - This refill cannot be delegated      Failed - Urine Drug Screen completed in last 360 days      Passed - Patient is not pregnant      Passed - Valid encounter within last 6 months    Recent Outpatient Visits           3 months ago Acute cough   [place] [facility] [place], Stuart T, NP   5 months ago Upper respiratory tract infection, unspecified type   [place] [facility] [place], Megan P, DO   6 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, mental health   [place] [facility] [place], Kendrick T, NP   11 months ago 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 [place], Corrie Dandy T, NP   1 year ago Alcohol dependence with alcohol-induced sleep disorder [facility])   [place] [facility] Marjie Skiff, NP

## 2022-09-27 NOTE — Telephone Encounter (Signed)
Pt returned call and schedule appointment with [facility] on 10/15/2022 @ 10:40 am.

## 2022-09-27 NOTE — Telephone Encounter (Signed)
Attempted to reach patient, LVM informing him that in order receive further refills that he will have to have an appointment per policies for controlled substances.  Put in CRM.

## 2022-10-07 ENCOUNTER — Ambulatory Visit (INDEPENDENT_AMBULATORY_CARE_PROVIDER_SITE_OTHER): Payer: Self-pay | Admitting: Nurse Practitioner

## 2022-10-07 ENCOUNTER — Encounter: Payer: Self-pay | Admitting: Nurse Practitioner

## 2022-10-07 VITALS — BP 112/68 | HR 60 | Temp 98.2°F | Wt 258.4 lb

## 2022-10-07 DIAGNOSIS — I10 Essential (primary) hypertension: Secondary | ICD-10-CM

## 2022-10-07 DIAGNOSIS — F32A Depression, unspecified: Secondary | ICD-10-CM

## 2022-10-07 DIAGNOSIS — Z79899 Other long term (current) drug therapy: Secondary | ICD-10-CM | POA: Insufficient documentation

## 2022-10-07 DIAGNOSIS — F10282 Alcohol dependence with alcohol-induced sleep disorder: Secondary | ICD-10-CM

## 2022-10-07 DIAGNOSIS — Z6841 Body Mass Index (BMI) 40.0 and over, adult: Secondary | ICD-10-CM

## 2022-10-07 DIAGNOSIS — L03319 Cellulitis of trunk, unspecified: Secondary | ICD-10-CM

## 2022-10-07 DIAGNOSIS — L02219 Cutaneous abscess of trunk, unspecified: Secondary | ICD-10-CM

## 2022-10-07 DIAGNOSIS — E782 Mixed hyperlipidemia: Secondary | ICD-10-CM

## 2022-10-07 DIAGNOSIS — F419 Anxiety disorder, unspecified: Secondary | ICD-10-CM

## 2022-10-07 MED ORDER — METOPROLOL SUCCINATE ER 25 MG PO TB24: 12.5000 mg | ORAL_TABLET | Freq: Every day | ORAL | 1 refills | Status: DC

## 2022-10-07 NOTE — Patient Instructions (Signed)

## 2022-10-07 NOTE — Assessment & Plan Note (Signed)
Chronic,stable.  BP has been well below goal with cessation of alcohol use.  Will slowly reduce off Metoprolol XL, educated him on how to perform this reduction -- reduce to 12.5 MG daily at this time and then in 2 weeks change to every other day dosing, and continue to reduce dosing every 2 weeks.  Continue Lisinopril for kidney protection with HTN, discussed with patient.  Check BP at home at least 3 mornings a week. Focus on DASH diet recommended. Obtain labs when has insurance on board again.

## 2022-10-07 NOTE — Progress Notes (Signed)
BP 112/68   Pulse 60   Temp 98.2 F (36.8 C) (Oral)   Wt 258 lb 6.4 oz (117.2 kg)   SpO2 98%   BMI 43.00 kg/m    Subjective:    Patient ID: Nathan Re., male    DOB: December 19, 1985, 37 y.o.   MRN: [id number]  HPI: Nathan Laurance. is a 37 y.o. male  Chief Complaint  Patient presents with   Anxiety   Hypertension   Letter for School/Work    Pt states he was seen at the ER 10/05/26 and needs a note saying he is ok to return to work. States he was out 10/05/22 - 10/07/22.    SKIN INFECTION Was in ER on 10/05/26 and treated for cellulitis to abdomen, they mentioned MRSA in ER.  Needs return to work note.  Treated with Doxycycline and Mupirocin -- to continue for 10 days. Duration: days Location: left abdominal area History of trauma in area: no Pain: yes just to touch, improved Quality: no Severity: mild Redness: yes Swelling:  mild, improving Oozing: no Pus: no Fevers: no Nausea/vomiting: no Status: better Treatments attempted:antibiotics and warm compresses   ALCOHOL ABUSE: Went to detox beginning of 2023.  Was on Keppra 500 MG BID and Vistaril 50 MG TID PRN  Was able to come off Keppra.  He continues to have cessation of alcohol, just celebrated one year of sobriety.  Is no longer attending meetings and overall stable.  HYPERTENSION / HYPERLIPIDEMIA Continues on Metoprolol XL 25 MG and Lisinopril 40 MG daily.  Has not been taking Fenofibrate.  Prefers to minimize medications. Satisfied with current treatment? yes Duration of hypertension: chronic BP monitoring frequency: not checking BP range:  BP medication side effects: no Duration of hyperlipidemia: chronic Aspirin: no Recent stressors: no Recurrent headaches: no Visual changes: no Palpitations: no Dyspnea: no Chest pain: no Lower extremity edema: no Dizzy/lightheaded: no   DEPRESSION Takes Klonopin was taking minimally for a period, but now appears to be taking more on a daily basis based on monthly  refills.  He previously took daily.  Reports other medications offered no benefit or had side effects.  Last fill on PDMP was 09/22/22.  Most often takes daily at bedtime.  Continues to take Vistaril TID for anxiety.  Pt is aware of risks of benzo medication use to include increased sedation, respiratory suppression, falls, dependence and cardiovascular events.  Pt would like to continue treatment as benefit determined to outweigh risk.    Mood status: stable Satisfied with current treatment?: yes Symptom severity: mild  Duration of current treatment : chronic Side effects: no Medication compliance: good compliance Psychotherapy/counseling: none Previous psychiatric medications: Klonopin Depressed mood: no Anxious mood: yes  Anhedonia: no Significant weight loss or gain: no Insomnia: yes hard to fall asleep -- Klonopin helps this Fatigue: no Feelings of worthlessness or guilt: no Impaired concentration/indecisiveness: no Suicidal ideations: no Hopelessness: no Crying spells: no    05/31/2022    2:04 PM 04/08/2022    4:39 PM 03/17/2022    4:14 PM 10/20/2021   11:20 AM 09/21/2021    3:09 PM  Depression screen PHQ 2/9  Decreased Interest 1 1 0 1 3  Down, Depressed, Hopeless 1 1 0 3 3  PHQ - 2 Score 2 2 0 4 6  Altered sleeping 0 2 0 3 3  Tired, decreased energy 3 2 0 3 3  Change in appetite 3 2 3 3 3   Feeling bad or  failure about yourself  1 2 3 3 3   Trouble concentrating 0 2 0 1 1  Moving slowly or fidgety/restless 0 2 0 2 2  Suicidal thoughts 0 1 0 2   PHQ-9 Score 9 15 6 21 21   Difficult doing work/chores Somewhat difficult Somewhat difficult Somewhat difficult Very difficult Somewhat difficult      05/31/2022    2:05 PM 04/08/2022    4:39 PM 03/17/2022    4:15 PM 10/20/2021   11:21 AM  GAD 7 : Generalized Anxiety Score  Nervous, Anxious, on Edge 1 1 3 1   Control/stop worrying 1 1 2 1   Worry too much - different things 1 1 2 1   Trouble relaxing 1 1 0 1  Restless 1 1 2 1   Easily  annoyed or irritable 1 1 1 2   Afraid - awful might happen 1 1 3 2   Total GAD 7 Score 7 7 13 9   Anxiety Difficulty Somewhat difficult Somewhat difficult Somewhat difficult Very difficult    Relevant past medical, surgical, family and social history reviewed and updated as indicated. Interim medical history since our last visit reviewed. Allergies and medications reviewed and updated.  Review of Systems  Constitutional:  Negative for activity change, diaphoresis, fatigue and fever.  Respiratory:  Negative for cough, chest tightness, shortness of breath and wheezing.   Cardiovascular:  Negative for chest pain, palpitations and leg swelling.  Gastrointestinal: Negative.   Neurological: Negative.   Psychiatric/Behavioral:  Negative for decreased concentration, self-injury, sleep disturbance and suicidal ideas.     Per HPI unless specifically indicated above     Objective:    BP 112/68   Pulse 60   Temp 98.2 F (36.8 C) (Oral)   Wt 258 lb 6.4 oz (117.2 kg)   SpO2 98%   BMI 43.00 kg/m   Wt Readings from Last 3 Encounters:  10/07/22 258 lb 6.4 oz (117.2 kg)  06/29/22 255 lb (115.7 kg)  05/31/22 256 lb 4.8 oz (116.3 kg)    Physical Exam Vitals and nursing note reviewed.  Constitutional:      General: He is not in acute distress.    Appearance: He is well-developed and well-groomed. He is obese. He is not ill-appearing.  HENT:     Head: Normocephalic and atraumatic.     Right Ear: Hearing normal. No drainage.     Left Ear: Hearing normal. No drainage.  Eyes:     General: Lids are normal. No scleral icterus.       Right eye: No discharge.        Left eye: No discharge.     Conjunctiva/sclera: Conjunctivae normal.     Pupils: Pupils are equal, round, and reactive to light.  Neck:     Thyroid: No thyromegaly.     Vascular: No carotid bruit.  Cardiovascular:     Rate and Rhythm: Normal rate and regular rhythm.     Heart sounds: Normal heart sounds, S1 normal and S2 normal.  No murmur heard.    No gallop.  Pulmonary:     Effort: Pulmonary effort is normal. No accessory muscle usage or respiratory distress.     Breath sounds: Normal breath sounds.  Abdominal:     General: Bowel sounds are normal. There is no distension.     Palpations: Abdomen is soft. There is no hepatomegaly.     Tenderness: There is no abdominal tenderness.  Musculoskeletal:        General: Normal range of motion.  Cervical back: Normal range of motion and neck supple.     Right lower leg: No edema.     Left lower leg: No edema.  Skin:    General: Skin is warm and dry.     Findings: Abscess present.       Neurological:     Mental Status: He is alert and oriented to person, place, and time.  Psychiatric:        Mood and Affect: Mood normal.        Speech: Speech normal.        Behavior: Behavior normal.        Thought Content: Thought content normal.     Results for orders placed or performed in visit on 05/31/22  Novel Coronavirus, NAA ([facility])   Specimen: Nasopharyngeal(NP) swabs in vial transport medium  Result Value Ref Range   SARS-CoV-2, NAA Not Detected Not Detected  Veritor Flu A/B Waived  Result Value Ref Range   Influenza A Negative Negative   Influenza B Negative Negative      Assessment & Plan:   Problem List Items Addressed This Visit       Cardiovascular and Mediastinum   Hypertension    Chronic,stable.  BP has been well below goal with cessation of alcohol use.  Will slowly reduce off Metoprolol XL, educated him on how to perform this reduction -- reduce to 12.5 MG daily at this time and then in 2 weeks change to every other day dosing, and continue to reduce dosing every 2 weeks.  Continue Lisinopril for kidney protection with HTN, discussed with patient.  Check BP at home at least 3 mornings a week. Focus on DASH diet recommended. Obtain labs when has insurance on board again.      Relevant Medications   metoprolol succinate (TOPROL-XL) 25 MG 24 hr  tablet     Other   Anxiety and depression    Chronic, ongoing.  Continues Klonopin for sleep, started by previous PCP.  Recommend minimal use of this.  Pt is aware of risks of benzo medication use to include increased sedation, respiratory suppression, falls, dependence and cardiovascular events.  Pt would like to continue treatment as benefit determined to outweigh risk.  UDS today and contract next visit. Continue to recommend alternate medications and support on alcohol cessation.  May have THC on urine per his report.       Relevant Orders   [id number] 11+Oxyco+Alc+Crt-Bund   Cellulitis and abscess of trunk    Acute and improving.  Continue abx therapy until complete.  Return if ongoing symptoms.      EtOH dependence (HCC) - Primary    Has been sober for one year, praised for this.  Will continue Vistaril at this time as is offering him benefit.  Refills up to date.  Recommend therapy via [employer] and that he attend [employer] meetings and obtain sponsor. Obtain labs when has insurance on board again.      Long-term current use of benzodiazepine    Refer to anxiety plan of care.      Relevant Orders   [id number] 11+Oxyco+Alc+Crt-Bund   Mixed hyperlipidemia    Chronic, ongoing.  Will remain off Fenofibrate at this time for medication minimization.  Continue to support on alcohol cessation journey.  Recommend continued focus on diet and exercise.  Obtain labs when has insurance on board again.      Relevant Medications   metoprolol succinate (TOPROL-XL) 25 MG 24 hr tablet   Obesity  BMI 43.00.  Recommended eating smaller high protein, low fat meals more frequently and exercising 30 mins a day 5 times a week with a goal of 10-15lb weight loss in the next 3 months. Patient voiced their understanding and motivation to adhere to these recommendations.          Follow up plan: Return in about 6 months (around 04/09/2023) for MOOD, HTN/HLD.

## 2022-10-07 NOTE — Assessment & Plan Note (Signed)
Chronic, ongoing.  Continues Klonopin for sleep, started by previous PCP.  Recommend minimal use of this.  Pt is aware of risks of benzo medication use to include increased sedation, respiratory suppression, falls, dependence and cardiovascular events.  Pt would like to continue treatment as benefit determined to outweigh risk.  UDS today and contract next visit. Continue to recommend alternate medications and support on alcohol cessation.  May have THC on urine per his report.

## 2022-10-07 NOTE — Assessment & Plan Note (Signed)
BMI 43.00.  Recommended eating smaller high protein, low fat meals more frequently and exercising 30 mins a day 5 times a week with a goal of 10-15lb weight loss in the next 3 months. Patient voiced their understanding and motivation to adhere to these recommendations.

## 2022-10-07 NOTE — Assessment & Plan Note (Signed)
Acute and improving.  Continue abx therapy until complete.  Return if ongoing symptoms.

## 2022-10-07 NOTE — Assessment & Plan Note (Signed)
Chronic, ongoing.  Will remain off Fenofibrate at this time for medication minimization.  Continue to support on alcohol cessation journey.  Recommend continued focus on diet and exercise.  Obtain labs when has insurance on board again.

## 2022-10-07 NOTE — Assessment & Plan Note (Signed)
Refer to anxiety plan of care. 

## 2022-10-07 NOTE — Assessment & Plan Note (Signed)
Has been sober for one year, praised for this.  Will continue Vistaril at this time as is offering him benefit.  Refills up to date.  Recommend therapy via [employer] and that he attend [employer] meetings and obtain sponsor. Obtain labs when has insurance on board again.

## 2022-10-15 ENCOUNTER — Ambulatory Visit: Payer: Self-pay | Admitting: Nurse Practitioner

## 2022-10-18 ENCOUNTER — Other Ambulatory Visit: Payer: Self-pay | Admitting: Nurse Practitioner

## 2022-10-19 ENCOUNTER — Other Ambulatory Visit: Payer: Self-pay | Admitting: Nurse Practitioner

## 2022-10-19 NOTE — Telephone Encounter (Signed)
Requested Prescriptions  Pending Prescriptions Disp Refills   lisinopril (ZESTRIL) 40 MG tablet [Pharmacy Med Name: LISINOPRIL 40 MG TAB[*]] 90 tablet 0    Sig: TAKE ONE TABLET BY MOUTH ONE TIME DAILY     Cardiovascular:  ACE Inhibitors Failed - 10/18/2022 12:43 AM      Failed - K in normal range and within 180 days    Potassium  Date Value Ref Range Status  09/21/2021 4.4 3.5 - 5.2 mmol/L Final         Passed - Cr in normal range and within 180 days    Creatinine  Date Value Ref Range Status  10/07/2022 42.6 20.0 - 300.0 mg/dL Final   Creatinine, Ser  Date Value Ref Range Status  09/21/2021 1.11 0.76 - 1.27 mg/dL Final         Passed - Patient is not pregnant      Passed - Last BP in normal range    BP Readings from Last 1 Encounters:  10/07/22 112/68         Passed - Valid encounter within last 6 months    Recent Outpatient Visits           1 week ago 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)   [place] [facility] [place], Corrie Dandy T, NP       Future Appointments             In 5 months Cannady, Dorie Rank, NP [place] [facility], [facility]             allopurinol (ZYLOPRIM) 300 MG tablet [Pharmacy Med Name: ALLOPURINOL 300 MG TAB[*]] 90 tablet 0    Sig: TAKE ONE TABLET BY MOUTH ONE TIME DAILY     Endocrinology:  Gout Agents - allopurinol Failed - 10/18/2022 12:43 AM      Failed - Uric Acid in normal range and within 360 days    Uric Acid  Date Value Ref Range Status   09/21/2021 6.1 3.8 - 8.4 mg/dL Final    Comment:               Therapeutic target for gout patients: <6.0         Failed - CBC within normal limits and completed in the last 12 months    WBC  Date Value Ref Range Status  09/21/2021 8.6 3.4 - 10.8 x10E3/uL Final  12/12/2020 7.8 4.0 - 10.5 K/uL Final   RBC  Date Value Ref Range Status  09/21/2021 4.72 4.14 - 5.80 x10E6/uL Final  12/12/2020 4.64 4.22 - 5.81 MIL/uL Final   Hemoglobin  Date Value Ref Range Status  09/21/2021 14.9 13.0 - 17.7 g/dL Final   Hematocrit  Date Value Ref Range Status  09/21/2021 43.9 37.5 - 51.0 % Final   MCHC  Date Value Ref Range Status  09/21/2021 33.9 31.5 - 35.7 g/dL Final  78/29/5621 30.8 (H) 30.0 - 36.0 g/dL Final   [facility]  Date Value Ref Range Status  09/21/2021 31.6 26.6 -  33.0 pg Final  12/12/2020 36.0 (H) 26.0 - 34.0 pg Final   MCV  Date Value Ref Range Status  09/21/2021 93 79 - 97 fL Final   No results found for: "PLTCOUNTKUC", "LABPLAT", "POCPLA" RDW  Date Value Ref Range Status  09/21/2021 12.6 11.6 - 15.4 % Final         Passed - Cr in normal range and within 360 days    Creatinine  Date Value Ref Range Status  10/07/2022 42.6 20.0 - 300.0 mg/dL Final   Creatinine, Ser  Date Value Ref Range Status  09/21/2021 1.11 0.76 - 1.27 mg/dL Final         Passed - Valid encounter within last 12 months    Recent Outpatient Visits           1 week ago 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)   [place] [facility] [place], [place] T, NP   4 months ago Acute cough   [place] [facility] [place], [place] T, NP   6 months ago Upper respiratory tract infection, unspecified type   [place] [facility] [place], Megan P, DO   7 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, mental health   [place] [facility] [place], [place] T, NP   12 months ago 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)   [place] [facility] [place], Corrie Dandy T, NP       Future Appointments             In 5 months Cannady, Dorie Rank, NP [place] [facility]             esomeprazole (NEXIUM) 40 MG capsule [Pharmacy Med Name: ESOMEPRAZOLE DR 40 MG CAP[*]] 90 capsule 0    Sig: TAKE ONE CAPSULE BY MOUTH ONE TIME DAILY     Gastroenterology: Proton Pump Inhibitors 2 Failed - 10/18/2022 12:43 AM      Failed - ALT in normal range and within 360 days    ALT  Date Value Ref Range Status  09/21/2021 50 (H) 0 - 44 IU/L Final         Failed - AST in normal range and within 360 days    AST  Date Value Ref Range Status  09/21/2021 40 0 - 40 IU/L Final         Passed - Valid encounter within last 12 months    Recent Outpatient Visits           1 week ago 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)   [place] [facility] [place], Corrie Dandy T, NP   4 months ago Acute cough    [place] [facility] [place], [place] T, NP   6 months ago Upper respiratory tract infection, unspecified type   [place] [facility] [place], Megan P, DO   7 months ago [employer]   [place] [facility] [place], Lee[place] T, NP   12 months ago 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

## 2022-10-20 NOTE — Telephone Encounter (Signed)
Requested medication (s) are due for refill today - yes  Requested medication (s) are on the active medication list -yes  Future visit scheduled -yes  Last refill: 09/22/22 #30  Notes to clinic: non delegated Rx  Requested Prescriptions  Pending Prescriptions Disp Refills   clonazePAM (KLONOPIN) 0.5 MG tablet [Pharmacy Med Name: CLONAZEPAM 0.5 MG TABLET] 30 tablet 0    Sig: TAKE 1 TABLET BY MOUTH DAILY AS NEEDED FOR ANXIETY. 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)   [place] [facility] [place], Corrie Dandy T, NP   4 months ago Acute cough   [place] [facility] [place], [place] T, NP   6 months ago Upper respiratory tract infection, unspecified type   [place] [facility] [place], Megan P, DO   7 months ago [employer]   [place] [facility] [place], [place] T, NP   1 year ago 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)   [place] [facility] [place], Corrie Dandy T, NP   4 months ago Acute cough   [place] [facility] [place], Corrie Dandy T, NP   6 months ago Upper respiratory tract infection, unspecified type   [place] [facility] [place], Megan P, DO   7 months ago [employer]   [place] [facility] [place], [place] [address] T, NP   1 year ago Alcohol dependence with alcohol-induced sleep disorder [facility])   [place] [facility] [place], Dorie Rank, NP       Future Appointments             In 5 months Cannady, Dorie Rank, NP  [facility], [facility]

## 2022-10-25 ENCOUNTER — Encounter: Payer: Self-pay | Admitting: Nurse Practitioner

## 2022-10-26 MED ORDER — SULFAMETHOXAZOLE-TRIMETHOPRIM 800-160 MG PO TABS: 1.0000 | ORAL_TABLET | Freq: Two times a day (BID) | ORAL | 0 refills | Status: AC

## 2022-11-08 MED ORDER — SULFAMETHOXAZOLE-TRIMETHOPRIM 800-160 MG PO TABS: 1.0000 | ORAL_TABLET | Freq: Two times a day (BID) | ORAL | 0 refills | Status: AC

## 2022-11-08 NOTE — Addendum Note (Signed)
Addended by: Aura Dials T on: 11/08/2022 04:05 PM   Modules accepted: Orders

## 2023-01-17 ENCOUNTER — Other Ambulatory Visit: Payer: Self-pay | Admitting: Nurse Practitioner

## 2023-01-18 NOTE — Telephone Encounter (Signed)
Requested Prescriptions  Pending Prescriptions Disp Refills   allopurinol (ZYLOPRIM) 300 MG tablet [Pharmacy Med Name: ALLOPURINOL 300 MG TAB[*]] 90 tablet 0    Sig: TAKE ONE TABLET BY MOUTH ONE TIME DAILY     Endocrinology:  Gout Agents - allopurinol Failed - 01/17/2023 12:33 AM      Failed - Uric Acid in normal range and within 360 days    Uric Acid  Date Value Ref Range Status  09/21/2021 6.1 3.8 - 8.4 mg/dL Final    Comment:               Therapeutic target for gout patients: <6.0         Failed - CBC within normal limits and completed in the last 12 months    WBC  Date Value Ref Range Status  09/21/2021 8.6 3.4 - 10.8 x10E3/uL Final  12/12/2020 7.8 4.0 - 10.5 K/uL Final   RBC  Date Value Ref Range Status  09/21/2021 4.72 4.14 - 5.80 x10E6/uL Final  12/12/2020 4.64 4.22 - 5.81 MIL/uL Final   Hemoglobin  Date Value Ref Range Status  09/21/2021 14.9 13.0 - 17.7 g/dL Final   Hematocrit  Date Value Ref Range Status  09/21/2021 43.9 37.5 - 51.0 % Final   MCHC  Date Value Ref Range Status  09/21/2021 33.9 31.5 - 35.7 g/dL Final  62/95/2841 32.4 (H) 30.0 - 36.0 g/dL Final   [facility]  Date Value Ref Range Status  09/21/2021 31.6 26.6 - 33.0 pg Final  12/12/2020 36.0 (H) 26.0 - 34.0 pg Final   MCV  Date Value Ref Range Status  09/21/2021 93 79 - 97 fL Final   No results found for: "PLTCOUNTKUC", "LABPLAT", "POCPLA" RDW  Date Value Ref Range Status  09/21/2021 12.6 11.6 - 15.4 % Final         Passed - Cr in normal range and within 360 days    Creatinine  Date Value Ref Range Status  10/07/2022 42.6 20.0 - 300.0 mg/dL Final   Creatinine, Ser  Date Value Ref Range Status  09/21/2021 1.11 0.76 - 1.27 mg/dL Final         Passed - Valid encounter within last 12 months    Recent Outpatient Visits           3 months ago 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)   [place] [facility] [place], Corrie Dandy T, NP   7 months ago Acute  cough   [place] [facility] [place], [place] T, NP   9 months ago Upper respiratory tract infection, unspecified type   [place] [facility] [place], Megan P, DO   10 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, mental health   [place] [facility] [place], [place] T, NP   1 year ago 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)   [place] [facility] [place], Dorie Rank, NP       Future Appointments             In 2 months Cannady, Dorie Rank, NP  [facility]             lisinopril (ZESTRIL) 40 MG tablet [Pharmacy Med Name: LISINOPRIL 40 MG TAB[*]] 90 tablet 0    Sig: TAKE ONE TABLET BY MOUTH ONE TIME DAILY     Cardiovascular:  ACE Inhibitors Failed - 01/17/2023 12:33 AM      Failed - K in normal range and within 180 days  Potassium  Date Value Ref Range Status  09/21/2021 4.4 3.5 - 5.2 mmol/L Final         Failed - Valid encounter within last 6 months    Recent Outpatient Visits           3 months ago 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 [place], [place] T, NP   9 months ago Upper respiratory tract infection, unspecified type   [place] 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)   [place] [facility] [place], Corrie Dandy T, NP       Future Appointments             In 2 months Cannady, Dorie Rank, NP [place] [facility], [facility]            Passed - Cr in normal range and within 180 days    Creatinine  Date Value Ref Range Status  10/07/2022 42.6 20.0 - 300.0 mg/dL Final   Creatinine, Ser  Date Value Ref Range Status  09/21/2021 1.11 0.76 - 1.27  mg/dL Final         Passed - Patient is not pregnant      Passed - Last BP in normal range    BP Readings from Last 1 Encounters:  10/07/22 112/68          esomeprazole (NEXIUM) 40 MG capsule [Pharmacy Med Name: ESOMEPRAZOLE DR 40 MG CAP[*]] 90 capsule 0    Sig: TAKE ONE CAPSULE BY MOUTH ONE TIME DAILY     Gastroenterology: Proton Pump Inhibitors 2 Failed - 01/17/2023 12:33 AM      Failed - ALT in normal range and within 360 days    ALT  Date Value Ref Range Status  09/21/2021 50 (H) 0 - 44 IU/L Final         Failed - AST in normal range and within 360 days    AST  Date Value Ref Range Status  09/21/2021 40 0 - 40 IU/L Final         Passed - Valid encounter within last 12 months    Recent Outpatient Visits           3 months ago 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)   [place] [facility] [place], Corrie Dandy T, NP   7 months ago Acute cough   [place] [facility] [place], Kelly T, NP   9 months ago Upper respiratory tract infection, unspecified type   [place] [facility] [place], Megan P, DO   10 months ago [employer]   [place] [facility] [place], [place] T, NP   1 year ago 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

## 2023-01-20 ENCOUNTER — Other Ambulatory Visit: Payer: Self-pay | Admitting: Nurse Practitioner

## 2023-01-21 NOTE — Telephone Encounter (Signed)
Requested medication (s) are due for refill today: Yes  Requested medication (s) are on the active medication list: Yes  Last refill:  10/21/22 #30, 2RF  Future visit scheduled: Yes  Notes to clinic:  Unable to refill per protocol, cannot delegate.      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)   [place] [facility] [place], Corrie Dandy T, NP   7 months ago Acute cough   [place] [facility] [place], Sequoyah T, NP   9 months ago Upper respiratory tract infection, unspecified type   [place] [facility] [place], Megan P, DO   10 months ago [employer]   [place] [facility] [place], [place] T, NP   1 year ago Alcohol dependence with alcohol-induced sleep disorder [facility])   [place] [facility] [place], Dorie Rank, NP       Future Appointments             In 2 months Cannady, Dorie Rank, NP [place] [facility]            Passed - Urine Drug Screen completed in last 360 days      Passed - Patient is not pregnant

## 2023-04-03 NOTE — Patient Instructions (Incomplete)
 Managing Anxiety, Adult  After being diagnosed with anxiety, you may be relieved to know why you have felt or behaved a certain way. You may also feel overwhelmed about the treatment ahead and what it will mean for your life. With care and support, you can manage your anxiety.  How to manage lifestyle changes  Understanding the difference between stress and anxiety  Although stress can play a role in anxiety, it is not the same as anxiety. Stress is your body's reaction to life changes and events, both good and bad. Stress is often caused by something external, such as a deadline, test, or competition. It normally goes away after the event has ended and will last just a few hours. But, stress can be ongoing and can lead to more than just stress.  Anxiety is caused by something internal, such as imagining a terrible outcome or worrying that something will go wrong that will greatly upset you. Anxiety often does not go away even after the event is over, and it can become a long-term (chronic) worry.  Lowering stress and anxiety    Talk with your health care provider or a counselor to learn more about lowering anxiety and stress. They may suggest tension-reduction techniques, such as:  Music. Spend time creating or listening to music that you enjoy and that inspires you.  Mindfulness-based meditation. Practice being aware of your normal breaths while not trying to control your breathing. It can be done while sitting or walking.  Centering prayer. Focus on a word, phrase, or sacred image that means something to you and brings you peace.  Deep breathing. Expand your stomach and inhale slowly through your nose. Hold your breath for 3-5 seconds. Then breathe out slowly, letting your stomach muscles relax.  Self-talk. Learn to notice and spot thought patterns that lead to anxiety reactions. Change those patterns to thoughts that feel peaceful.  Muscle relaxation. Take time to tense muscles and then relax them.  Choose a  tension-reduction technique that fits your lifestyle and personality. These techniques take time and practice. Set aside 5-15 minutes a day to do them. Specialized therapists can offer counseling and training in these techniques. The training to help with anxiety may be covered by some insurance plans.  Other things you can do to manage stress and anxiety include:  Keeping a stress diary. This can help you learn what triggers your reaction and then learn ways to manage your response.  Thinking about how you react to certain situations. You may not be able to control everything, but you can control your response.  Making time for activities that help you relax and not feeling guilty about spending your time in this way.  Doing visual imagery. This involves imagining or creating mental pictures to help you relax.  Practicing yoga. Through yoga poses, you can lower tension and relax.     Medicines  Medicines for anxiety include:  Antidepressant medicines. These are usually prescribed for long-term daily control.  Anti-anxiety medicines. These may be added in severe cases, especially when panic attacks occur.  When used together, medicines, psychotherapy, and tension-reduction techniques may be the most effective treatment.  Relationships  Relationships can play a big part in helping you recover. Spend more time connecting with trusted friends and family members. Think about going to couples counseling if you have a partner, taking family education classes, or going to family therapy. Therapy can help you and others better understand your anxiety.  How to recognize changes in  your anxiety  Everyone responds differently to treatment for anxiety. Recovery from anxiety happens when symptoms lessen and stop interfering with your daily life at home or work. This may mean that you will start to:  Have better concentration and focus. Worry will interfere less in your daily thinking.  Sleep better.  Be less irritable.  Have  more energy.  Have improved memory.  Try to recognize when your condition is getting worse. Contact your provider if your symptoms interfere with home or work and you feel like your condition is not improving.  Follow these instructions at home:  Activity  Exercise. Adults should:  Exercise for at least 150 minutes each week. The exercise should increase your heart rate and make you sweat (moderate-intensity exercise).  Do strengthening exercises at least twice a week.  Get the right amount and quality of sleep. Most adults need 7-9 hours of sleep each night.  Lifestyle    Eat a healthy diet that includes plenty of vegetables, fruits, whole grains, low-fat dairy products, and lean protein.  Do not eat a lot of foods that are high in fats, added sugars, or salt (sodium).  Make choices that simplify your life.  Do not use any products that contain nicotine or tobacco. These products include cigarettes, chewing tobacco, and vaping devices, such as e-cigarettes. If you need help quitting, ask your provider.  Avoid caffeine, alcohol, and certain over-the-counter cold medicines. These may make you feel worse. Ask your pharmacist which medicines to avoid.  General instructions  Take over-the-counter and prescription medicines only as told by your provider.  Keep all follow-up visits. This is to make sure you are managing your anxiety well or if you need more support.  Where to find support  You can get help and support from:  Self-help groups.  Online and [employer].  A trusted spiritual leader.  Couples counseling.  Family education classes.  Family therapy.  Where to find more information  You may find that joining a support group helps you deal with your anxiety. The following sources can help you find counselors or support groups near you:  Mental Health America: mentalhealthamerica.net  Anxiety and Depression Association of [place] (ADAA): adaa.org  [employer] Mental Illness (NAMI):  nami.org  Contact a health care provider if:  You have a hard time staying focused or finishing tasks.  You spend many hours a day feeling worried about everyday life.  You are very tired because you cannot stop worrying.  You start to have headaches or often feel tense.  You have chronic nausea or diarrhea.  Get help right away if:  Your heart feels like it is racing.  You have shortness of breath.  You have thoughts of hurting yourself or others.  Get help right away if you feel like you may hurt yourself or others, or have thoughts about taking your own life. Go to your nearest emergency room or:  Call 911.  Call the National Suicide Prevention Lifeline at 316-825-8339 or 988. This is open 24 hours a day.  Text the Crisis Text Line at (407)774-9675.  This information is not intended to replace advice given to you by your health care provider. Make sure you discuss any questions you have with your health care provider.  Document Revised: 11/24/2021 Document Reviewed: 06/08/2020  Elsevier Patient Education  2024 [employer].

## 2023-04-08 ENCOUNTER — Ambulatory Visit: Payer: Self-pay | Admitting: Nurse Practitioner

## 2023-04-17 NOTE — Patient Instructions (Incomplete)

## 2023-04-19 ENCOUNTER — Ambulatory Visit: Payer: Self-pay | Admitting: Nurse Practitioner

## 2023-04-19 DIAGNOSIS — E782 Mixed hyperlipidemia: Secondary | ICD-10-CM

## 2023-04-19 DIAGNOSIS — I10 Essential (primary) hypertension: Secondary | ICD-10-CM

## 2023-04-19 DIAGNOSIS — M1A00X0 Idiopathic chronic gout, unspecified site, without tophus (tophi): Secondary | ICD-10-CM

## 2023-04-19 DIAGNOSIS — G4733 Obstructive sleep apnea (adult) (pediatric): Secondary | ICD-10-CM

## 2023-04-19 DIAGNOSIS — F419 Anxiety disorder, unspecified: Secondary | ICD-10-CM

## 2023-04-19 DIAGNOSIS — E559 Vitamin D deficiency, unspecified: Secondary | ICD-10-CM

## 2023-04-19 DIAGNOSIS — F10282 Alcohol dependence with alcohol-induced sleep disorder: Secondary | ICD-10-CM

## 2023-04-19 DIAGNOSIS — E66813 Obesity, class 3: Secondary | ICD-10-CM

## 2023-04-19 DIAGNOSIS — Z0000 Encounter for general adult medical examination without abnormal findings: Secondary | ICD-10-CM

## 2023-04-21 ENCOUNTER — Other Ambulatory Visit: Payer: Self-pay | Admitting: Nurse Practitioner

## 2023-04-22 NOTE — Telephone Encounter (Signed)
Requested medications are due for refill today.  yes  Requested medications are on the active medications list.  yes  Last refill. 01/18/2023 #90 0 rf  Future visit scheduled.   no  Notes to clinic.  Labs are expired.    Requested Prescriptions  Pending Prescriptions Disp Refills   esomeprazole (NEXIUM) 40 MG capsule [Pharmacy Med Name: ESOMEPRAZOLE DR 40 MG CAP] 90 capsule 0    Sig: TAKE ONE CAPSULE BY MOUTH ONE TIME DAILY     Gastroenterology: Proton Pump Inhibitors 2 Failed - 04/22/2023  2:32 PM      Failed - ALT in normal range and within 360 days    ALT  Date Value Ref Range Status  09/21/2021 50 (H) 0 - 44 IU/L Final         Failed - AST in normal range and within 360 days    AST  Date Value Ref Range Status  09/21/2021 40 0 - 40 IU/L Final         Passed - Valid encounter within last 12 months    Recent Outpatient Visits           6 months ago 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)   [place] [facility] P[place], Orangetree T, NP   10 months ago Acute cough   [facility] [place], Unity T, NP   1 year ago Upper respiratory tract infection, unspecified type   [place] [facility] [place], [place] [facility], DO   1 year ago Nurse, mental health   [place] [facility] [place], Remsen T, NP   1 year ago 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)    [facility] [place], Enola T, NP               allopurinol (ZYLOPRIM) 300 MG tablet [Pharmacy Med Name: ALLOPURINOL 300 MG TAB[*]] 90 tablet 0    Sig: TAKE ONE TABLET BY MOUTH ONE TIME DAILY     Endocrinology:  Gout Agents - allopurinol Failed - 04/22/2023  2:32 PM      Failed - Uric Acid in normal range and within 360 days    Uric Acid  Date Value Ref Range Status  09/21/2021 6.1 3.8 - 8.4 mg/dL Final    Comment:               Therapeutic target for gout patients: <6.0         Failed - CBC  within normal limits and completed in the last 12 months    WBC  Date Value Ref Range Status  09/21/2021 8.6 3.4 - 10.8 x10E3/uL Final  12/12/2020 7.8 4.0 - 10.5 K/uL Final   RBC  Date Value Ref Range Status  09/21/2021 4.72 4.14 - 5.80 x10E6/uL Final  12/12/2020 4.64 4.22 - 5.81 MIL/uL Final   Hemoglobin  Date Value Ref Range Status  09/21/2021 14.9 13.0 - 17.7 g/dL Final   Hematocrit  Date Value Ref Range Status  09/21/2021 43.9 37.5 - 51.0 % Final   MCHC  Date Value Ref Range Status  09/21/2021 33.9 31.5 - 35.7 g/dL Final  29/56/2130 86.5 (H) 30.0 - 36.0 g/dL Final   [facility]  Date Value Ref Range Status  09/21/2021 31.6 26.6 - 33.0 pg Final  12/12/2020 36.0 (H) 26.0 - 34.0 pg Final   MCV  Date Value Ref Range Status  09/21/2021 93 79 - 97 fL Final   No results found for: "PLTCOUNTKUC", "LABPLAT", "  POCPLA" RDW  Date Value Ref Range Status  09/21/2021 12.6 11.6 - 15.4 % Final         Passed - Cr in normal range and within 360 days    Creatinine  Date Value Ref Range Status  10/07/2022 42.6 20.0 - 300.0 mg/dL Final   Creatinine, Ser  Date Value Ref Range Status  09/21/2021 1.11 0.76 - 1.27 mg/dL Final         Passed - Valid encounter within last 12 months    Recent Outpatient Visits           6 months ago 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)   [facility] [place], Jolene T, NP               lisinopril (ZESTRIL) 40 MG tablet [Pharmacy Med Name: LISINOPRIL 40 MG TAB[*]] 90  tablet 0    Sig: TAKE ONE TABLET BY MOUTH ONE TIME DAILY     Cardiovascular:  ACE Inhibitors Failed - 04/22/2023  2:32 PM      Failed - Cr in normal range and within 180 days    Creatinine  Date Value Ref Range Status  10/07/2022 42.6 20.0 - 300.0 mg/dL Final   Creatinine, Ser  Date Value Ref Range Status  09/21/2021 1.11 0.76 - 1.27 mg/dL Final         Failed - K in normal range and within 180 days    Potassium  Date Value Ref Range Status  09/21/2021 4.4 3.5 - 5.2 mmol/L Final         Failed - Valid encounter within last 6 months    Recent Outpatient Visits           6 months ago 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)   [place] [facility] [place], Corrie Dandy T, NP   10 months ago Acute cough   [place] [facility] [place], Corrie Dandy T, NP   1 year ago Upper respiratory tract infection, unspecified type   [place] [facility] [place], Megan P, DO   1 year ago Nurse, mental health   [place] [facility] [place], Holyrood T, NP   1 year ago Alcohol dependence with alcohol-induced sleep disorder [facility])   [place] [facility] [place], Corrie Dandy T, NP              Passed - Patient is not pregnant      Passed - Last BP in normal range    BP Readings from Last 1 Encounters:  10/07/22 112/68         Signed Prescriptions Disp Refills   hydrOXYzine (VISTARIL) 50 MG capsule 270 capsule 4    Sig: TAKE ONE CAPSULE BY MOUTH THREE TIMES A DAY     Ear, Nose, and Throat:  Antihistamines 2 Passed - 04/22/2023  2:32 PM      Passed - Cr in normal range and within 360 days    Creatinine  Date Value Ref Range Status  10/07/2022 42.6 20.0 - 300.0 mg/dL Final   Creatinine, Ser  Date Value Ref Range Status  09/21/2021 1.11 0.76 - 1.27 mg/dL Final         Passed - Valid encounter within last 12 months    Recent Outpatient Visits           6 months ago Alcohol dependence with alcohol-induced  sleep disorder (HCC)   [place] [facility] [place], Corrie Dandy T, NP   10 months ago Acute cough   [place] [facility] [place], Corrie Dandy T, NP   1 year ago Upper respiratory tract infection, unspecified type   [place] [facility] [place], Megan P, DO   1 year ago Tonsillolith   [facility] [place], Spelter T, NP   1 year ago Alcohol dependence with alcohol-induced sleep disorder [facility])   [place] [facility] Marjie Skiff, NP

## 2023-04-22 NOTE — Telephone Encounter (Signed)
Requested Prescriptions  Pending Prescriptions Disp Refills   esomeprazole (NEXIUM) 40 MG capsule [Pharmacy Med Name: ESOMEPRAZOLE DR 40 MG CAP] 90 capsule 0    Sig: TAKE [name] CAPSULE BY MOUTH [name] TIME DAILY     Gastroenterology: Proton Pump Inhibitors 2 Failed - 04/22/2023  2:32 PM      Failed - ALT in normal range and within 360 days    ALT  Date Value Ref Range Status  09/21/2021 50 (H) 0 - 44 IU/L Final         Failed - AST in normal range and within 360 days    AST  Date Value Ref Range Status  09/21/2021 40 0 - 40 IU/L Final         Passed - Valid encounter within last 12 months    Recent Outpatient Visits           6 months ago 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)   [name], [name], [name]   10 months ago Acute cough   [name], Nathan [place] [name], [name]   1 year ago Upper respiratory tract infection, unspecified type   [name], [name], [name]   1 year ago Nurse, mental health   [name], [place] [name], [name]   1 year ago 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)    [name], [name], [name]               allopurinol (ZYLOPRIM) 300 MG tablet [Pharmacy Med Name: ALLOPURINOL 300 MG TAB[*]] 90 tablet 0    Sig: TAKE [name] TABLET BY MOUTH [name] TIME DAILY     Endocrinology:  Gout Agents - allopurinol Failed - 04/22/2023  2:32 PM      Failed - Uric Acid in normal range and within 360 days    Uric Acid  Date Value Ref Range Status  09/21/2021 6.1 3.8 - 8.4 mg/dL Final    Comment:               Therapeutic target for gout patients: <6.0         Failed - CBC within normal limits and completed in the last 12 months    WBC  Date Value Ref Range Status  09/21/2021 8.6 3.4 - 10.8 x10E3/uL Final  12/12/2020 7.8 4.0 - 10.5 K/uL Final   RBC  Date Value Ref Range Status  09/21/2021 4.72  4.14 - 5.80 x10E6/uL Final  12/12/2020 4.64 4.22 - 5.81 MIL/uL Final   Hemoglobin  Date Value Ref Range Status  09/21/2021 14.9 13.0 - 17.7 g/dL Final   Hematocrit  Date Value Ref Range Status  09/21/2021 43.9 37.5 - 51.0 % Final   MCHC  Date Value Ref Range Status  09/21/2021 33.9 31.5 - 35.7 g/dL Final  91/47/8295 62.1 (H) 30.0 - 36.0 g/dL Final   [facility]  Date Value Ref Range Status  09/21/2021 31.6 26.6 - 33.0 pg Final  12/12/2020 36.0 (H) 26.0 - 34.0 pg Final   MCV  Date Value Ref Range Status  09/21/2021 93 79 - 97 fL Final   No results found for: "PLTCOUNTKUC", "LABPLAT", "POCPLA" RDW  Date Value Ref Range Status  09/21/2021 12.6 11.6 - 15.4 % Final         Passed - Cr in normal range and within 360 days    Creatinine  Date Value Ref Range Status  10/07/2022 42.6 20.0 - 300.0 mg/dL Final   Creatinine, Ser  Date Value Ref Range Status  09/21/2021 1.11 0.76 - 1.27 mg/dL Final         Passed - Valid encounter within last 12 months    Recent Outpatient Visits           6 months ago 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)   [name], [name], [name]   10 months ago Acute cough   [name], [name], [name]   1 year ago Upper respiratory tract infection, unspecified type   [name], [name], [name]   1 year ago Nurse, mental health   [name], [name], [name]   1 year ago 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)   [name], [name], [name]               lisinopril (ZESTRIL) 40 MG tablet [Pharmacy Med Name: LISINOPRIL 40 MG TAB[*]] 90 tablet 0    Sig: TAKE [name] TABLET BY MOUTH [name] TIME DAILY     Cardiovascular:  ACE Inhibitors Failed - 04/22/2023  2:32 PM      Failed - Cr in normal range and within 180 days    Creatinine  Date Value Ref Range Status  10/07/2022  42.6 20.0 - 300.0 mg/dL Final   Creatinine, Ser  Date Value Ref Range Status  09/21/2021 1.11 0.76 - 1.27 mg/dL Final         Failed - K in normal range and within 180 days    Potassium  Date Value Ref Range Status  09/21/2021 4.4 3.5 - 5.2 mmol/L Final         Failed - Valid encounter within last 6 months    Recent Outpatient Visits           6 months ago 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)   [name], [name], [name]   10 months ago Acute cough   [name], [name], [name]   1 year ago Upper respiratory tract infection, unspecified type   [name], [name], [name]   1 year ago Nurse, mental health   [name], [name], [name]   1 year ago Alcohol dependence with alcohol-induced sleep disorder [name])   [name], [name], [name]              Passed - Patient is not pregnant      Passed - Last BP in normal range    BP Readings from Last 1 Encounters:  10/07/22 112/68          hydrOXYzine (VISTARIL) 50 MG capsule [Pharmacy Med Name: HYDROXYZINE PAM 50 MG CAP] 270 capsule 4    Sig: TAKE [name] CAPSULE BY MOUTH THREE TIMES A DAY     Ear, Nose, and Throat:  Antihistamines 2 Passed - 04/22/2023  2:32 PM      Passed - Cr in normal range and within 360 days    Creatinine  Date Value Ref Range Status  10/07/2022 42.6 20.0 - 300.0 mg/dL Final   Creatinine, Ser  Date Value Ref Range Status  09/21/2021 1.11 0.76 - 1.27 mg/dL Final         Passed - Valid encounter within last 12 months  Recent Outpatient Visits           6 months ago Alcohol dependence with alcohol-induced sleep disorder [name])   [place] [name] [place], [name], [name]   10 months ago Acute cough   [place] [facility] [place], [name], [name]   1 year ago Upper respiratory tract infection,  unspecified type   [place] [facility] [place], [name], [name]   1 year ago Tonsillolith   [place] [facility] [place], [place] [name] [name], [name]   1 year ago Alcohol dependence with alcohol-induced sleep disorder [facility] [name])    [name] Marjie Skiff, [name]

## 2023-05-08 ENCOUNTER — Other Ambulatory Visit: Payer: Self-pay | Admitting: Nurse Practitioner

## 2023-05-10 NOTE — Telephone Encounter (Signed)
 Requested medications are due for refill today.  yes  Requested medications are on the active medications list.  yes  Last refill. 03/22/2022 #30 2 rf  Future visit scheduled.   no  Notes to clinic.  Refill/refusal not delegated.    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)   [place] [facility] [place], Corrie Dandy T, NP   11 months ago Acute cough   [place] [facility] [place], Corrie Dandy T, NP   1 year ago Upper respiratory tract infection, unspecified type   [facility] [place], Megan P, DO   1 year ago Tonsillolith   [place] [facility] [place], [place] T, NP   1 year ago Alcohol dependence with alcohol-induced sleep disorder [facility])   [facility] [place], Corrie Dandy T, NP              Passed - Urine Drug Screen completed in last 360 days      Passed - Patient is not pregnant

## 2023-07-10 ENCOUNTER — Other Ambulatory Visit: Payer: Self-pay

## 2023-07-10 ENCOUNTER — Emergency Department
Admission: EM | Admit: 2023-07-10 | Discharge: 2023-07-10 | Disposition: A | Discharge status: Home / Self Care | Payer: Self-pay

## 2023-07-10 DIAGNOSIS — R739 Hyperglycemia, unspecified: Secondary | ICD-10-CM

## 2023-07-10 DIAGNOSIS — I10 Essential (primary) hypertension: Secondary | ICD-10-CM | POA: Insufficient documentation

## 2023-07-10 DIAGNOSIS — E1165 Type 2 diabetes mellitus with hyperglycemia: Secondary | ICD-10-CM | POA: Insufficient documentation

## 2023-07-10 LAB — URINALYSIS, ROUTINE W REFLEX MICROSCOPIC
Bacteria, UA: NONE SEEN
Bilirubin Urine: NEGATIVE
Glucose, UA: >=500 mg/dL — AB
Hgb urine dipstick: NEGATIVE
Ketones, ur: 5 mg/dL — AB
Leukocytes,Ua: NEGATIVE
Nitrite: NEGATIVE
Protein, ur: NEGATIVE mg/dL
Specific Gravity, Urine: 1.016 (ref 1.005–1.030)
Squamous Epithelial / HPF: 0 /HPF (ref 0–5)
pH: 6 (ref 5.0–8.0)

## 2023-07-10 LAB — CBC
HCT: 44.5 % (ref 39.0–52.0)
Hemoglobin: 15.6 g/dL (ref 13.0–17.0)
MCH: 28 pg (ref 26.0–34.0)
MCHC: 35.1 g/dL (ref 30.0–36.0)
MCV: 79.7 fL — ABNORMAL LOW (ref 80.0–100.0)
Platelets: 221 10*3/uL (ref 150–400)
RBC: 5.58 MIL/uL (ref 4.22–5.81)
RDW: 11.9 % (ref 11.5–15.5)
WBC: 9.1 10*3/uL (ref 4.0–10.5)
nRBC: 0 % (ref 0.0–0.2)

## 2023-07-10 LAB — BASIC METABOLIC PANEL WITH GFR
Anion gap: 13 (ref 5–15)
BUN: 13 mg/dL (ref 6–20)
CO2: 21 mmol/L — ABNORMAL LOW (ref 22–32)
Calcium: 9.5 mg/dL (ref 8.9–10.3)
Chloride: 95 mmol/L — ABNORMAL LOW (ref 98–111)
Creatinine, Ser: 0.92 mg/dL (ref 0.61–1.24)
GFR, Estimated: >60 mL/min (ref >60)
Glucose, Bld: 282 mg/dL — ABNORMAL HIGH (ref 70–99)
Potassium: 4.1 mmol/L (ref 3.5–5.1)
Sodium: 129 mmol/L — ABNORMAL LOW (ref 135–145)

## 2023-07-10 LAB — BLOOD GAS, VENOUS
Acid-Base Excess: 0.7 mmol/L (ref 0.0–2.0)
Bicarbonate: 24.3 mmol/L (ref 20.0–28.0)
O2 Saturation: 96 %
Patient temperature: 37
pCO2, Ven: 35 mmHg — ABNORMAL LOW (ref 44–60)
pH, Ven: 7.45 — ABNORMAL HIGH (ref 7.25–7.43)
pO2, Ven: 67 mmHg — ABNORMAL HIGH (ref 32–45)

## 2023-07-10 LAB — SODIUM, URINE, RANDOM: Sodium, Ur: 45 mmol/L

## 2023-07-10 LAB — CBG MONITORING, ED
Glucose-Capillary: 262 mg/dL — ABNORMAL HIGH (ref 70–99)
Glucose-Capillary: 237 mg/dL — ABNORMAL HIGH (ref 70–99)

## 2023-07-10 LAB — OSMOLALITY: Osmolality: 288 mosm/kg (ref 275–295)

## 2023-07-10 LAB — OSMOLALITY, URINE: Osmolality, Ur: 474 mosm/kg (ref 300–900)

## 2023-07-10 MED ORDER — SODIUM CHLORIDE 0.9 % IV BOLUS
1000.0000 mL | Freq: Once | INTRAVENOUS | Status: AC
  Administered 2023-07-10: 1000 mL via INTRAVENOUS

## 2023-07-10 MED ORDER — METFORMIN HCL 500 MG PO TABS: 500.0000 mg | ORAL_TABLET | Freq: Two times a day (BID) | ORAL | 0 refills | Status: DC

## 2023-07-10 NOTE — ED Triage Notes (Signed)
 Pt to ED for frequent urination (no pain, no burning) since 2 weeks. Asked if history DM, pt states went to friend's house and checked CBG and it was 465. Pt has also been extremely thirsty. No blurry vision. Walking with steady gait. Skin dry.

## 2023-07-10 NOTE — ED Notes (Signed)
 See triage note  Presents with freq urination and increased thirst for about 2 weeks  No wt loss  Denies any burning with urination

## 2023-07-10 NOTE — Discharge Instructions (Signed)
 Your evaluation in the emergency department was concerning for possible underlying diabetes, but your blood sugar is not dangerously high at this time and we are seeing no complications of it.  I have started you on a medication to help control your blood sugars, and we tested your hemoglobin A1c--this will result in about 3 days, and you can follow the result with your primary care provider.  Return to the emergency department with any new or worsening symptoms.

## 2023-07-10 NOTE — ED Notes (Signed)
 Sent dark green tube with labs.

## 2023-07-10 NOTE — ED Provider Notes (Signed)
 [facility] Provider Note    Event Date/Time   First MD Initiated Contact with Patient 07/10/23 1139     (approximate)   History   extreme thirst and Urinary Frequency  Pt to ED for frequent urination (no pain, no burning) since 2 weeks. Asked if history DM, pt states went to friend's house and checked CBG and it was 465. Pt has also been extremely thirsty. No blurry vision. Walking with steady gait. Skin dry.   HPI [name]. is a 38 y.o. male  pmh hypertension, hyperlipidemia, depression, bipolar 1 disorder, anxiety presents for evaluation of thirst and increased urination - Present for about 1 week, patient used a friend's glucometer last night and reportedly had a glucose reading in the 400s multiple times yesterday evening.  Did not admitting this morning and repeat glucose this morning was in the 400s, downtrended to 300s, and then by time of arrival here about 2-3 hours later is in the 200s. -Beyond increased thirst and urination, no other symptoms.  No recent infectious symptoms.  Otherwise feels well.     Physical Exam   Triage Vital Signs: ED Triage Vitals  Encounter Vitals Group     BP 07/10/23 1131 (!) 129/92     Systolic BP Percentile --      Diastolic BP Percentile --      Pulse Rate 07/10/23 1131 97     Resp 07/10/23 1131 20     Temp 07/10/23 1131 98.4 F (36.9 C)     Temp Source 07/10/23 1131 Oral     SpO2 07/10/23 1131 97 %     Weight 07/10/23 1126 240 lb (108.9 kg)     Height 07/10/23 1126 5\' 4"  (1.626 m)     Head Circumference --      Peak Flow --      Pain Score 07/10/23 1126 0     Pain Loc --      Pain Education --      Exclude from Growth Chart --     Most recent vital signs: Vitals:   07/10/23 1131  BP: (!) 129/92  Pulse: 97  Resp: 20  Temp: 98.4 F (36.9 C)  SpO2: 97%     General: Awake, no distress.  CV:  Good peripheral perfusion. RRR, RP 2+ Resp:  Normal effort. CTAB Abd:  No distention. Nontender  to deep palpation throughout    ED Results / Procedures / Treatments   Labs (all labs ordered are listed, but only abnormal results are displayed) Labs Reviewed  BASIC METABOLIC PANEL WITH GFR - Abnormal; Notable for the following components:      Result Value   Sodium 129 (*)    Chloride 95 (*)    CO2 21 (*)    Glucose, Bld 282 (*)    All other components within normal limits  CBC - Abnormal; Notable for the following components:   MCV 79.7 (*)    All other components within normal limits  URINALYSIS, ROUTINE W REFLEX MICROSCOPIC - Abnormal; Notable for the following components:   Color, Urine YELLOW (*)    APPearance CLEAR (*)    Glucose, UA >=500 (*)    Ketones, ur 5 (*)    All other components within normal limits  BLOOD GAS, VENOUS - Abnormal; Notable for the following components:   pH, Ven 7.45 (*)    pCO2, Ven 35 (*)    pO2, Ven 67 (*)    All other  components within normal limits  CBG MONITORING, ED - Abnormal; Notable for the following components:   Glucose-Capillary 262 (*)    All other components within normal limits  CBG MONITORING, ED - Abnormal; Notable for the following components:   Glucose-Capillary 237 (*)    All other components within normal limits  SODIUM, URINE, RANDOM  OSMOLALITY  OSMOLALITY, URINE  HEMOGLOBIN A1C  CBG MONITORING, ED     EKG  N/a   RADIOLOGY N/a    PROCEDURES:  Critical Care performed: No  Procedures   MEDICATIONS ORDERED IN ED: Medications  sodium chloride 0.9 % bolus 1,000 mL (0 mLs Intravenous Stopped 07/10/23 1320)     IMPRESSION / MDM / ASSESSMENT AND PLAN / ED COURSE  I reviewed the triage vital signs and the nursing notes.                              DDX/MDM/AP: Differential diagnosis includes, but is not limited to, new onset diabetes, consider DKA/HHS, consider other electrolyte abnormality.  No evidence of underlying infection.  Plan: - Labs - IV fluid - Reassess  Patient's presentation is  most consistent with acute presentation with potential threat to life or bodily function.  ED course below.  Workup with persistent hyperglycemia although appropriately downtrending.  No evidence of DKA or HHS at this time.  Glucose repeatedly greater than 126 and has been fasting for greater than 8 hours, is a criteria for diabetes.  Hemoglobin A1c unfortunately will not be back for about 3 days.  Will start metformin and plan for outpatient follow-up, can follow-up hemoglobin A1c as outpatient.  ED return precautions in place.  Patient agrees with plan.  Counseled on diabetic diet.  Clinical Course as of 07/10/23 1357  Sun Jul 10, 2023  1152 Bmp w/ glycemia to 282, normal anion gap, bicarb greater than 20--overall not consistent with DKA.  Moderate hyponatremia to 129.  Will give IV fluid. [MM]  1153 Cbc wnl [MM]  1227 Vbg with no acidosis [MM]  1253 Urine, serum osm wnl Urine sodium wnl [MM]  1255 Hyponatremia overall consistent with pseudohyponatremia negative and serum awesome's greater than 288.  Overall likely due to hyperglycemia. [MM]  1341 Glucose-Capillary(!): 237 Rpt glucose downtrending [MM]    Clinical Course User Index [MM] Collis Deaner, MD     FINAL CLINICAL IMPRESSION(S) / ED DIAGNOSES   Final diagnoses:  Hyperglycemia     Rx / DC Orders   ED Discharge Orders          Ordered    metFORMIN (GLUCOPHAGE) 500 MG tablet  2 times daily with meals        07/10/23 1342             Note:  This document was prepared using Dragon voice recognition software and may include unintentional dictation errors.   Collis Deaner, MD 07/10/23 1357

## 2023-07-11 ENCOUNTER — Telehealth: Payer: Self-pay

## 2023-07-11 NOTE — Transitions of Care (Post Inpatient/ED Visit) (Unsigned)
   07/11/2023  Name: [name]. MRN: [id number] DOB: [birth year]  Today's TOC FU Call Status: Today's TOC FU Call Status:: Unsuccessful Call (1st Attempt) Unsuccessful Call (1st Attempt) Date: 07/11/23  Attempted to reach the patient regarding the most recent Inpatient/ED visit.  Follow Up Plan: Additional outreach attempts will be made to reach the patient to complete the Transitions of Care (Post Inpatient/ED visit) call.

## 2023-07-12 ENCOUNTER — Encounter: Payer: Self-pay | Admitting: Nurse Practitioner

## 2023-07-12 ENCOUNTER — Ambulatory Visit (INDEPENDENT_AMBULATORY_CARE_PROVIDER_SITE_OTHER): Payer: Self-pay | Admitting: Nurse Practitioner

## 2023-07-12 VITALS — BP 114/73 | HR 85 | Temp 98.0°F | Ht 65.0 in | Wt 238.6 lb

## 2023-07-12 DIAGNOSIS — E785 Hyperlipidemia, unspecified: Secondary | ICD-10-CM

## 2023-07-12 DIAGNOSIS — F10282 Alcohol dependence with alcohol-induced sleep disorder: Secondary | ICD-10-CM

## 2023-07-12 DIAGNOSIS — I152 Hypertension secondary to endocrine disorders: Secondary | ICD-10-CM

## 2023-07-12 DIAGNOSIS — E1169 Type 2 diabetes mellitus with other specified complication: Secondary | ICD-10-CM

## 2023-07-12 DIAGNOSIS — Z7984 Long term (current) use of oral hypoglycemic drugs: Secondary | ICD-10-CM

## 2023-07-12 DIAGNOSIS — E669 Obesity, unspecified: Secondary | ICD-10-CM | POA: Insufficient documentation

## 2023-07-12 DIAGNOSIS — R7309 Other abnormal glucose: Secondary | ICD-10-CM

## 2023-07-12 DIAGNOSIS — E1159 Type 2 diabetes mellitus with other circulatory complications: Secondary | ICD-10-CM

## 2023-07-12 LAB — MICROALBUMIN, URINE WAIVED
Creatinine, Urine Waived: 100 mg/dL (ref 10–300)
Microalb, Ur Waived: 80 mg/L — ABNORMAL HIGH (ref 0–19)
Microalb/Creat Ratio: <30 mg/g (ref <30)

## 2023-07-12 LAB — HEMOGLOBIN A1C
Hgb A1c MFr Bld: 12.6 % — ABNORMAL HIGH (ref 4.8–5.6)
Mean Plasma Glucose: 315 mg/dL

## 2023-07-12 LAB — BAYER DCA HB A1C WAIVED: HB A1C (BAYER DCA - WAIVED): 11.6 % — ABNORMAL HIGH (ref 4.8–5.6)

## 2023-07-12 MED ORDER — LISINOPRIL 40 MG PO TABS: 40.0000 mg | ORAL_TABLET | Freq: Every day | ORAL | 2 refills | Status: DC

## 2023-07-12 MED ORDER — ALLOPURINOL 300 MG PO TABS: 300.0000 mg | ORAL_TABLET | Freq: Every day | ORAL | 2 refills | Status: AC

## 2023-07-12 MED ORDER — METFORMIN HCL 1000 MG PO TABS: 1000.0000 mg | ORAL_TABLET | Freq: Two times a day (BID) | ORAL | 3 refills | Status: DC

## 2023-07-12 MED ORDER — ESOMEPRAZOLE MAGNESIUM 40 MG PO CPDR: 40.0000 mg | DELAYED_RELEASE_CAPSULE | Freq: Every day | ORAL | 2 refills | Status: AC

## 2023-07-12 NOTE — Assessment & Plan Note (Signed)
 Has been sober for almost 2 years, praised for this.  Will continue Vistaril  at this time as is offering him benefit.  Refills up to date.  Recommend therapy via [employer] and that he attend [employer] meetings and obtain sponsor.

## 2023-07-12 NOTE — Progress Notes (Signed)
 BP 114/73   Pulse 85   Temp 98 F (36.7 C) (Oral)   Ht 5\' 5"  (1.651 m)   Wt 238 lb 9.6 oz (108.2 kg)   SpO2 95%   BMI 39.71 kg/m    Subjective:    Patient ID: [name]., male    DOB: 21-Nov-1985, 38 y.o.   MRN: [id number]  HPI: [name]. is a 38 y.o. male  Chief Complaint  Patient presents with   ER Follow Up   Diabetes   DIABETES Seen in ER on 07/10/23 for elevation in blood sugars that were noted on a friends glucometer. Sugars elevated in ER, A1c is pending. At the time he was having increased thirst and urination, which led to checking his blood sugar -- 416 on a friends glucometer. Father's eldest brother has diabetes. No alcohol intake -- as of July 10th will be two years since having a drink.  Was drinking 6 sodas a day, has stopped this. Started on 500 MG BID Metformin in ER. Hypoglycemic episodes:no Polydipsia/polyuria: yes Visual disturbance: no Chest pain: no Paresthesias: no Glucose Monitoring: yes  Accucheck frequency: recently on friends machine and has bought his own  Fasting glucose: 215 has been best reading  Post prandial:  Evening:  Before meals: Taking Insulin?: no  Long acting insulin:  Short acting insulin: Blood Pressure Monitoring: not checking Retinal Examination: Not up to Date -- having tomorrow Foot Exam: Up to Date Diabetic Education: Not Completed Pneumovax: Not up to Date Influenza: Up to Date Aspirin: no   Relevant past medical, surgical, family and social history reviewed and updated as indicated. Interim medical history since our last visit reviewed. Allergies and medications reviewed and updated.  Review of Systems  Constitutional:  Negative for activity change, diaphoresis, fatigue and fever.  Respiratory:  Negative for cough, chest tightness, shortness of breath and wheezing.   Cardiovascular:  Negative for chest pain, palpitations and leg swelling.  Gastrointestinal: Negative.   Endocrine: Positive for  polydipsia and polyuria. Negative for polyphagia.  Neurological: Negative.   Psychiatric/Behavioral:  Negative for decreased concentration, self-injury, sleep disturbance and suicidal ideas.     Per HPI unless specifically indicated above     Objective:     BP 114/73   Pulse 85   Temp 98 F (36.7 C) (Oral)   Ht 5\' 5"  (1.651 m)   Wt 238 lb 9.6 oz (108.2 kg)   SpO2 95%   BMI 39.71 kg/m   Wt Readings from Last 3 Encounters:  07/12/23 238 lb 9.6 oz (108.2 kg)  07/10/23 240 lb (108.9 kg)  10/07/22 258 lb 6.4 oz (117.2 kg)    Physical Exam Vitals and nursing note reviewed.  Constitutional:      General: He is awake. He is not in acute distress.    Appearance: He is well-developed and well-groomed. He is obese. He is not ill-appearing or toxic-appearing.  HENT:     Head: Normocephalic.     Right Ear: Hearing and external ear normal.     Left Ear: Hearing and external ear normal.  Eyes:     General: Lids are normal.     Extraocular Movements: Extraocular movements intact.     Conjunctiva/sclera: Conjunctivae normal.  Neck:     Thyroid: No thyromegaly.     Vascular: No carotid bruit.  Cardiovascular:     Rate and Rhythm: Normal rate and regular rhythm.     Heart sounds: Normal heart sounds. No murmur  heard.    No gallop.  Pulmonary:     Effort: No accessory muscle usage or respiratory distress.     Breath sounds: Normal breath sounds.  Abdominal:     General: Bowel sounds are normal. There is no distension.     Palpations: Abdomen is soft.     Tenderness: There is no abdominal tenderness.  Musculoskeletal:     Cervical back: Full passive range of motion without pain.     Right lower leg: No edema.     Left lower leg: No edema.  Lymphadenopathy:     Cervical: No cervical adenopathy.  Skin:    General: Skin is warm.     Capillary Refill: Capillary refill takes less than 2 seconds.  Neurological:     Mental Status: He is alert and oriented to person, place, and time.      Deep Tendon Reflexes: Reflexes are normal and symmetric.     Reflex Scores:      Brachioradialis reflexes are 2+ on the right side and 2+ on the left side.      Patellar reflexes are 2+ on the right side and 2+ on the left side. Psychiatric:        Attention and Perception: Attention normal.        Mood and Affect: Mood normal.        Speech: Speech normal.        Behavior: Behavior normal. Behavior is cooperative.        Thought Content: Thought content normal.     Results for orders placed or performed in visit on 07/12/23  Bayer DCA Hb A1c Waived   Collection Time: 07/12/23 10:33 AM  Result Value Ref Range   HB A1C (BAYER DCA - WAIVED) 11.6 (H) 4.8 - 5.6 %  Microalbumin, Urine Waived   Collection Time: 07/12/23 10:33 AM  Result Value Ref Range   Microalb, Ur Waived 80 (H) 0 - 19 mg/L   Creatinine, Urine Waived 100 10 - 300 mg/dL   Microalb/Creat Ratio <30 <30 mg/g      Assessment & Plan:   Problem List Items Addressed This Visit       Cardiovascular and Mediastinum   Hypertension associated with diabetes (HCC)   Chronic, stable.  BP well at goal.  Continue Lisinopril  for kidney protection with HTN and diabetes, discussed with patient.  Check BP at home at least 3 mornings a week. Focus on DASH diet recommended. CMP up to date in ER.  Urine ALB 80 May 2025.      Relevant Medications   metFORMIN (GLUCOPHAGE) 1000 MG tablet   lisinopril  (ZESTRIL ) 40 MG tablet     Endocrine   Type 2 diabetes mellitus with obesity (HCC) - Primary   Diagnosed on 07/10/2023. Educated him today on diabetes. Suspect diet played a big role in elevations of sugar, was drinking 6 sodas a day, now has stopped these.  A1c today 11.6%, urine ALB 80 May 2025.  Sugars trending down at home with Metformin. - Continue Metformin and discussed with him goal is to slowly trend up to 1000 MG BID, he is tolerating it so far.  To check BS BID, has log to use and knows goals. - Provided him print outs on  diet changes. - On ACE for BP and proteinuria, but no statin at present. - Has eye exam tomorrow, he is to tell provider about diabetes, foot exam next visit - Needs PCV20, flu up to date.  Relevant Medications   metFORMIN (GLUCOPHAGE) 1000 MG tablet   lisinopril  (ZESTRIL ) 40 MG tablet   Other Relevant Orders   Bayer DCA Hb A1c Waived (Completed)   Microalbumin, Urine Waived (Completed)   Lipid Panel w/o Chol/HDL Ratio   Hyperlipidemia associated with type 2 diabetes mellitus (HCC)   Chronic, ongoing.  Will remain off Fenofibrate  at this time for medication minimization.  Continue to support on alcohol cessation journey.  Recommend continued focus on diet and exercise.  Lipid panel today and discuss statin at future visits.      Relevant Medications   metFORMIN (GLUCOPHAGE) 1000 MG tablet   lisinopril  (ZESTRIL ) 40 MG tablet     Other   EtOH dependence (HCC)   Has been sober for almost 2 years, praised for this.  Will continue Vistaril  at this time as is offering him benefit.  Refills up to date.  Recommend therapy via [employer] and that he attend AAA meetings and obtain sponsor.         Follow up plan: Return in about 4 weeks (around 08/09/2023) for T2DM -- added Metformin.

## 2023-07-12 NOTE — Assessment & Plan Note (Addendum)
 Diagnosed on 07/10/2023. Educated him today on diabetes. Suspect diet played a big role in elevations of sugar, was drinking 6 sodas a day, now has stopped these.  A1c today 11.6%, urine ALB 80 May 2025.  Sugars trending down at home with Metformin. - Continue Metformin and discussed with him goal is to slowly trend up to 1000 MG BID, he is tolerating it so far.  To check BS BID, has log to use and knows goals. - Provided him print outs on diet changes. - On ACE for BP and proteinuria, but no statin at present. - Has eye exam tomorrow, he is to tell provider about diabetes, foot exam next visit - Needs PCV20, flu up to date.

## 2023-07-12 NOTE — Assessment & Plan Note (Signed)
 Chronic, stable.  BP well at goal.  Continue Lisinopril  for kidney protection with HTN and diabetes, discussed with patient.  Check BP at home at least 3 mornings a week. Focus on DASH diet recommended. CMP up to date in ER.  Urine ALB 80 May 2025.

## 2023-07-12 NOTE — Patient Instructions (Signed)

## 2023-07-12 NOTE — Assessment & Plan Note (Signed)
 Chronic, ongoing.  Will remain off Fenofibrate  at this time for medication minimization.  Continue to support on alcohol cessation journey.  Recommend continued focus on diet and exercise.  Lipid panel today and discuss statin at future visits.

## 2023-07-13 ENCOUNTER — Ambulatory Visit: Payer: Self-pay | Admitting: Nurse Practitioner

## 2023-07-13 LAB — LIPID PANEL W/O CHOL/HDL RATIO
Cholesterol, Total: 301 mg/dL — ABNORMAL HIGH (ref 100–199)
HDL: 13 mg/dL — ABNORMAL LOW (ref >39)
Triglycerides: 1702 mg/dL (ref 0–149)

## 2023-07-13 MED ORDER — FENOFIBRATE 145 MG PO TABS
145.0000 mg | ORAL_TABLET | Freq: Every day | ORAL | 1 refills | Status: DC
Start: 2023-07-13 — End: 2023-10-17

## 2023-07-13 NOTE — Progress Notes (Signed)
 Contacted via MyChart   Good afternoon [name], your lipid panel has returned and your triglycerides are high.  I recommend we restart your fenofibrate  ASAP to help lower these, especially with your new diagnosis of diabetes.  I will send this in.  Any question? Also happy early birthday!! Keep being stellar!!  Thank you for allowing me to participate in your care.  I appreciate you. Kindest regards, Chara Marquard

## 2023-07-15 ENCOUNTER — Inpatient Hospital Stay: Payer: Self-pay | Admitting: Nurse Practitioner

## 2023-07-20 ENCOUNTER — Encounter: Payer: Self-pay | Admitting: Nurse Practitioner

## 2023-07-22 ENCOUNTER — Inpatient Hospital Stay: Payer: Self-pay | Admitting: Nurse Practitioner

## 2023-08-07 NOTE — Patient Instructions (Signed)
Be Involved in Caring For Your Health:  Taking Medications When medications are taken as directed, they can greatly improve your health. But if they are not taken as prescribed, they may not work. In some cases, not taking them correctly can be harmful. To help ensure your treatment remains effective and safe, understand your medications and how to take them. Bring your medications to each visit for review by your provider.  Your lab results, notes, and after visit summary will be available on My Chart. We strongly encourage you to use this feature. If lab results are abnormal the clinic will contact you with the appropriate steps. If the clinic does not contact you assume the results are satisfactory. You can always view your results on My Chart. If you have questions regarding your health or results, please contact the clinic during office hours. You can also ask questions on My Chart.  We at [facility] are grateful that you chose [place] to provide your care. We strive to provide evidence-based and compassionate care and are always looking for feedback. If you get a survey from the clinic please complete this so we can hear your opinions.  Diabetes Mellitus and Exercise Regular exercise is important for your health, especially if you have diabetes mellitus. Exercise is not just about losing weight. It can also help you increase muscle strength and bone density and reduce body fat and stress. This can help your level of endurance and make you more fit and flexible. Why should I exercise if I have diabetes? Exercise has many benefits for people with diabetes. It can: Help lower and control your blood sugar (glucose). Help your body respond better and become more sensitive to the hormone insulin. Reduce how much insulin your body needs. Lower your risk for heart disease by: Lowering how much "bad" cholesterol and triglycerides you have in your body. Increasing how much "good" cholesterol  you have in your body. Lowering your blood pressure. Lowering your blood glucose levels. What is my activity plan? Your health care provider or an expert trained in diabetes care (certified diabetes educator) can help you make an activity plan. This plan can help you find the type of exercise that works for you. It may also tell you how often to exercise and for how long. Be sure to: Get at least 150 minutes of medium-intensity or high-intensity exercise each week. This may involve brisk walking, biking, or water aerobics. Do stretching and strengthening exercises at least 2 times a week. This may involve yoga or weight lifting. Spread out your activity over at least 3 days of the week. Get some form of physical activity each day. Do not go more than 2 days in a row without some kind of activity. Avoid being inactive for more than 30 minutes at a time. Take frequent breaks to walk or stretch. Choose activities that you enjoy. Set goals that you know you can accomplish. Start slowly and increase the intensity of your exercise over time. How do I manage my diabetes during exercise?  Monitor your blood glucose Check your blood glucose before and after you exercise. If your blood glucose is 240 mg/dL (40.9 mmol/L) or higher before you exercise, check your urine for ketones. These are chemicals created by the liver. If you have ketones in your urine, do not exercise until your blood glucose returns to normal. If your blood glucose is 100 mg/dL (5.6 mmol/L) or lower, eat a snack that has 15-20 grams of carbohydrate in  it. Check your blood glucose 15 minutes after the snack to make sure that your level is above 100 mg/dL (5.6 mmol/L) before you start to exercise. Your risk for low blood glucose (hypoglycemia) goes up during and after exercise. Know the symptoms of this condition and how to treat it. Follow these instructions at home: Keep a carbohydrate snack on hand for use before, during, and after  exercise. This can help prevent or treat hypoglycemia. Avoid injecting insulin into parts of your body that are going to be used during exercise. This may include: Your arms, when you are going to play tennis. Your legs, when you are about to go jogging. Keep track of your exercise habits. This can help you and your health care provider watch and adjust your activity plan. Write down: What you eat before and after you exercise. Blood glucose levels before and after you exercise. The type and amount of exercise you do. Talk to your health care provider before you start a new activity. They may need to: Make sure that the activity is safe for you. Adjust your insulin, other medicines, and food that you eat. Drink water while you exercise. This can stop you from losing too much water (dehydration). It can also prevent problems caused by having a lot of heat in your body (heat stroke). Where to find more information American Diabetes Association: diabetes.org Association of Diabetes Care & Education Specialists: diabeteseducator.org This information is not intended to replace advice given to you by your health care provider. Make sure you discuss any questions you have with your health care provider. Document Revised: 08/05/2021 Document Reviewed: 08/05/2021 Elsevier Patient Education  2024 [employer].

## 2023-08-09 ENCOUNTER — Encounter: Payer: Self-pay | Admitting: Nurse Practitioner

## 2023-08-09 ENCOUNTER — Ambulatory Visit (INDEPENDENT_AMBULATORY_CARE_PROVIDER_SITE_OTHER): Payer: Self-pay | Admitting: Nurse Practitioner

## 2023-08-09 VITALS — BP 100/64 | HR 77 | Temp 98.1°F | Ht 65.0 in | Wt 240.4 lb

## 2023-08-09 DIAGNOSIS — E669 Obesity, unspecified: Secondary | ICD-10-CM

## 2023-08-09 DIAGNOSIS — E1169 Type 2 diabetes mellitus with other specified complication: Secondary | ICD-10-CM

## 2023-08-09 DIAGNOSIS — I152 Hypertension secondary to endocrine disorders: Secondary | ICD-10-CM

## 2023-08-09 DIAGNOSIS — E1159 Type 2 diabetes mellitus with other circulatory complications: Secondary | ICD-10-CM

## 2023-08-09 MED ORDER — LISINOPRIL 20 MG PO TABS: 20.0000 mg | ORAL_TABLET | Freq: Every day | ORAL | 3 refills | Status: DC

## 2023-08-09 NOTE — Assessment & Plan Note (Signed)
 Chronic, stable.  BP now on lower side with diet changes and cessation from alcohol use.  Reduce Lisinopril  to 20 MG daily, but maintain on board for kidney protection with HTN and diabetes, discussed with patient.  He will split 40 MG tablets in 1/2 for now as just picked up 90 day supply.  Check BP at home at least 3 mornings a week. Focus on DASH diet recommended. Urine ALB 80 May 2025.

## 2023-08-09 NOTE — Progress Notes (Signed)
 BP 100/64 (BP Location: Left Arm, Patient Position: Sitting)   Pulse 77   Temp 98.1 F (36.7 C) (Oral)   Ht 5\' 5"  (1.651 m)   Wt 240 lb 6.4 oz (109 kg)   SpO2 96%   BMI 40.00 kg/m    Subjective:    Patient ID: [name]., male    DOB: 03/17/1985, 38 y.o.   MRN: [id number]  HPI: [name]. is a 38 y.o. male  Chief Complaint  Patient presents with   Diabetes   DIABETES Follow-up today for diabetes.  Started on Metformin  recently due to A1c 11.6% with symptoms at the time.  Taking 1000 MG twice a day Metformin .  Has cut back on sodas, almost cut them out -- except for occasional diet soda.  Still has some fatigue. Hypoglycemic episodes:no Polydipsia/polyuria: improved Visual disturbance: went to eye doctor recently, but new prescription could not see through them Chest pain: no Paresthesias: no Glucose Monitoring: yes  Accucheck frequency: BID 100 to 150  Fasting glucose:   Post prandial:  Evening:  Before meals: Taking Insulin?: no  Long acting insulin:  Short acting insulin: Blood Pressure Monitoring: not checking Retinal Examination: Up to Date Foot Exam: Up to Date Diabetic Education: Not Completed Pneumovax: Not up to Date Influenza: Up to Date Aspirin: no   Relevant past medical, surgical, family and social history reviewed and updated as indicated. Interim medical history since our last visit reviewed. Allergies and medications reviewed and updated.  Review of Systems  Constitutional:  Positive for fatigue. Negative for activity change, diaphoresis and fever.  Respiratory:  Negative for cough, chest tightness, shortness of breath and wheezing.   Cardiovascular:  Negative for chest pain, palpitations and leg swelling.  Gastrointestinal:  Negative for abdominal distention, abdominal pain, constipation, diarrhea, nausea and vomiting.  Endocrine: Negative for polydipsia, polyphagia and polyuria.  Musculoskeletal: Negative.   Skin: Negative.    Neurological: Negative.   Psychiatric/Behavioral: Negative.      Per HPI unless specifically indicated above     Objective:     BP 100/64 (BP Location: Left Arm, Patient Position: Sitting)   Pulse 77   Temp 98.1 F (36.7 C) (Oral)   Ht 5\' 5"  (1.651 m)   Wt 240 lb 6.4 oz (109 kg)   SpO2 96%   BMI 40.00 kg/m   Wt Readings from Last 3 Encounters:  08/09/23 240 lb 6.4 oz (109 kg)  07/12/23 238 lb 9.6 oz (108.2 kg)  07/10/23 240 lb (108.9 kg)    Physical Exam Vitals and nursing note reviewed.  Constitutional:      General: He is awake. He is not in acute distress.    Appearance: He is well-developed and well-groomed. He is obese. He is not ill-appearing or toxic-appearing.  HENT:     Head: Normocephalic.     Right Ear: Hearing and external ear normal.     Left Ear: Hearing and external ear normal.  Eyes:     General: Lids are normal.     Extraocular Movements: Extraocular movements intact.     Conjunctiva/sclera: Conjunctivae normal.  Neck:     Thyroid: No thyromegaly.     Vascular: No carotid bruit.  Cardiovascular:     Rate and Rhythm: Normal rate and regular rhythm.     Heart sounds: Normal heart sounds. No murmur heard.    No gallop.  Pulmonary:     Effort: No accessory muscle usage or respiratory distress.  Breath sounds: Normal breath sounds.  Abdominal:     General: Bowel sounds are normal. There is no distension.     Palpations: Abdomen is soft.     Tenderness: There is no abdominal tenderness.  Musculoskeletal:     Cervical back: Full passive range of motion without pain.     Right lower leg: No edema.     Left lower leg: No edema.  Lymphadenopathy:     Cervical: No cervical adenopathy.  Skin:    General: Skin is warm.     Capillary Refill: Capillary refill takes less than 2 seconds.  Neurological:     Mental Status: He is alert and oriented to person, place, and time.     Deep Tendon Reflexes: Reflexes are normal and symmetric.     Reflex  Scores:      Brachioradialis reflexes are 2+ on the right side and 2+ on the left side.      Patellar reflexes are 2+ on the right side and 2+ on the left side. Psychiatric:        Attention and Perception: Attention normal.        Mood and Affect: Mood normal.        Speech: Speech normal.        Behavior: Behavior normal. Behavior is cooperative.        Thought Content: Thought content normal.    Diabetic Foot Exam - Simple   Simple Foot Form Visual Inspection No deformities, no ulcerations, no other skin breakdown bilaterally: Yes Sensation Testing Intact to touch and monofilament testing bilaterally: Yes Pulse Check Posterior Tibialis and Dorsalis pulse intact bilaterally: Yes Comments     Results for orders placed or performed in visit on 07/12/23  Bayer DCA Hb A1c Waived   Collection Time: 07/12/23 10:33 AM  Result Value Ref Range   HB A1C (BAYER DCA - WAIVED) 11.6 (H) 4.8 - 5.6 %  Microalbumin, Urine Waived   Collection Time: 07/12/23 10:33 AM  Result Value Ref Range   Microalb, Ur Waived 80 (H) 0 - 19 mg/L   Creatinine, Urine Waived 100 10 - 300 mg/dL   Microalb/Creat Ratio <30 <30 mg/g  Lipid Panel w/o Chol/HDL Ratio   Collection Time: 07/12/23 10:33 AM  Result Value Ref Range   Cholesterol, Total 301 (H) 100 - 199 mg/dL   Triglycerides 0,865 (HH) 0 - 149 mg/dL   HDL 13 (L) >78 mg/dL   VLDL Cholesterol Cal Comment (A) 5 - 40 mg/dL   LDL Chol Calc (NIH) Comment (A) 0 - 99 mg/dL   LDL CALC COMMENT: Comment       Assessment & Plan:   Problem List Items Addressed This Visit       Cardiovascular and Mediastinum   Hypertension associated with diabetes (HCC)   Chronic, stable.  BP now on lower side with diet changes and cessation from alcohol use.  Reduce Lisinopril  to 20 MG daily, but maintain on board for kidney protection with HTN and diabetes, discussed with patient.  He will split 40 MG tablets in 1/2 for now as just picked up 90 day supply.  Check BP at home  at least 3 mornings a week. Focus on DASH diet recommended. Urine ALB 80 May 2025.      Relevant Medications   lisinopril  (ZESTRIL ) 20 MG tablet     Endocrine   Type 2 diabetes mellitus with obesity (HCC) - Primary   Diagnosed on 07/10/2023. Suspect diet played a big role  in elevations of sugar, was drinking 6 sodas a day, now has stopped these.  A1c recent visit was 11.6%, urine ALB 80 May 2025.  Sugars trending down at home with Metformin  currently and within goal range - Continue Metformin  1000 MG BID as is tolerating.  To check BS BID, has log to use and knows goals. - Provided him print outs on diet changes at last visit - On ACE for BP and proteinuria, but no statin at present. - Eye and Foot exams up to date - Needs PCV20, flu up to date.      Relevant Medications   lisinopril  (ZESTRIL ) 20 MG tablet     Follow up plan: Return in about 2 months (around 10/14/2023) for T2DM, HTN/HLD.

## 2023-08-09 NOTE — Assessment & Plan Note (Signed)
 Diagnosed on 07/10/2023. Suspect diet played a big role in elevations of sugar, was drinking 6 sodas a day, now has stopped these.  A1c recent visit was 11.6%, urine ALB 80 May 2025.  Sugars trending down at home with Metformin  currently and within goal range - Continue Metformin  1000 MG BID as is tolerating.  To check BS BID, has log to use and knows goals. - Provided him print outs on diet changes at last visit - On ACE for BP and proteinuria, but no statin at present. - Eye and Foot exams up to date - Needs PCV20, flu up to date.

## 2023-08-14 ENCOUNTER — Other Ambulatory Visit: Payer: Self-pay | Admitting: Nurse Practitioner

## 2023-08-16 NOTE — Telephone Encounter (Signed)
 Requested medications are due for refill today.  yes  Requested medications are on the active medications list.  yes  Last refill. 05/11/2023 #30 2 rf  Future visit scheduled.   yes  Notes to clinic.  Refill not delegated.    Requested Prescriptions  Pending Prescriptions Disp Refills   clonazePAM  (KLONOPIN ) 0.5 MG tablet [Pharmacy Med Name: CLONAZEPAM  0.5 MG TABLET] 30 tablet 2    Sig: Take 1 tablet (0.5 mg total) by mouth daily as needed for anxiety. Will need visit for further refills.     Not Delegated - Psychiatry: Anxiolytics/Hypnotics 2 Failed - 08/16/2023  5:22 PM      Failed - This refill cannot be delegated      Failed - Valid encounter within last 6 months    Recent Outpatient Visits           1 week ago Type 2 diabetes mellitus with obesity (HCC)   [facility] [place], [place] T, NP   1 month ago Type 2 diabetes mellitus with obesity (HCC)   [facility] [place], [place] T, NP              Passed - Urine Drug Screen completed in last 360 days      Passed - Patient is not pregnant

## 2023-08-21 ENCOUNTER — Encounter: Payer: Self-pay | Admitting: Nurse Practitioner

## 2023-08-22 MED ORDER — LOSARTAN POTASSIUM 25 MG PO TABS: 25.0000 mg | ORAL_TABLET | Freq: Every day | ORAL | 2 refills | Status: DC

## 2023-10-17 ENCOUNTER — Ambulatory Visit (INDEPENDENT_AMBULATORY_CARE_PROVIDER_SITE_OTHER): Payer: Self-pay | Admitting: Nurse Practitioner

## 2023-10-17 ENCOUNTER — Encounter: Payer: Self-pay | Admitting: Nurse Practitioner

## 2023-10-17 VITALS — BP 122/75 | HR 62 | Temp 97.6°F | Ht 65.0 in | Wt 228.4 lb

## 2023-10-17 DIAGNOSIS — E785 Hyperlipidemia, unspecified: Secondary | ICD-10-CM

## 2023-10-17 DIAGNOSIS — F10282 Alcohol dependence with alcohol-induced sleep disorder: Secondary | ICD-10-CM

## 2023-10-17 DIAGNOSIS — E1169 Type 2 diabetes mellitus with other specified complication: Secondary | ICD-10-CM

## 2023-10-17 DIAGNOSIS — E1159 Type 2 diabetes mellitus with other circulatory complications: Secondary | ICD-10-CM

## 2023-10-17 DIAGNOSIS — E66813 Obesity, class 3: Secondary | ICD-10-CM

## 2023-10-17 DIAGNOSIS — I152 Hypertension secondary to endocrine disorders: Secondary | ICD-10-CM

## 2023-10-17 DIAGNOSIS — E669 Obesity, unspecified: Secondary | ICD-10-CM

## 2023-10-17 DIAGNOSIS — Z79899 Other long term (current) drug therapy: Secondary | ICD-10-CM

## 2023-10-17 DIAGNOSIS — Z6841 Body Mass Index (BMI) 40.0 and over, adult: Secondary | ICD-10-CM

## 2023-10-17 DIAGNOSIS — F419 Anxiety disorder, unspecified: Secondary | ICD-10-CM

## 2023-10-17 DIAGNOSIS — F32A Depression, unspecified: Secondary | ICD-10-CM

## 2023-10-17 LAB — BAYER DCA HB A1C WAIVED: HB A1C (BAYER DCA - WAIVED): 5.6 % (ref 4.8–5.6)

## 2023-10-17 MED ORDER — FENOFIBRATE 145 MG PO TABS: 145.0000 mg | ORAL_TABLET | Freq: Every day | ORAL | 3 refills | Status: AC

## 2023-10-17 MED ORDER — METFORMIN HCL 1000 MG PO TABS: 1000.0000 mg | ORAL_TABLET | Freq: Two times a day (BID) | ORAL | 3 refills | Status: AC

## 2023-10-17 MED ORDER — CLONAZEPAM 0.5 MG PO TABS: 0.5000 mg | ORAL_TABLET | Freq: Every day | ORAL | 2 refills | Status: DC | PRN

## 2023-10-17 MED ORDER — LOSARTAN POTASSIUM 25 MG PO TABS: 25.0000 mg | ORAL_TABLET | Freq: Every day | ORAL | 3 refills | Status: AC

## 2023-10-17 NOTE — Assessment & Plan Note (Signed)
 Diagnosed on 07/10/2023 with A1c 11.6%. A1c today 5.6%, praised for success.  Suspect diet played a big role in elevations of sugar, was drinking 6 sodas a day, now has stopped these. Urine ALB 80 May 2025.   - Continue Metformin  1000 MG BID as is tolerating.  To check BS BID, has log to use and knows goals. - Provided him print outs on diet changes at last visit - On ARB for BP and proteinuria, but no statin at present. - Eye and Foot exams up to date - Needs PCV20, flu up to date.

## 2023-10-17 NOTE — Assessment & Plan Note (Signed)
 Chronic, stable.  BP at goal in office.  Continue current medication regimen as is tolerating and ARB is kidney protective.  Check BP at home at least 3 mornings a week. Focus on DASH diet recommended. Urine ALB 80 May 2025.

## 2023-10-17 NOTE — Assessment & Plan Note (Signed)
 Chronic, ongoing.  Will remain off Fenofibrate  at this time for medication minimization.  Continue to support on alcohol cessation journey.  Recommend continued focus on diet and exercise.  Lipid panel today and discuss statin at future visits.

## 2023-10-17 NOTE — Assessment & Plan Note (Signed)
 Chronic, ongoing.  Continues Klonopin  for sleep, started by previous PCP.  Recommend minimal use of this and helps prevent him from turning to alcohol when anxious.  Pt is aware of risks of benzo medication use to include increased sedation, respiratory suppression, falls, dependence and cardiovascular events.  Pt would like to continue treatment as benefit determined to outweigh risk.  UDS today and contract next visit. Continue to recommend alternate medications and support on alcohol cessation.

## 2023-10-17 NOTE — Assessment & Plan Note (Signed)
 Refer to anxiety plan of care.

## 2023-10-17 NOTE — Assessment & Plan Note (Signed)
 BMI 38.01. Recommended eating smaller high protein, low fat meals more frequently and exercising 30 mins a day 5 times a week with a goal of 10-15lb weight loss in the next 3 months. Patient voiced their understanding and motivation to adhere to these recommendations.

## 2023-10-17 NOTE — Patient Instructions (Signed)

## 2023-10-17 NOTE — Progress Notes (Addendum)
 BP 122/75   Pulse 62   Temp 97.6 F (36.4 C) (Oral)   Ht 5' 5 (1.651 m)   Wt 228 lb 6.4 oz (103.6 kg)   SpO2 97%   BMI 38.01 kg/m    Subjective:    Patient ID: Nathan JONETTA Arloa Mickey., male    DOB: 07-18-1985, 38 y.o.   MRN: [id number]  HPI: Nathan Offord. is a 38 y.o. male  Chief Complaint  Patient presents with   Diabetes    No recent eye exam per patient   Hyperlipidemia   Hypertension   Anxiety   DIABETES Started on Metformin  in May 2025 due to A1c 11.6%.  He has since been working on diet changes and regular activity.  Continues to avoid sugary drinks. Hypoglycemic episodes:no Polydipsia/polyuria: no Visual disturbance: no Chest pain: no Paresthesias: no Glucose Monitoring: yes  Accucheck frequency: BID  Fasting glucose: 100 to 130  Post prandial:  Evening:  Before meals: Taking Insulin?: no  Long acting insulin:  Short acting insulin: Blood Pressure Monitoring: not checking Retinal Examination: Not up to Date Foot Exam: Up to Date Diabetic Education: Not Completed Pneumovax: Not up to Date Influenza: Not up to Date Aspirin: no   HYPERTENSION / HYPERLIPIDEMIA Taking Losartan  25 MG and Fenofibrate  daily. Satisfied with current treatment? yes Duration of hypertension: chronic BP monitoring frequency: not checking BP range:  BP medication side effects: no Duration of hyperlipidemia: chronic Cholesterol medication side effects: no Cholesterol supplements: none Medication compliance: good compliance Aspirin: no Recent stressors: no Recurrent headaches: no Visual changes: no Palpitations: no Dyspnea: no Chest pain: no Lower extremity edema: no Dizzy/lightheaded: no   Has Klonopin  to use, but only uses sporadically for anxiety. Has had extra days where he has felt extra anxious.  Continues to drink alcohol.    10/17/2023    1:10 PM 07/12/2023   10:33 AM 05/31/2022    2:04 PM 04/08/2022    4:39 PM 03/17/2022    4:14 PM  Depression screen PHQ 2/9   Decreased Interest 1 0 1 1 0  Down, Depressed, Hopeless 1 0 1 1 0  PHQ - 2 Score 2 0 2 2 0  Altered sleeping 2 1 0 2 0  Tired, decreased energy 3 2 3 2  0  Change in appetite 3 2 3 2 3   Feeling bad or failure about yourself  0 0 1 2 3   Trouble concentrating 0 0 0 2 0  Moving slowly or fidgety/restless 0 0 0 2 0  Suicidal thoughts 0 0 0 1 0  PHQ-9 Score 10 5 9 15 6   Difficult doing work/chores Very difficult Somewhat difficult Somewhat difficult Somewhat difficult Somewhat difficult       10/17/2023    1:10 PM 07/12/2023   10:33 AM 05/31/2022    2:05 PM 04/08/2022    4:39 PM  GAD 7 : Generalized Anxiety Score  Nervous, Anxious, on Edge 2 1 1 1   Control/stop worrying 2 0 1 1  Worry too much - different things 2 0 1 1  Trouble relaxing 2 0 1 1  Restless 2 0 1 1  Easily annoyed or irritable 2 0 1 1  Afraid - awful might happen 2 1 1 1   Total GAD 7 Score 14 2 7 7   Anxiety Difficulty Very difficult Somewhat difficult Somewhat difficult Somewhat difficult   Relevant past medical, surgical, family and social history reviewed and updated as indicated. Interim medical history since our last  visit reviewed. Allergies and medications reviewed and updated.  Review of Systems  Constitutional:  Negative for activity change, diaphoresis, fatigue and fever.  Respiratory:  Negative for cough, chest tightness, shortness of breath and wheezing.   Cardiovascular:  Negative for chest pain, palpitations and leg swelling.  Gastrointestinal: Negative.   Endocrine: Negative for polydipsia, polyphagia and polyuria.  Neurological: Negative.   Psychiatric/Behavioral: Negative.     Per HPI unless specifically indicated above     Objective:    BP 122/75   Pulse 62   Temp 97.6 F (36.4 C) (Oral)   Ht 5' 5 (1.651 m)   Wt 228 lb 6.4 oz (103.6 kg)   SpO2 97%   BMI 38.01 kg/m   Wt Readings from Last 3 Encounters:  10/17/23 228 lb 6.4 oz (103.6 kg)  08/09/23 240 lb 6.4 oz (109 kg)  07/12/23 238 lb  9.6 oz (108.2 kg)    Physical Exam Vitals and nursing note reviewed.  Constitutional:      General: He is awake. He is not in acute distress.    Appearance: He is well-developed and well-groomed. He is obese. He is not ill-appearing or toxic-appearing.  HENT:     Head: Normocephalic.     Right Ear: Hearing and external ear normal.     Left Ear: Hearing and external ear normal.  Eyes:     General: Lids are normal.     Extraocular Movements: Extraocular movements intact.     Conjunctiva/sclera: Conjunctivae normal.  Neck:     Thyroid: No thyromegaly.     Vascular: No carotid bruit.  Cardiovascular:     Rate and Rhythm: Normal rate and regular rhythm.     Heart sounds: Normal heart sounds. No murmur heard.    No gallop.  Pulmonary:     Effort: No accessory muscle usage or respiratory distress.     Breath sounds: Normal breath sounds.  Abdominal:     General: Bowel sounds are normal. There is no distension.     Palpations: Abdomen is soft.     Tenderness: There is no abdominal tenderness.  Musculoskeletal:     Cervical back: Full passive range of motion without pain.     Right lower leg: No edema.     Left lower leg: No edema.  Lymphadenopathy:     Cervical: No cervical adenopathy.  Skin:    General: Skin is warm.     Capillary Refill: Capillary refill takes less than 2 seconds.  Neurological:     Mental Status: He is alert and oriented to person, place, and time.     Deep Tendon Reflexes: Reflexes are normal and symmetric.     Reflex Scores:      Brachioradialis reflexes are 2+ on the right side and 2+ on the left side.      Patellar reflexes are 2+ on the right side and 2+ on the left side. Psychiatric:        Attention and Perception: Attention normal.        Mood and Affect: Mood normal.        Speech: Speech normal.        Behavior: Behavior normal. Behavior is cooperative.        Thought Content: Thought content normal.    Diabetic Foot Exam - Simple   Simple  Foot Form Visual Inspection No deformities, no ulcerations, no other skin breakdown bilaterally: Yes Sensation Testing Intact to touch and monofilament testing bilaterally: Yes Pulse Check Posterior  Tibialis and Dorsalis pulse intact bilaterally: Yes Comments      Results for orders placed or performed in visit on 07/12/23  Bayer DCA Hb A1c Waived   Collection Time: 07/12/23 10:33 AM  Result Value Ref Range   HB A1C (BAYER DCA - WAIVED) 11.6 (H) 4.8 - 5.6 %  Microalbumin, Urine Waived   Collection Time: 07/12/23 10:33 AM  Result Value Ref Range   Microalb, Ur Waived 80 (H) 0 - 19 mg/L   Creatinine, Urine Waived 100 10 - 300 mg/dL   Microalb/Creat Ratio <30 <30 mg/g  Lipid Panel w/o Chol/HDL Ratio   Collection Time: 07/12/23 10:33 AM  Result Value Ref Range   Cholesterol, Total 301 (H) 100 - 199 mg/dL   Triglycerides 8,297 (HH) 0 - 149 mg/dL   HDL 13 (L) >60 mg/dL   VLDL Cholesterol Cal Comment (A) 5 - 40 mg/dL   LDL Chol Calc (NIH) Comment (A) 0 - 99 mg/dL   LDL CALC COMMENT: Comment       Assessment & Plan:   Problem List Items Addressed This Visit       Cardiovascular and Mediastinum   Hypertension associated with diabetes (HCC)   Chronic, stable.  BP at goal in office.  Continue current medication regimen as is tolerating and ARB is kidney protective.  Check BP at home at least 3 mornings a week. Focus on DASH diet recommended. Urine ALB 80 May 2025.      Relevant Medications   fenofibrate  (TRICOR ) 145 MG tablet   losartan  (COZAAR ) 25 MG tablet   metFORMIN  (GLUCOPHAGE ) 1000 MG tablet   Other Relevant Orders   Bayer DCA Hb A1c Waived     Endocrine   Type 2 diabetes mellitus with obesity (HCC) - Primary   Diagnosed on 07/10/2023 with A1c 11.6%. A1c today 5.6%, praised for success.  Suspect diet played a big role in elevations of sugar, was drinking 6 sodas a day, now has stopped these. Urine ALB 80 May 2025.   - Continue Metformin  1000 MG BID as is tolerating.  To  check BS BID, has log to use and knows goals. - Provided him print outs on diet changes at last visit - On ARB for BP and proteinuria, but no statin at present. - Eye and Foot exams up to date - Needs PCV20, flu up to date.      Relevant Medications   losartan  (COZAAR ) 25 MG tablet   metFORMIN  (GLUCOPHAGE ) 1000 MG tablet   Other Relevant Orders   Bayer DCA Hb A1c Waived   Hyperlipidemia associated with type 2 diabetes mellitus (HCC)   Chronic, ongoing.  Will remain off Fenofibrate  at this time for medication minimization.  Continue to support on alcohol cessation journey.  Recommend continued focus on diet and exercise.  Lipid panel today and discuss statin at future visits.      Relevant Medications   fenofibrate  (TRICOR ) 145 MG tablet   losartan  (COZAAR ) 25 MG tablet   metFORMIN  (GLUCOPHAGE ) 1000 MG tablet   Other Relevant Orders   Bayer DCA Hb A1c Waived   Comprehensive metabolic panel with GFR   Lipid Panel w/o Chol/HDL Ratio     Other   Obesity   BMI 38.01.  Recommended eating smaller high protein, low fat meals more frequently and exercising 30 mins a day 5 times a week with a goal of 10-15lb weight loss in the next 3 months. Patient voiced their understanding and motivation to adhere to these  recommendations.       Relevant Medications   metFORMIN  (GLUCOPHAGE ) 1000 MG tablet   Long-term current use of benzodiazepine   Refer to anxiety plan of care.      EtOH dependence (HCC)   Anxiety and depression   Chronic, ongoing.  Continues Klonopin  for sleep, started by previous PCP.  Recommend minimal use of this and helps prevent him from turning to alcohol when anxious.  Pt is aware of risks of benzo medication use to include increased sedation, respiratory suppression, falls, dependence and cardiovascular events.  Pt would like to continue treatment as benefit determined to outweigh risk.  UDS today and contract next visit. Continue to recommend alternate medications and  support on alcohol cessation.           Follow up plan: Return in about 6 months (around 04/18/2024) for T2DM, HTN/HLD.

## 2023-10-18 ENCOUNTER — Ambulatory Visit: Payer: Self-pay | Admitting: Nurse Practitioner

## 2023-10-18 LAB — COMPREHENSIVE METABOLIC PANEL WITH GFR
ALT: 27 IU/L (ref 0–44)
AST: 20 IU/L (ref 0–40)
Albumin: 4.6 g/dL (ref 4.1–5.1)
Alkaline Phosphatase: 61 IU/L (ref 44–121)
BUN/Creatinine Ratio: 11 (ref 9–20)
BUN: 14 mg/dL (ref 6–20)
Bilirubin Total: 0.4 mg/dL (ref 0.0–1.2)
CO2: 22 mmol/L (ref 20–29)
Calcium: 9.6 mg/dL (ref 8.7–10.2)
Chloride: 102 mmol/L (ref 96–106)
Creatinine, Ser: 1.27 mg/dL (ref 0.76–1.27)
Globulin, Total: 2.5 g/dL (ref 1.5–4.5)
Glucose: 86 mg/dL (ref 70–99)
Potassium: 4.8 mmol/L (ref 3.5–5.2)
Sodium: 138 mmol/L (ref 134–144)
Total Protein: 7.1 g/dL (ref 6.0–8.5)
eGFR: 74 mL/min/1.73 (ref >59)

## 2023-10-18 LAB — LIPID PANEL W/O CHOL/HDL RATIO
Cholesterol, Total: 222 mg/dL — ABNORMAL HIGH (ref 100–199)
HDL: 24 mg/dL — ABNORMAL LOW (ref >39)
LDL Chol Calc (NIH): 167 mg/dL — ABNORMAL HIGH (ref 0–99)
Triglycerides: 168 mg/dL — ABNORMAL HIGH (ref 0–149)
VLDL Cholesterol Cal: 31 mg/dL (ref 5–40)

## 2023-10-18 NOTE — Progress Notes (Signed)
 Contacted via MyChart  Good morning [name], your labs have returned: - Kidney function, creatinine and eGFR, remains normal, as is liver function, AST and ALT.  - Lipid panel is still showing elevations, but improved from past check.  Continue all current medications. Any questions? Keep being stellar!!  Thank you for allowing me to participate in your care.  I appreciate you. Kindest regards, Tari Lecount

## 2023-11-27 ENCOUNTER — Other Ambulatory Visit: Payer: Self-pay | Admitting: Nurse Practitioner

## 2023-11-29 NOTE — Telephone Encounter (Signed)
 Requested medication (s) are due for refill today: no  Requested medication (s) are on the active medication list: yes  Last refill:  10/17/23 #35 2 RF  Future visit scheduled: yes  Notes to clinic:  med not delegated to NT to RF    Requested Prescriptions  Pending Prescriptions Disp Refills   clonazePAM  (KLONOPIN ) 0.5 MG tablet [Pharmacy Med Name: CLONAZEPAM  0.5 MG TABLET] 30 tablet 2    Sig: TAKE 1 TABLET BY MOUTH DAILY AS NEEDED FOR ANXIETY. WILL NEED VISIT FOR FURTHER REFILLS.     Not Delegated - Psychiatry: Anxiolytics/Hypnotics 2 Failed - 11/29/2023  3:28 PM      Failed - This refill cannot be delegated      Failed - Urine Drug Screen completed in last 360 days      Passed - Patient is not pregnant      Passed - Valid encounter within last 6 months    Recent Outpatient Visits           1 month ago Type 2 diabetes mellitus with obesity   [facility] [place], [place] T, NP   3 months ago Type 2 diabetes mellitus with obesity   [place] [facility] [place], [place] T, NP   4 months ago Type 2 diabetes mellitus with obesity   [place] [facility] [place], Melanie DASEN, NP

## 2023-12-06 ENCOUNTER — Other Ambulatory Visit: Payer: Self-pay | Admitting: Nurse Practitioner

## 2023-12-06 ENCOUNTER — Encounter: Payer: Self-pay | Admitting: Nurse Practitioner

## 2023-12-06 MED ORDER — HYDROXYZINE PAMOATE 50 MG PO CAPS: 50.0000 mg | ORAL_CAPSULE | Freq: Four times a day (QID) | ORAL | 4 refills | Status: AC | PRN

## 2023-12-06 MED ORDER — HYDROXYZINE PAMOATE 25 MG PO CAPS: 25.0000 mg | ORAL_CAPSULE | Freq: Four times a day (QID) | ORAL | 0 refills | Status: AC | PRN

## 2023-12-23 ENCOUNTER — Ambulatory Visit: Payer: Self-pay

## 2023-12-23 NOTE — Telephone Encounter (Signed)
 Copied from CRM [id number]. Topic: Clinical - Red Word Triage >> Dec 23, 2023 10:12 AM Treva T wrote: [facility] that prompted transfer to Nurse Triage: Patient calling requesting an appointment to be evaluated for increased productive coughing, ear pain, with both nasal and chest congestion. Reason for Disposition . Earache  Answer Assessment - Initial Assessment Questions 1. ONSET: When did the cough begin?      Monday  2. SEVERITY: How bad is the cough today?      *No Answer* 3. SPUTUM: Describe the color of your sputum (e.g., none, dry cough; clear, white, yellow, green)     clear 4. HEMOPTYSIS: Are you coughing up any blood? If Yes, ask: How much? (e.g., flecks, streaks, tablespoons, etc.)     Flecks or streaks 5. DIFFICULTY BREATHING: Are you having difficulty breathing? If Yes, ask: How bad is it? (e.g., mild, moderate, severe)      no 6. FEVER: Do you have a fever? If Yes, ask: What is your temperature, how was it measured, and when did it start?     no 7. CARDIAC HISTORY: Do you have any history of heart disease? (e.g., heart attack, congestive heart failure)      Hld, htn 8. LUNG HISTORY: Do you have any history of lung disease?  (e.g., pulmonary embolus, asthma, emphysema)     no  10. OTHER SYMPTOMS: Do you have any other symptoms? (e.g., runny nose, wheezing, chest pain)       Ear pain intermittent right side 5  Protocols used: Cough - Acute Productive-A-AH  Reason for Disposition . Earache  Answer Assessment - Initial Assessment Questions PT states that he started with a sore throat on Monday or Tuesday and then started to have a cough and this intermittent right sided ear pain. RN offered appt at [facility] on Monday or [facility] today. He states he either needs it be like now or late today. Rn advised patient if he wants to be seen today and we can't accommodate his best option is to go to [facility]. RN gave him the closest location to his address. He  stated thank you.     1. ONSET: When did the cough begin?      Monday   3. SPUTUM: Describe the color of your sputum (e.g., none, dry cough; clear, white, yellow, green)     clear 4. HEMOPTYSIS: Are you coughing up any blood? If Yes, ask: How much? (e.g., flecks, streaks, tablespoons, etc.)     Flecks or streaks 5. DIFFICULTY BREATHING: Are you having difficulty breathing? If Yes, ask: How bad is it? (e.g., mild, moderate, severe)      no 6. FEVER: Do you have a fever? If Yes, ask: What is your temperature, how was it measured, and when did it start?     no 7. CARDIAC HISTORY: Do you have any history of heart disease? (e.g., heart attack, congestive heart failure)      Hld, htn 8. LUNG HISTORY: Do you have any history of lung disease?  (e.g., pulmonary embolus, asthma, emphysema)     no  10. OTHER SYMPTOMS: Do you have any other symptoms? (e.g., runny nose, wheezing, chest pain)       Ear pain intermittent right side 5  Protocols used: Cough - Acute Productive-A-AH

## 2023-12-23 NOTE — Telephone Encounter (Signed)
 FYI Only or Action Required?: FYI only for provider.  Patient was last seen in primary care on 10/17/2023 by Cannady, Jolene T, NP.  Called Nurse Triage reporting Cough.  Symptoms began several days ago.  Interventions attempted: Nothing.  Symptoms are: gradually worsening.  Triage Disposition: See Physician Within 24 Hours  Patient/caregiver understands and will follow disposition?: No, refuses disposition  PT couldn't do appt offered today and didn't reply about appt for Monday. RN advised UC if he'd like to be seen today. He thanked Charity fundraiser and stated he would see what he could do.

## 2024-01-13 ENCOUNTER — Encounter: Payer: Self-pay | Admitting: Nurse Practitioner

## 2024-01-13 ENCOUNTER — Telehealth (INDEPENDENT_AMBULATORY_CARE_PROVIDER_SITE_OTHER): Payer: Self-pay | Admitting: Nurse Practitioner

## 2024-01-13 VITALS — Ht 65.0 in | Wt 217.0 lb

## 2024-01-13 DIAGNOSIS — J40 Bronchitis, not specified as acute or chronic: Secondary | ICD-10-CM

## 2024-01-13 MED ORDER — AZITHROMYCIN 250 MG PO TABS: ORAL_TABLET | ORAL | 0 refills | Status: AC

## 2024-01-13 MED ORDER — BENZONATATE 100 MG PO CAPS: 100.0000 mg | ORAL_CAPSULE | Freq: Two times a day (BID) | ORAL | 0 refills | Status: AC | PRN

## 2024-01-13 MED ORDER — PREDNISONE 20 MG PO TABS: 40.0000 mg | ORAL_TABLET | Freq: Every day | ORAL | 0 refills | Status: AC

## 2024-01-13 NOTE — Patient Instructions (Signed)

## 2024-01-13 NOTE — Progress Notes (Signed)
 Ht 5' 5 (1.651 m)   Wt 217 lb (98.4 kg)   BMI 36.11 kg/m    Subjective:    Patient ID: Nathan JONETTA Arloa Mickey., male    DOB: 11-09-1985, 38 y.o.   MRN: [id number]  HPI: Nathan Beitler. is a 38 y.o. male  Chief Complaint  Patient presents with   Chest Congestion    Started about 3-4 weeks with sore throat. Ongoing chest congestion, coughing. Uses OTC dayquil.    Virtual Visit via Video Note  I connected with Nathan JONETTA Arloa Mickey. on 01/13/24 at 11:00 AM EST by a video enabled telemedicine application and verified that I am speaking with the correct person using two identifiers.  Location: Patient: home Provider: work   I discussed the limitations of evaluation and management by telemedicine and the availability of in person appointments. The patient expressed understanding and agreed to proceed.  I discussed the assessment and treatment plan with the patient. The patient was provided an opportunity to ask questions and all were answered. The patient agreed with the plan and demonstrated an understanding of the instructions.   The patient was advised to call back or seek an in-person evaluation if the symptoms worsen or if the condition fails to improve as anticipated.  I provided 25 minutes of non-face-to-face time during this encounter.   Yalexa Blust T Jaheim Canino, NP   UPPER RESPIRATORY TRACT INFECTION Has had a cough for 3-4 weeks. Started out as a sore throat and congestion, lasted 48 hours. Has been using OTC medications without benefit. Fever: no Cough: yes Shortness of breath: no Wheezing: minimal Chest pain: no Chest tightness: no Chest congestion: yes Nasal congestion: occasional Runny nose: no Post nasal drip: yes Sneezing: no Sore throat: initially, but gone now Swollen glands: no Sinus pressure: a little from time to time Headache: no Face pain: no Toothache: no Ear pain: none Ear pressure: after sore throat went away her ear got stopped up, this has  improved Eyes red/itching:no Eye drainage/crusting: no  Vomiting: no Rash: no Fatigue: yes Sick contacts: no Strep contacts: no  Context: fluctuating Recurrent sinusitis: no Relief with OTC cold/cough medications: no  Treatments attempted: cold/sinus and mucinex  - Dayquil high blood pressure    Relevant past medical, surgical, family and social history reviewed and updated as indicated. Interim medical history since our last visit reviewed. Allergies and medications reviewed and updated.  Review of Systems  Constitutional:  Positive for fatigue. Negative for activity change, appetite change, diaphoresis and fever.  HENT:  Positive for congestion (occasional). Negative for ear discharge, ear pain, postnasal drip, rhinorrhea, sinus pressure, sinus pain and sore throat.   Respiratory:  Positive for cough and wheezing (minimal). Negative for chest tightness and shortness of breath.   Cardiovascular:  Negative for chest pain, palpitations and leg swelling.  Gastrointestinal: Negative.   Neurological: Negative.   Psychiatric/Behavioral: Negative.      Per HPI unless specifically indicated above     Objective:    Ht 5' 5 (1.651 m)   Wt 217 lb (98.4 kg)   BMI 36.11 kg/m   Wt Readings from Last 3 Encounters:  01/13/24 217 lb (98.4 kg)  10/17/23 228 lb 6.4 oz (103.6 kg)  08/09/23 240 lb 6.4 oz (109 kg)    Physical Exam Vitals and nursing note reviewed.  Constitutional:      General: He is awake. He is not in acute distress.    Appearance: He is well-developed and well-groomed. He is  obese. He is not ill-appearing or toxic-appearing.  HENT:     Head: Normocephalic.     Right Ear: Hearing normal. No drainage.     Left Ear: Hearing normal. No drainage.  Eyes:     General: Lids are normal.        Right eye: No discharge.        Left eye: No discharge.     Conjunctiva/sclera: Conjunctivae normal.  Pulmonary:     Effort: Pulmonary effort is normal. No accessory muscle usage or  respiratory distress.  Musculoskeletal:     Cervical back: Normal range of motion.  Neurological:     Mental Status: He is alert and oriented to person, place, and time.  Psychiatric:        Mood and Affect: Mood normal.        Behavior: Behavior normal. Behavior is cooperative.        Thought Content: Thought content normal.        Judgment: Judgment normal.     Results for orders placed or performed in visit on 10/17/23  Bayer DCA Hb A1c Waived   Collection Time: 10/17/23  1:16 PM  Result Value Ref Range   HB A1C (BAYER DCA - WAIVED) 5.6 4.8 - 5.6 %  Comprehensive metabolic panel with GFR   Collection Time: 10/17/23  1:31 PM  Result Value Ref Range   Glucose 86 70 - 99 mg/dL   BUN 14 6 - 20 mg/dL   Creatinine, Ser 8.72 0.76 - 1.27 mg/dL   eGFR 74 >40 fO/fpw/8.26   BUN/Creatinine Ratio 11 9 - 20   Sodium 138 134 - 144 mmol/L   Potassium 4.8 3.5 - 5.2 mmol/L   Chloride 102 96 - 106 mmol/L   CO2 22 20 - 29 mmol/L   Calcium 9.6 8.7 - 10.2 mg/dL   Total Protein 7.1 6.0 - 8.5 g/dL   Albumin 4.6 4.1 - 5.1 g/dL   Globulin, Total 2.5 1.5 - 4.5 g/dL   Bilirubin Total 0.4 0.0 - 1.2 mg/dL   Alkaline Phosphatase 61 44 - 121 IU/L   AST 20 0 - 40 IU/L   ALT 27 0 - 44 IU/L  Lipid Panel w/o Chol/HDL Ratio   Collection Time: 10/17/23  1:31 PM  Result Value Ref Range   Cholesterol, Total 222 (H) 100 - 199 mg/dL   Triglycerides 831 (H) 0 - 149 mg/dL   HDL 24 (L) >60 mg/dL   VLDL Cholesterol Cal 31 5 - 40 mg/dL   LDL Chol Calc (NIH) 832 (H) 0 - 99 mg/dL      Assessment & Plan:   Problem List Items Addressed This Visit       Respiratory   Bronchitis - Primary   Ongoing cough for 3 to 4 weeks, at this time will start Zpack and Prednisone  40 MG daily for 5 days total. Tessalon  sent in to take as needed.  Recommend: - Increased rest - Increasing Fluids - Acetaminophen  / ibuprofen  as needed for fever/pain.  - Salt water gargling, chloraseptic spray and throat lozenges - Mucinex .   - Coricidin - Humidifying the air.  - If ongoing then would consider imaging.        Follow up plan: Return if symptoms worsen or fail to improve.

## 2024-01-13 NOTE — Assessment & Plan Note (Signed)
 Ongoing cough for 3 to 4 weeks, at this time will start Zpack and Prednisone  40 MG daily for 5 days total. Tessalon  sent in to take as needed.  Recommend: - Increased rest - Increasing Fluids - Acetaminophen  / ibuprofen  as needed for fever/pain.  - Salt water gargling, chloraseptic spray and throat lozenges - Mucinex .  - Coricidin - Humidifying the air.  - If ongoing then would consider imaging.

## 2024-01-24 ENCOUNTER — Ambulatory Visit: Payer: Self-pay | Admitting: Pediatrics

## 2024-01-24 ENCOUNTER — Ambulatory Visit: Payer: Self-pay

## 2024-01-24 NOTE — Telephone Encounter (Signed)
 Scheduled

## 2024-01-24 NOTE — Telephone Encounter (Signed)
 FYI Only or Action Required?: Action required by provider: request for appointment.  Patient was last seen in primary care on 01/13/2024 by Nathan Melanie DASEN, NP.  Called Nurse Triage reporting Sore.  Symptoms began several years ago. Now open and bleeding X 1 week  Interventions attempted: Nothing.  Symptoms are: gradually worsening.  Triage Disposition: See Physician Within 24 Hours  Patient/caregiver understands and will follow disposition?: No, refuses disposition   Copied from CRM #[id number]. Topic: Clinical - Red Word Triage >> Jan 24, 2024 10:06 AM Myrick T wrote: Red Word that prompted transfer to Nurse Triage: patient called stated he has a hole on his waistline by his underwear line and is now bleeding. The issue has gotten worse over the past week.   ----------------------------------------------------------------------- From previous Reason for Contact - Scheduling: Patient/patient representative is calling to schedule an appointment. Refer to attachments for appointment information. Reason for Disposition  [1] Boil AND [2] diabetes mellitus or weak immune system (e.g., HIV positive, cancer chemo, splenectomy, organ transplant, chronic steroids)  Answer Assessment - Initial Assessment Questions Additional info: Offered acute visit today with alternate provider but he refuses this, he is insisting on work in with pcp only. Please follow up with patient to let him know if he can be worked into [employer] schedule.     1. APPEARANCE of BOIL: What does the boil look like?      Hole  2. LOCATION: Where is the boil located?      Waistline at surgical scar 3. NUMBER: How many boils are there?      one 4. SIZE: How big is the boil? (e.g., inches, cm; compare to size of a coin or other object)     small 5. ONSET: When did the boil start?     Two years worsening one week, now bleeding  6. PAIN: Is there any pain? If Yes, ask: How bad is the pain?   (Scale 1-10; or  mild, moderate, severe)     tender 7. FEVER: Do you have a fever? If Yes, ask: What is it, how was it measured, and when did it start?      Denies  8. SOURCE: Have you been around anyone with boils or other Staph infections? Have you ever had boils before?      9. OTHER SYMPTOMS: Do you have any other symptoms? (e.g., shaking chills, weakness, rash elsewhere on body)     denies 10. PREGNANCY: Is there any chance you are pregnant? When was your last menstrual period?  Protocols used: Boil (Skin Abscess)-A-AH

## 2024-01-25 ENCOUNTER — Encounter: Payer: Self-pay | Admitting: Nurse Practitioner

## 2024-01-25 ENCOUNTER — Ambulatory Visit: Payer: Self-pay | Admitting: Nurse Practitioner

## 2024-01-25 VITALS — BP 118/81 | HR 73 | Temp 98.5°F | Resp 15 | Ht 65.0 in | Wt 221.8 lb

## 2024-01-25 DIAGNOSIS — L03818 Cellulitis of other sites: Secondary | ICD-10-CM

## 2024-01-25 DIAGNOSIS — L0390 Cellulitis, unspecified: Secondary | ICD-10-CM | POA: Insufficient documentation

## 2024-01-25 MED ORDER — SULFAMETHOXAZOLE-TRIMETHOPRIM 800-160 MG PO TABS: 1.0000 | ORAL_TABLET | Freq: Two times a day (BID) | ORAL | 0 refills | Status: AC

## 2024-01-25 MED ORDER — FLUCONAZOLE 150 MG PO TABS: 150.0000 mg | ORAL_TABLET | Freq: Every day | ORAL | 0 refills | Status: AC

## 2024-01-25 NOTE — Patient Instructions (Signed)
 Cellulitis, Adult    Cellulitis is a skin infection. The infected area is often warm, red, swollen, and sore. It occurs most often on the legs, feet, and toes, but can happen on any part of the body.  This condition can be life-threatening without treatment. It is very important to get treated right away.  What are the causes?  This condition is caused by bacteria. The bacteria enter through a break in the skin, such as:  A cut.  A burn.  A bug bite.  An animal bite.  An open sore.  A crack.  What increases the risk?  Having a weak body's defense system (immune system).  Being older than 39 years old.  Having a blood sugar problem (diabetes).  Having a long-term liver disease (cirrhosis) or kidney disease.  Being very overweight (obese).  Having a skin problem, such as:  An itchy rash.  A rash caused by a fungus.  A rash with blisters.  Slow movement of blood in the veins (venous stasis).  Fluid buildup below the skin (edema).  This condition is more likely to occur in people who:  Have open cuts, burns, bites, or scrapes on the skin.  Have been treated with high-energy rays (radiation).  Use IV drugs.  What are the signs or symptoms?  Skin that:  Looks red or purple, or slightly darker than your usual skin color.  Has streaks.  Has spots.  Is swollen.  Is sore or painful when you touch it.  Is warm.  A fever.  Chills.  Blisters.  Tiredness (fatigue).  How is this treated?  Medicines to treat infections or allergies.  Rest.  Placing cold or warm cloths on the skin.  Staying in the hospital, if the condition is very bad. You may need medicines through an IV.  Follow these instructions at home:  Medicines  Take over-the-counter and prescription medicines only as told by your doctor.  If you were prescribed antibiotics, take them as told by your doctor. Do not stop using them even if you start to feel better.  General instructions  Drink enough fluid to keep your pee (urine) pale yellow.  Do not touch or rub the  infected area.  Raise (elevate) the infected area above the level of your heart while you are sitting or lying down.  Return to your normal activities when your doctor says that it is safe.  Place cold or warm cloths on the area as told by your doctor.  Keep all follow-up visits. Your doctor will need to make sure that a more serious infection is not developing.  Contact a doctor if:  You have a fever.  You do not start to get better after 1-2 days of treatment.  Your bone or joint under the infected area starts to hurt after the skin has healed.  Your infection comes back in the same area or another area. Signs of this may include:  You have a swollen bump in the area.  Your red area gets larger, turns dark in color, or hurts more.  You have more fluid coming from the wound.  Pus or a bad smell develops in your infected area.  You have more pain.  You feel sick and have muscle aches and weakness.  You develop vomiting or watery poop that will not go away.  Get help right away if:  You see red streaks coming from the area.  You notice the skin turns purple or black and falls  off.  These symptoms may be an emergency. Get help right away. Call 911.  Do not wait to see if the symptoms will go away.  Do not drive yourself to the hospital.  This information is not intended to replace advice given to you by your health care provider. Make sure you discuss any questions you have with your health care provider.  Document Revised: 10/13/2021 Document Reviewed: 10/13/2021  Elsevier Patient Education  2024 [employer].

## 2024-01-25 NOTE — Assessment & Plan Note (Signed)
 Acute to skin lower mid abdomen. Suspect some folliculitis which has been recurrent to area post past surgery. Area drained on own. Will start Bactrim  BID and recommend he cleanse area twice a day and apply abx ointment. Script for Fluconazole  sent to take as needed if yeast presents with abx therapy. Monitor for any worsening or fever, if presents be seen immediately.

## 2024-01-25 NOTE — Progress Notes (Signed)
 BP 118/81 (BP Location: Left Arm, Patient Position: Sitting, Cuff Size: Large)   Pulse 73   Temp 98.5 F (36.9 C) (Oral)   Resp 15   Ht 5' 5 (1.651 m)   Wt 221 lb 12.8 oz (100.6 kg)   SpO2 98%   BMI 36.91 kg/m    Subjective:    Patient ID: [name]., male    DOB: 1985-10-16, 38 y.o.   MRN: [id number]  HPI: [name]. is a 38 y.o. male  Chief Complaint  Patient presents with   Skin Problem    Waist line. Has a hole with scar tissue with possible ingrown hairs.    SKIN INFECTION Started with infection to lower right abdomen a week ago. Has history of having general surgery cauterizing area. A couple days ago it started draining. Duration: weeks Location: lower right abdomen History of trauma in area: no Pain: yes just when poke at it Quality: yes Severity: mild Redness: yes Swelling: yes Oozing: yes Pus: no Fevers: no Nausea/vomiting: no Status: stable Treatments attempted:warm compresses  Tetanus: UTD   Relevant past medical, surgical, family and social history reviewed and updated as indicated. Interim medical history since our last visit reviewed. Allergies and medications reviewed and updated.  Review of Systems  Constitutional:  Negative for activity change, diaphoresis, fatigue and fever.  Respiratory:  Negative for cough, chest tightness, shortness of breath and wheezing.   Cardiovascular:  Negative for chest pain, palpitations and leg swelling.  Gastrointestinal: Negative.   Skin:  Positive for wound.  Neurological: Negative.   Psychiatric/Behavioral: Negative.      Per HPI unless specifically indicated above     Objective:    BP 118/81 (BP Location: Left Arm, Patient Position: Sitting, Cuff Size: Large)   Pulse 73   Temp 98.5 F (36.9 C) (Oral)   Resp 15   Ht 5' 5 (1.651 m)   Wt 221 lb 12.8 oz (100.6 kg)   SpO2 98%   BMI 36.91 kg/m   Wt Readings from Last 3 Encounters:  01/25/24 221 lb 12.8 oz (100.6 kg)  01/13/24 217 lb  (98.4 kg)  10/17/23 228 lb 6.4 oz (103.6 kg)    Physical Exam Vitals and nursing note reviewed.  Constitutional:      General: He is not in acute distress.    Appearance: He is well-developed and well-groomed. He is obese. He is not ill-appearing.  HENT:     Head: Normocephalic and atraumatic.     Right Ear: Hearing normal. No drainage.     Left Ear: Hearing normal. No drainage.  Eyes:     General: Lids are normal. No scleral icterus.       Right eye: No discharge.        Left eye: No discharge.     Conjunctiva/sclera: Conjunctivae normal.     Pupils: Pupils are equal, round, and reactive to light.  Neck:     Thyroid: No thyromegaly.     Vascular: No carotid bruit.  Cardiovascular:     Rate and Rhythm: Normal rate and regular rhythm.     Heart sounds: Normal heart sounds, S1 normal and S2 normal. No murmur heard.    No gallop.  Pulmonary:     Effort: Pulmonary effort is normal. No accessory muscle usage or respiratory distress.     Breath sounds: Normal breath sounds.  Abdominal:     General: Bowel sounds are normal. There is no distension.  Palpations: Abdomen is soft. There is no hepatomegaly.     Tenderness: There is no abdominal tenderness.  Musculoskeletal:        General: Normal range of motion.     Cervical back: Normal range of motion and neck supple.     Right lower leg: No edema.     Left lower leg: No edema.  Skin:    General: Skin is warm and dry.     Findings: Abscess present.      Neurological:     Mental Status: He is alert and oriented to person, place, and time.  Psychiatric:        Mood and Affect: Mood normal.        Speech: Speech normal.        Behavior: Behavior normal.        Thought Content: Thought content normal.     Results for orders placed or performed in visit on 10/17/23  Bayer DCA Hb A1c Waived   Collection Time: 10/17/23  1:16 PM  Result Value Ref Range   HB A1C (BAYER DCA - WAIVED) 5.6 4.8 - 5.6 %  Comprehensive metabolic  panel with GFR   Collection Time: 10/17/23  1:31 PM  Result Value Ref Range   Glucose 86 70 - 99 mg/dL   BUN 14 6 - 20 mg/dL   Creatinine, Ser 8.72 0.76 - 1.27 mg/dL   eGFR 74 >40 fO/fpw/8.26   BUN/Creatinine Ratio 11 9 - 20   Sodium 138 134 - 144 mmol/L   Potassium 4.8 3.5 - 5.2 mmol/L   Chloride 102 96 - 106 mmol/L   CO2 22 20 - 29 mmol/L   Calcium 9.6 8.7 - 10.2 mg/dL   Total Protein 7.1 6.0 - 8.5 g/dL   Albumin 4.6 4.1 - 5.1 g/dL   Globulin, Total 2.5 1.5 - 4.5 g/dL   Bilirubin Total 0.4 0.0 - 1.2 mg/dL   Alkaline Phosphatase 61 44 - 121 IU/L   AST 20 0 - 40 IU/L   ALT 27 0 - 44 IU/L  Lipid Panel w/o Chol/HDL Ratio   Collection Time: 10/17/23  1:31 PM  Result Value Ref Range   Cholesterol, Total 222 (H) 100 - 199 mg/dL   Triglycerides 831 (H) 0 - 149 mg/dL   HDL 24 (L) >60 mg/dL   VLDL Cholesterol Cal 31 5 - 40 mg/dL   LDL Chol Calc (NIH) 832 (H) 0 - 99 mg/dL      Assessment & Plan:   Problem List Items Addressed This Visit       Other   Cellulitis - Primary   Acute to skin lower mid abdomen. Suspect some folliculitis which has been recurrent to area post past surgery. Area drained on own. Will start Bactrim  BID and recommend he cleanse area twice a day and apply abx ointment. Script for Fluconazole  sent to take as needed if yeast presents with abx therapy. Monitor for any worsening or fever, if presents be seen immediately.        Follow up plan: Return if symptoms worsen or fail to improve.

## 2024-03-30 ENCOUNTER — Other Ambulatory Visit: Payer: Self-pay | Admitting: Nurse Practitioner

## 2024-04-02 NOTE — Telephone Encounter (Signed)
 Requested medication (s) are due for refill today - yes  Requested medication (s) are on the active medication list -yes  Future visit scheduled -yes  Last refill: 11/29/23 #30 2RF  Notes to clinic: non delegated Rx, last refill has notes   Requested Prescriptions  Pending Prescriptions Disp Refills   clonazePAM  (KLONOPIN ) 0.5 MG tablet [Pharmacy Med Name: CLONAZEPAM  0.5 MG TABLET] 30 tablet 2    Sig: TAKE 1 TABLET BY MOUTH DAILY AS NEEDED FOR ANXIETY. WILL NEED VISIT FOR FURTHER REFILLS.     Not Delegated - Psychiatry: Anxiolytics/Hypnotics 2 Failed - 04/02/2024  2:45 PM      Failed - This refill cannot be delegated      Failed - Urine Drug Screen completed in last 360 days      Passed - Patient is not pregnant      Passed - Valid encounter within last 6 months    Recent Outpatient Visits           2 months ago Cellulitis of other specified site   [place] [facility] [place], Melanie DASEN, NP   2 months ago Bronchitis   [place] [facility] [place], [place] T, NP   5 months ago Type 2 diabetes mellitus with obesity   [place] [facility] [place], [place] T, NP   7 months ago Type 2 diabetes mellitus with obesity   [place] [facility] [place], [place] T, NP   8 months ago Type 2 diabetes mellitus with obesity   [place] [facility] [place], Melanie T, NP                 Requested Prescriptions  Pending Prescriptions Disp Refills   clonazePAM  (KLONOPIN ) 0.5 MG tablet [Pharmacy Med Name: CLONAZEPAM  0.5 MG TABLET] 30 tablet 2    Sig: TAKE 1 TABLET BY MOUTH DAILY AS NEEDED FOR ANXIETY. WILL NEED VISIT FOR FURTHER REFILLS.     Not Delegated - Psychiatry: Anxiolytics/Hypnotics 2 Failed - 04/02/2024  2:45 PM      Failed - This refill cannot be delegated      Failed - Urine Drug Screen completed in last 360 days      Passed - Patient is not pregnant      Passed - Valid encounter within last 6  months    Recent Outpatient Visits           2 months ago Cellulitis of other specified site   [place] [facility] [place], Melanie DASEN, NP   2 months ago Bronchitis   [place] [facility] [place], [place] T, NP   5 months ago Type 2 diabetes mellitus with obesity   [place] [facility] [place], [place] T, NP   7 months ago Type 2 diabetes mellitus with obesity   [place] [facility] [place], [place] T, NP   8 months ago Type 2 diabetes mellitus with obesity    [facility] [place], Melanie DASEN, NP

## 2024-04-18 ENCOUNTER — Ambulatory Visit: Payer: Self-pay | Admitting: Nurse Practitioner
